# Patient Record
Sex: Male | Born: 1935
Health system: Southern US, Community
[De-identification: ages and names within clinical notes are randomized; demographics above are authoritative.]

## PROBLEM LIST (undated history)

## (undated) DIAGNOSIS — R5383 Other fatigue: Secondary | ICD-10-CM

## (undated) DIAGNOSIS — I10 Essential (primary) hypertension: Secondary | ICD-10-CM

## (undated) DIAGNOSIS — D509 Iron deficiency anemia, unspecified: Secondary | ICD-10-CM

## (undated) DIAGNOSIS — J302 Other seasonal allergic rhinitis: Secondary | ICD-10-CM

## (undated) DIAGNOSIS — H905 Unspecified sensorineural hearing loss: Secondary | ICD-10-CM

## (undated) DIAGNOSIS — N400 Enlarged prostate without lower urinary tract symptoms: Secondary | ICD-10-CM

## (undated) DIAGNOSIS — M1990 Unspecified osteoarthritis, unspecified site: Secondary | ICD-10-CM

## (undated) DIAGNOSIS — N529 Male erectile dysfunction, unspecified: Secondary | ICD-10-CM

## (undated) DIAGNOSIS — C44212 Basal cell carcinoma of skin of right ear and external auricular canal: Secondary | ICD-10-CM

## (undated) DIAGNOSIS — N200 Calculus of kidney: Secondary | ICD-10-CM

## (undated) DIAGNOSIS — Z8659 Personal history of other mental and behavioral disorders: Secondary | ICD-10-CM

## (undated) DIAGNOSIS — Z860101 Personal history of adenomatous and serrated colon polyps: Secondary | ICD-10-CM

## (undated) DIAGNOSIS — I480 Paroxysmal atrial fibrillation: Secondary | ICD-10-CM

## (undated) DIAGNOSIS — Z86010 Personal history of colonic polyps: Secondary | ICD-10-CM

## (undated) HISTORY — DX: Personal history of colonic polyps: Z86.010

## (undated) HISTORY — DX: Unspecified sensorineural hearing loss: H90.5

## (undated) HISTORY — DX: Personal history of adenomatous and serrated colon polyps: Z86.0101

## (undated) HISTORY — DX: Other seasonal allergic rhinitis: J30.2

## (undated) HISTORY — DX: Essential (primary) hypertension: I10

## (undated) HISTORY — DX: Unspecified osteoarthritis, unspecified site: M19.90

## (undated) HISTORY — PX: TONSILLECTOMY AND ADENOIDECTOMY: SHX28

## (undated) HISTORY — PX: OTHER SURGICAL HISTORY: SHX169

## (undated) HISTORY — DX: Benign prostatic hyperplasia without lower urinary tract symptoms: N40.0

## (undated) HISTORY — DX: Personal history of other mental and behavioral disorders: Z86.59

## (undated) HISTORY — PX: CHOLECYSTECTOMY: SHX55

## (undated) HISTORY — DX: Iron deficiency anemia, unspecified: D50.9

## (undated) HISTORY — DX: Basal cell carcinoma of skin of right ear and external auricular canal: C44.212

## (undated) HISTORY — DX: Calculus of kidney: N20.0

## (undated) HISTORY — DX: Other fatigue: R53.83

## (undated) HISTORY — DX: Male erectile dysfunction, unspecified: N52.9

---

## 1999-07-04 ENCOUNTER — Encounter: Payer: Self-pay | Admitting: Internal Medicine

## 1999-07-04 ENCOUNTER — Encounter: Admission: RE | Admit: 1999-07-04 | Discharge: 1999-07-04 | Payer: Self-pay | Admitting: Internal Medicine

## 2002-07-21 ENCOUNTER — Ambulatory Visit (HOSPITAL_BASED_OUTPATIENT_CLINIC_OR_DEPARTMENT_OTHER): Admission: RE | Admit: 2002-07-21 | Discharge: 2002-07-21 | Payer: Self-pay | Admitting: Surgery

## 2006-08-24 ENCOUNTER — Encounter: Admission: RE | Admit: 2006-08-24 | Discharge: 2006-08-24 | Payer: Self-pay | Admitting: Internal Medicine

## 2006-08-31 ENCOUNTER — Encounter: Admission: RE | Admit: 2006-08-31 | Discharge: 2006-08-31 | Payer: Self-pay | Admitting: Internal Medicine

## 2015-07-16 ENCOUNTER — Encounter: Payer: Self-pay | Admitting: Cardiology

## 2015-07-16 ENCOUNTER — Ambulatory Visit (INDEPENDENT_AMBULATORY_CARE_PROVIDER_SITE_OTHER): Payer: Medicare Other | Admitting: Cardiology

## 2015-07-16 VITALS — BP 120/64 | HR 84 | Ht 73.0 in | Wt 208.0 lb

## 2015-07-16 DIAGNOSIS — I10 Essential (primary) hypertension: Secondary | ICD-10-CM | POA: Diagnosis not present

## 2015-07-16 DIAGNOSIS — I480 Paroxysmal atrial fibrillation: Secondary | ICD-10-CM | POA: Insufficient documentation

## 2015-07-16 DIAGNOSIS — R079 Chest pain, unspecified: Secondary | ICD-10-CM | POA: Insufficient documentation

## 2015-07-16 DIAGNOSIS — E7800 Pure hypercholesterolemia, unspecified: Secondary | ICD-10-CM | POA: Diagnosis not present

## 2015-07-16 DIAGNOSIS — M1990 Unspecified osteoarthritis, unspecified site: Secondary | ICD-10-CM | POA: Insufficient documentation

## 2015-07-16 DIAGNOSIS — R0789 Other chest pain: Secondary | ICD-10-CM

## 2015-07-16 DIAGNOSIS — E785 Hyperlipidemia, unspecified: Secondary | ICD-10-CM | POA: Insufficient documentation

## 2015-07-16 NOTE — Patient Instructions (Addendum)
Medication Instructions:  STOP ASPIRIN   Labwork: NONE  Testing/Procedures: Your physician has requested that you have an echocardiogram. Echocardiography is a painless test that uses sound waves to create images of your heart. It provides your doctor with information about the size and shape of your heart and how well your heart's chambers and valves are working. This procedure takes approximately one hour. There are no restrictions for this procedure.  Your physician has requested that you have a lexiscan myoview. For further information please visit [email or web address]. Please follow instruction sheet, as given.  A chest x-ray takes a picture of the organs and structures inside the chest, including the heart, lungs, and blood vessels. This test can show several things, including, whether the heart is enlarges; whether fluid is building up in the lungs; and whether pacemaker / defibrillator leads are still in place.  Follow-Up: Your physician recommends that you schedule a follow-up appointment in: [place] PA/NP  If you need a refill on your cardiac medications before your next appointment, please call your pharmacy.

## 2015-07-16 NOTE — Progress Notes (Signed)
Cardiology Office Note   Date:  07/16/2015   ID:  Kannen, Peirson 1934/11/10, MRN [id number]  PCP:  Lillia Mountain, MD  Cardiologist: Cassell Clement MD  Chief Complaint  Patient presents with  . New Evaluation    new onset a fib      History of Present Illness: Corey [name] is a 81 y.o. male who presents for cardiology evaluation.  He is seen by me for the first time today.  He is seen at the request of Dr. Kirby Funk.  The patient is being seen because of atrial fibrillation of unknown duration.  The patient was seen for a physical in November 2016 by Dr. Valentina Lucks.  No EKG was done at that time according to the patient, but no mention of atrial fibrillation or irregular pulse wasn't mentioned.  Subsequently had a nurse practitioner house call about a week ago who performed a physical examination on him at his home.  She noted an irregular pulse.  [name] office was notified and he was seen in Dr. Jone Baseman office on 07/14/15 by Ida Rogue, NP, who obtained an electrocardiogram and confirmed the presence of atrial fibrillation. The patient himself is really not aware of his heart rate.  He does not know how long he may have been in the atrial fibrillation.  He began experiencing fatigue several years ago and because of that stop playing golf about 2 years ago.  He would give out after playing about 12 holes of golf.  He has not had any symptoms of congestive heart failure.  He sleeps on one pillow.  He does not have any difficulty climbing stairs.  He has had a sense of vague chest tightness at times.  There is no radiation to the arms.  The past 6 months the patient has also had occasional night sweats which are new. The patient has had some aching in the muscles of his legs.  He is on statin therapy. He has not had any TIA symptoms.  He has not had any dizziness or syncope.    Past Medical History  Diagnosis Date  . A-fib (HCC)   . SOB (shortness of  breath)   . Fatigue   . Hypertension   . BPH (benign prostatic hyperplasia)   . ED (erectile dysfunction)   . Nephrolithiasis   . Hx of major depression   . Iron deficiency anemia   . Hx of adenomatous colonic polyps   . Basal cell carcinoma of auricle of right ear   . Hearing loss, sensorineural   . DJD (degenerative joint disease)     left  . Seasonal allergic rhinitis   . Irregular heartbeat     Past Surgical History  Procedure Laterality Date  . Tonsillectomy and adenoidectomy    . Herniorraphy Bilateral     had surgery x2 on the left  and right  . Cholecystectomy    . Colonscopy       Current Outpatient Prescriptions  Medication Sig Dispense Refill  . amLODipine-atorvastatin (CADUET) 5-20 MG tablet Take 1 tablet by mouth daily.    [name] apixaban (ELIQUIS) 5 MG TABS tablet Take 5 mg by mouth 2 (two) times daily.    [name] atorvastatin (LIPITOR) 20 MG tablet Take 20 mg by mouth daily.    . cholecalciferol (VITAMIN D) 1000 units tablet Take 1,000 Units by mouth daily.    . Multiple Vitamin (MULTIVITAMIN) tablet Take 1 tablet by mouth daily. Take once a day  No current facility-administered medications for this visit.    Allergies:   Review of patient's allergies indicates no known allergies.    Social History:  The patient  reports that he quit smoking about 55 years ago. His smoking use included Cigarettes. He does not have any smokeless tobacco history on file. He reports that he does not drink alcohol.   Family History:  The patient's family history includes Alcoholism in his daughter; Arrhythmia in his daughter; CAD in his mother; Cancer - Prostate in his brother; Healthy in his daughter and son; Heart attack in his father; Heart disease in his mother. history father died of a heart attack at age 102.  Mother died of postoperative hemorrhage after heart surgery at age 44   ROS:  Please see the history of present illness.   Otherwise, review of systems are positive for  none.   All other systems are reviewed and negative.    PHYSICAL EXAM: VS:  BP 120/64 mmHg  Pulse 84  Ht 6\' 1"  (1.854 m)  Wt 208 lb (94.348 kg)  BMI 27.45 kg/m2 , BMI Body mass index is 27.45 kg/(m^2). GEN: Well nourished, well developed, in no acute distress HEENT: normal Neck: no JVD, carotid bruits, or masses Cardiac: Irregularly irregular no murmurs, rubs, or gallops,no edema  Respiratory:  clear to auscultation bilaterally, normal work of breathing GI: soft, nontender, nondistended, + BS MS: no deformity or atrophy Skin: warm and dry, no rash Neuro:  Strength and sensation are intact Psych: euthymic mood, full affect   EKG:  EKG is ordered today. The ekg ordered today demonstrates atrial fibrillation with controlled ventricular response.  Heart rate is 85 bpm.  No ischemic changes.   Recent Labs: No results found for requested labs within last 365 days.    Lipid Panel No results found for: CHOL, TRIG, HDL, CHOLHDL, VLDL, LDLCALC, LDLDIRECT    Wt Readings from Last 3 Encounters:  07/16/15 208 lb (94.348 kg)       ASSESSMENT AND PLAN:  1.  Atrial fibrillation of unknown duration.  Possibly months.  Essentially asymptomatic and patient really not aware of his heart rhythm.  However he has had some decrease in his overall sense of well-being and stamina over the past year or 2. This patients CHA2DS2-VASc Score is 3 for hypertension and age. Above score calculated as 1 point each if present [CHF, HTN, DM, Vascular=MI/PAD/Aortic Plaque, Age if 65-74, or Male] Above score calculated as 2 points each if present [Age > 75, or Stroke/TIA/TE]  2.  Occasional night sweats over the past 6 months, undetermined etiology 3.  Essential hypertension 4.  History of hypercholesterolemia   Current medicines are reviewed at length with the patient today.  The patient does not have concerns regarding medicines.  The following changes have been made:  no change  Labs/ tests  ordered today include:   Orders Placed This Encounter  Procedures  . Myocardial Perfusion Imaging  . EKG 12-Lead  . ECHOCARDIOGRAM COMPLETE     Disposition:   We will continue him on the Apixaban 5 mg twice a day that has already been started.  He will stop his baby aspirin.  We will get a chest x-ray to evaluate heart size and incidentally to evaluate his complaint of night sweats.  We will have him return for a Lexiscan Myoview to evaluate his chest discomfort and rule out ischemic disease particularly in view of his family history.  He will also get a two-dimensional echocardiogram.  At this point, we will pursue a strategy of rate control and anticoagulation.  His rate is well controlled without requiring the addition of any beta blocker or calcium channel blocker at this point. He apparently is on both plain  atorvastatin 20 mg daily and Caduet 5-20 mg daily.  He does complain of some leg cramping and questionable muscle aching.  I will defer to Dr. Kirby Funk as to whether his statin therapy could be reduced. Following my retirement the patient will be seen by Dr. Rennis [name] in mid-March. Many thanks for the opportunity to see this pleasant gentleman with you.  Corey Schwalbe MD 07/16/2015 5:52 PM    [facility] [address] [place], [place], [place]  [address] Phone: [phone]; Fax: [phone]

## 2015-07-22 ENCOUNTER — Ambulatory Visit
Admission: RE | Admit: 2015-07-22 | Discharge: 2015-07-22 | Disposition: A | Discharge status: Home / Self Care | Payer: Medicare Other | Source: Ambulatory Visit | Attending: Cardiology | Admitting: Cardiology

## 2015-07-22 DIAGNOSIS — I480 Paroxysmal atrial fibrillation: Secondary | ICD-10-CM

## 2015-07-26 ENCOUNTER — Telehealth (HOSPITAL_COMMUNITY): Payer: Self-pay | Admitting: *Deleted

## 2015-07-26 NOTE — Telephone Encounter (Signed)
Patient given detailed instructions per Myocardial Perfusion Study Information Sheet for the test on 3/1/17Patient notified to arrive 15 minutes early and that it is imperative to arrive on time for appointment to keep from having the test rescheduled.  If you need to cancel or reschedule your appointment, please call the office within 24 hours of your appointment. Failure to do so may result in a cancellation of your appointment, and a $50 no show fee. Patient verbalized understanding. Nikholas Geffre J Cheryal Salas, RN  

## 2015-07-28 ENCOUNTER — Ambulatory Visit (HOSPITAL_BASED_OUTPATIENT_CLINIC_OR_DEPARTMENT_OTHER): Payer: Medicare Other

## 2015-07-28 ENCOUNTER — Ambulatory Visit (HOSPITAL_COMMUNITY): Payer: Medicare Other | Attending: Cardiology

## 2015-07-28 ENCOUNTER — Other Ambulatory Visit: Payer: Self-pay

## 2015-07-28 DIAGNOSIS — I4891 Unspecified atrial fibrillation: Secondary | ICD-10-CM | POA: Diagnosis present

## 2015-07-28 DIAGNOSIS — I358 Other nonrheumatic aortic valve disorders: Secondary | ICD-10-CM | POA: Insufficient documentation

## 2015-07-28 DIAGNOSIS — I340 Nonrheumatic mitral (valve) insufficiency: Secondary | ICD-10-CM | POA: Insufficient documentation

## 2015-07-28 DIAGNOSIS — R5383 Other fatigue: Secondary | ICD-10-CM | POA: Insufficient documentation

## 2015-07-28 DIAGNOSIS — I480 Paroxysmal atrial fibrillation: Secondary | ICD-10-CM | POA: Diagnosis not present

## 2015-07-28 DIAGNOSIS — R0789 Other chest pain: Secondary | ICD-10-CM | POA: Insufficient documentation

## 2015-07-28 DIAGNOSIS — I119 Hypertensive heart disease without heart failure: Secondary | ICD-10-CM | POA: Diagnosis not present

## 2015-07-28 DIAGNOSIS — R079 Chest pain, unspecified: Secondary | ICD-10-CM

## 2015-07-28 DIAGNOSIS — I071 Rheumatic tricuspid insufficiency: Secondary | ICD-10-CM | POA: Insufficient documentation

## 2015-07-28 LAB — MYOCARDIAL PERFUSION IMAGING
CHL CUP NUCLEAR SSS: 8
CSEPPHR: 100 {beats}/min
LHR: 0.47
LV dias vol: 112 mL
LVSYSVOL: 56 mL
NUC STRESS TID: 1
Rest HR: 77 {beats}/min
SDS: 1
SRS: 7

## 2015-07-28 MED ORDER — TECHNETIUM TC 99M SESTAMIBI GENERIC - CARDIOLITE
32.6000 | Freq: Once | INTRAVENOUS | Status: AC | PRN
  Administered 2015-07-28: 33 via INTRAVENOUS

## 2015-07-28 MED ORDER — REGADENOSON 0.4 MG/5ML IV SOLN
0.4000 mg | Freq: Once | INTRAVENOUS | Status: AC
  Administered 2015-07-28: 0.4 mg via INTRAVENOUS

## 2015-07-28 MED ORDER — TECHNETIUM TC 99M SESTAMIBI GENERIC - CARDIOLITE
10.6000 | Freq: Once | INTRAVENOUS | Status: AC | PRN
  Administered 2015-07-28: 11 via INTRAVENOUS

## 2015-08-02 ENCOUNTER — Other Ambulatory Visit (HOSPITAL_COMMUNITY): Payer: Medicare Other

## 2015-08-02 ENCOUNTER — Encounter (HOSPITAL_COMMUNITY): Payer: Medicare Other

## 2015-08-11 ENCOUNTER — Ambulatory Visit (INDEPENDENT_AMBULATORY_CARE_PROVIDER_SITE_OTHER): Payer: Medicare Other | Admitting: Internal Medicine

## 2015-08-11 ENCOUNTER — Encounter: Payer: Self-pay | Admitting: Internal Medicine

## 2015-08-11 VITALS — BP 122/64 | HR 75 | Ht 72.0 in | Wt 209.0 lb

## 2015-08-11 DIAGNOSIS — I10 Essential (primary) hypertension: Secondary | ICD-10-CM

## 2015-08-11 DIAGNOSIS — Z01812 Encounter for preprocedural laboratory examination: Secondary | ICD-10-CM

## 2015-08-11 DIAGNOSIS — R079 Chest pain, unspecified: Secondary | ICD-10-CM

## 2015-08-11 DIAGNOSIS — R0789 Other chest pain: Secondary | ICD-10-CM

## 2015-08-11 DIAGNOSIS — R5383 Other fatigue: Secondary | ICD-10-CM

## 2015-08-11 DIAGNOSIS — D689 Coagulation defect, unspecified: Secondary | ICD-10-CM | POA: Diagnosis not present

## 2015-08-11 DIAGNOSIS — I4819 Other persistent atrial fibrillation: Secondary | ICD-10-CM

## 2015-08-11 DIAGNOSIS — I480 Paroxysmal atrial fibrillation: Secondary | ICD-10-CM | POA: Diagnosis not present

## 2015-08-11 DIAGNOSIS — I481 Persistent atrial fibrillation: Secondary | ICD-10-CM

## 2015-08-11 LAB — CBC
HEMATOCRIT: 41.5 % (ref 39.0–52.0)
HEMOGLOBIN: 13.7 g/dL (ref 13.0–17.0)
MCH: 28.7 pg (ref 26.0–34.0)
MCHC: 33 g/dL (ref 30.0–36.0)
MCV: 87 fL (ref 78.0–100.0)
MPV: 8.7 fL (ref 8.6–12.4)
Platelets: 317 10*3/uL (ref 150–400)
RBC: 4.77 MIL/uL (ref 4.22–5.81)
RDW: 14.8 % (ref 11.5–15.5)
WBC: 7.4 10*3/uL (ref 4.0–10.5)

## 2015-08-11 LAB — BASIC METABOLIC PANEL
BUN: 17 mg/dL (ref 7–25)
CHLORIDE: 101 mmol/L (ref 98–110)
CO2: 31 mmol/L (ref 20–31)
CREATININE: 0.91 mg/dL (ref 0.70–1.18)
Calcium: 9.5 mg/dL (ref 8.6–10.3)
GLUCOSE: 122 mg/dL — AB (ref 65–99)
Potassium: 4.2 mmol/L (ref 3.5–5.3)
Sodium: 139 mmol/L (ref 135–146)

## 2015-08-11 NOTE — Patient Instructions (Addendum)
Your physician has recommended that you have a Cardioversion (DCCV) - 3/17, 3/20. Electrical Cardioversion uses a jolt of electricity to your heart either through paddles or wired patches attached to your chest. This is a controlled, usually prescheduled, procedure. Defibrillation is done under light anesthesia in the hospital, and you usually go home the day of the procedure. This is done to get your heart back into a normal rhythm. You are not awake for the procedure. Please see the instruction sheet given to you today.  -- you need to have lab work done prior to the procedure (no more than 1 week prior to your procedure).   Your physician recommends that you schedule a follow-up appointment in 2-3 weeks after your cardioversion with Dr. Debara Pickett  STOP atorvastatin  CONTINUE amlodipine-atorvastatin (caduet)

## 2015-08-11 NOTE — Progress Notes (Signed)
Cardiology Office Note   Date:  08/11/2015   ID:  Abdul, Shiver 04-Dec-1934, MRN [id number]  PCP:  Lillia Mountain, MD  Cardiologist: Cassell Clement MD  Chief Complaint  Patient presents with  . New Evaluation    patient of Dr. Patty Sermons - f/up echo/stress/cxr. NO COMPLAINTS.      History of Present Illness: TORRIN KALICKI is a 81 y.o. male who presents for cardiology evaluation.  He is seen by me for the first time today.  He is seen at the request of Dr. Kirby Funk.  The patient is being seen because of atrial fibrillation of unknown duration.  The patient was seen for a physical in November 2016 by Dr. Valentina Lucks.  No EKG was done at that time according to the patient, but no mention of atrial fibrillation or irregular pulse wasn't mentioned.  Subsequently had a nurse practitioner house call about a week ago who performed a physical examination on him at his home.  She noted an irregular pulse.  [name] office was notified and he was seen in Dr. Jone Baseman office on 07/14/15 by Ida Rogue, NP, who obtained an electrocardiogram and confirmed the presence of atrial fibrillation. The patient himself is really not aware of his heart rate.  He does not know how long he may have been in the atrial fibrillation.  He began experiencing fatigue several years ago and because of that stop playing golf about 2 years ago.  He would give out after playing about 12 holes of golf.  He has not had any symptoms of congestive heart failure.  He sleeps on one pillow.  He does not have any difficulty climbing stairs.  He has had a sense of vague chest tightness at times.  There is no radiation to the arms.  The past 6 months the patient has also had occasional night sweats which are new. The patient has had some aching in the muscles of his legs.  He is on statin therapy. He has not had any TIA symptoms.  He has not had any dizziness or syncope.  Mr. Giannini is seen in the  office today is a new patient to me however he was recently seen by Dr. Patty Sermons. Unfortunately Dr. Patty Sermons just retired and as Mr. Dixon wife is my patient, he is kindly requested my services. This or Hallak says over the past year he's had some worsening fatigue and decreased energy levels. In fact she's given up a lot of activities including golf which he enjoyed playing. He thought it was just related to being older. He also stopped doing a lot of lawn work and other activities. Recently had a home physical where he was found to be out of rhythm and then had an EKG which demonstrated atrial fibrillation. He was started on Eliquis and was seen by Dr. Patty Sermons. An echocardiogram and stress test were ordered. Both studies showed low normal LVEF of 50-55% however the nuclear stress test showed no ischemia. He had only mild left atrial enlargement on his echocardiogram. Apparently Dr. Patty Sermons had discussed the possibility of a cardioversion with him but had recommended at least 1 month of anticoagulation. Today is his 28th day of Eliquis. He has not missed any doses. EKG shows persistent atrial fibrillation.  Past Medical History  Diagnosis Date  . A-fib (HCC)   . SOB (shortness of breath)   . Fatigue   . Hypertension   . BPH (benign prostatic hyperplasia)   . ED (erectile  dysfunction)   . Nephrolithiasis   . Hx of major depression   . Iron deficiency anemia   . Hx of adenomatous colonic polyps   . Basal cell carcinoma of auricle of right ear   . Hearing loss, sensorineural   . DJD (degenerative joint disease)     left  . Seasonal allergic rhinitis   . Irregular heartbeat     Past Surgical History  Procedure Laterality Date  . Tonsillectomy and adenoidectomy    . Herniorraphy Bilateral     had surgery x2 on the left  and right  . Cholecystectomy    . Colonscopy       Current Outpatient Prescriptions  Medication Sig Dispense Refill  . amLODipine-atorvastatin  (CADUET) 5-20 MG tablet Take 1 tablet by mouth daily.    [name] apixaban (ELIQUIS) 5 MG TABS tablet Take 5 mg by mouth 2 (two) times daily.    . cholecalciferol (VITAMIN D) 1000 units tablet Take 1,000 Units by mouth daily.    . Multiple Vitamin (MULTIVITAMIN) tablet Take 1 tablet by mouth daily. Take once a day     No current facility-administered medications for this visit.    Allergies:   Review of patient's allergies indicates no known allergies.    Social History:  The patient  reports that he quit smoking about 55 years ago. His smoking use included Cigarettes. He does not have any smokeless tobacco history on file. He reports that he does not drink alcohol.   Family History:  The patient's family history includes Alcoholism in his daughter; Arrhythmia in his daughter; CAD in his mother; Cancer - Prostate in his brother; Healthy in his daughter and son; Heart attack in his father; Heart disease in his mother. history father died of a heart attack at age 16.  Mother died of postoperative hemorrhage after heart surgery at age 40   ROS:  Please see the history of present illness.   Otherwise, review of systems are positive for none.   All other systems are reviewed and negative.    PHYSICAL EXAM: VS:  BP 122/64 mmHg  Pulse 75  Ht 6' (1.829 m)  Wt 209 lb (94.802 kg)  BMI 28.34 kg/m2 , BMI Body mass index is 28.34 kg/(m^2). GEN: Well nourished, well developed, in no acute distress HEENT: normal Neck: no JVD, carotid bruits, or masses Cardiac: Irregularly irregular no murmurs, rubs, or gallops,no edema  Respiratory:  clear to auscultation bilaterally, normal work of breathing GI: soft, nontender, nondistended, + BS MS: no deformity or atrophy Skin: warm and dry, no rash Neuro:  Strength and sensation are intact Psych: euthymic mood, full affect   EKG:  Atrial fibrillation at 94  Recent Labs: No results found for requested labs within last 365 days.    Lipid Panel No results  found for: CHOL, TRIG, HDL, CHOLHDL, VLDL, LDLCALC, LDLDIRECT    Wt Readings from Last 3 Encounters:  08/11/15 209 lb (94.802 kg)  07/28/15 208 lb (94.348 kg)  07/16/15 208 lb (94.348 kg)    ASSESSMENT AND PLAN:  1.  Atrial fibrillation of unknown duration.  Possibly months.  Essentially asymptomatic and patient really not aware of his heart rhythm.  However he has had some decrease in his overall sense of well-being and stamina over the past year or 2. This patients CHA2DS2-VASc Score is 3 for hypertension and age. Above score calculated as 1 point each if present [CHF, HTN, DM, Vascular=MI/PAD/Aortic Plaque, Age if 30-74, or Male] Above score  calculated as 2 points each if present [Age > 75, or Stroke/TIA/TE] 2.  Occasional night sweats over the past 6 months, undetermined etiology 3.  Essential hypertension 4.  History of hypercholesterolemia   Orders Placed This Encounter  Procedures  . ELECTRICAL CARDIOVERSION  . CBC  . Basic metabolic panel  . APTT  . Protime-INR  . TSH  . EKG 12-Lead   PLAN:    1. Mr. Visaya is in persistent atrial fibrillation and he seems to be symptomatic with this. He's given up a lot of activities it would normally do which I think is related to his A. fib. His nuclear stress test is reassuring and is nonischemic however he does have low normal LVEF both on echo and nuclear stress testing. This is likely related to persistent A. fib. I do think he deserves an opportunity to get back into sinus rhythm. He's been on Eliquis now for one month and is interested in a cardioversion. I did explain the risks, benefits and alternatives for that procedure today with him in the office and he is agreeable to proceed. We'll try to schedule that later this week. He is advised not to miss any doses of Eliquis.  Follow-up with me after cardioversion.  Chrystie Nose, MD, [facility] Attending Cardiologist [facility]  08/11/2015 6:22 PM

## 2015-08-12 ENCOUNTER — Telehealth: Payer: Self-pay | Admitting: Internal Medicine

## 2015-08-12 LAB — PROTIME-INR
INR: 1.1 (ref <1.50)
Prothrombin Time: 14.3 seconds (ref 11.6–15.2)

## 2015-08-12 LAB — TSH: TSH: 1.92 mIU/L (ref 0.40–4.50)

## 2015-08-12 LAB — APTT: aPTT: 33 seconds (ref 24–37)

## 2015-08-12 NOTE — Telephone Encounter (Signed)
Please call,question about his medicine. °

## 2015-08-12 NOTE — Telephone Encounter (Signed)
I will straighten it out tomorrow at his cardioversion. Please advise him to bring his medication bottles to the hospital.  Dr. Lemmie Evens

## 2015-08-12 NOTE — Telephone Encounter (Signed)
Spoke to patient   patient states he checked medication against the AVS MEDICATION LIST  Patient states he is taking Amlodipine- benazepril 5-20 mg not  Amlodipine-atorvastatin 5-20 mg Patient wanted to know if these medications were the same.  RN informed patient the medication were not the same.  It seems the incorrect medication may have been listed at a prior office visit.   Patient probably does not need to change current medications -Amoldipine-beneazepril--   Will defer to Dr Debara Pickett, if patient  needs to have medication change.

## 2015-08-12 NOTE — Telephone Encounter (Signed)
PATIENT VERBALIZED UNDERSTANDING 

## 2015-08-13 ENCOUNTER — Ambulatory Visit (HOSPITAL_COMMUNITY): Payer: Medicare Other | Admitting: Anesthesiology

## 2015-08-13 ENCOUNTER — Encounter (HOSPITAL_COMMUNITY): Payer: Self-pay

## 2015-08-13 ENCOUNTER — Ambulatory Visit (HOSPITAL_COMMUNITY)
Admission: RE | Admit: 2015-08-13 | Discharge: 2015-08-13 | Disposition: A | Discharge status: Home / Self Care | Payer: Medicare Other | Source: Ambulatory Visit | Attending: Internal Medicine | Admitting: Internal Medicine

## 2015-08-13 ENCOUNTER — Encounter (HOSPITAL_COMMUNITY)
Admission: RE | Disposition: A | Discharge status: Home / Self Care | Payer: Self-pay | Source: Ambulatory Visit | Attending: Internal Medicine

## 2015-08-13 DIAGNOSIS — Z87891 Personal history of nicotine dependence: Secondary | ICD-10-CM | POA: Diagnosis not present

## 2015-08-13 DIAGNOSIS — I10 Essential (primary) hypertension: Secondary | ICD-10-CM | POA: Insufficient documentation

## 2015-08-13 DIAGNOSIS — Z7901 Long term (current) use of anticoagulants: Secondary | ICD-10-CM | POA: Insufficient documentation

## 2015-08-13 DIAGNOSIS — I4891 Unspecified atrial fibrillation: Secondary | ICD-10-CM | POA: Insufficient documentation

## 2015-08-13 DIAGNOSIS — I481 Persistent atrial fibrillation: Secondary | ICD-10-CM

## 2015-08-13 DIAGNOSIS — Z79899 Other long term (current) drug therapy: Secondary | ICD-10-CM | POA: Insufficient documentation

## 2015-08-13 DIAGNOSIS — I4819 Other persistent atrial fibrillation: Secondary | ICD-10-CM | POA: Insufficient documentation

## 2015-08-13 HISTORY — PX: CARDIOVERSION: SHX1299

## 2015-08-13 SURGERY — CARDIOVERSION
Anesthesia: General

## 2015-08-13 MED ORDER — SODIUM CHLORIDE 0.9 % IV SOLN
INTRAVENOUS | Status: DC
Start: 2015-08-13 — End: 2015-08-13
  Administered 2015-08-13: 13:00:00 via INTRAVENOUS

## 2015-08-13 MED ORDER — LIDOCAINE HCL (CARDIAC) 20 MG/ML IV SOLN
INTRAVENOUS | Status: DC | PRN
  Administered 2015-08-13: 60 mg via INTRATRACHEAL

## 2015-08-13 MED ORDER — PROPOFOL 10 MG/ML IV BOLUS
INTRAVENOUS | Status: DC | PRN
  Administered 2015-08-13: 10 mg via INTRAVENOUS
  Administered 2015-08-13: 60 mg via INTRAVENOUS

## 2015-08-13 NOTE — Anesthesia Postprocedure Evaluation (Signed)
Anesthesia Post Note  Patient: Corey [name]  Procedure(s) Performed: Procedure(s) (LRB): CARDIOVERSION (N/A)  Patient location during evaluation: PACU Anesthesia Type: General Level of consciousness: sedated and patient cooperative Pain management: pain level controlled Vital Signs Assessment: post-procedure vital signs reviewed and stable Respiratory status: spontaneous breathing Cardiovascular status: stable Anesthetic complications: no    Last Vitals:  Filed Vitals:   08/13/15 1440 08/13/15 1450  BP: 130/71 130/71  Pulse: 66 71  Temp:    Resp: 18 19    Last Pain: There were no vitals filed for this visit.               Nolon Nations

## 2015-08-13 NOTE — Anesthesia Preprocedure Evaluation (Signed)
Anesthesia Evaluation  Patient identified by MRN, date of birth, ID band Patient awake    Reviewed: Allergy & Precautions, NPO status , Patient's Chart, lab work & pertinent test results  Airway Mallampati: II  TM Distance: >3 FB Neck ROM: Full    Dental no notable dental hx.    Pulmonary shortness of breath, former smoker,    Pulmonary exam normal breath sounds clear to auscultation       Cardiovascular hypertension, Pt. on medications Normal cardiovascular exam Rhythm:Regular Rate:Normal     Neuro/Psych negative neurological ROS  negative psych ROS   GI/Hepatic negative GI ROS, Neg liver ROS,   Endo/Other  negative endocrine ROS  Renal/GU Renal disease  negative genitourinary   Musculoskeletal negative musculoskeletal ROS (+) Arthritis ,   Abdominal   Peds negative pediatric ROS (+)  Hematology  (+) anemia ,   Anesthesia Other Findings   Reproductive/Obstetrics negative OB ROS                             Anesthesia Physical Anesthesia Plan  ASA: III  Anesthesia Plan: General   Post-op Pain Management:    Induction: Intravenous  Airway Management Planned: Mask  Additional Equipment:   Intra-op Plan:   Post-operative Plan:   Informed Consent: I have reviewed the patients History and Physical, chart, labs and discussed the procedure including the risks, benefits and alternatives for the proposed anesthesia with the patient or authorized representative who has indicated his/her understanding and acceptance.   Dental advisory given  Plan Discussed with: CRNA  Anesthesia Plan Comments:         Anesthesia Quick Evaluation

## 2015-08-13 NOTE — CV Procedure (Signed)
    CARDIOVERSION NOTE  Procedure: Electrical Cardioversion Indications:  Atrial Fibrillation  Procedure Details:  Consent: Risks of procedure as well as the alternatives and risks of each were explained to the (patient/caregiver).  Consent for procedure obtained.  Time Out: Verified patient identification, verified procedure, site/side was marked, verified correct patient position, special equipment/implants available, medications/allergies/relevent history reviewed, required imaging and test results available.  Performed  Patient placed on cardiac monitor, pulse oximetry, supplemental oxygen as necessary.  Sedation given: Propofol per anesthesia Pacer pads placed anterior and posterior chest.  Cardioverted 2 time(s).  Cardioverted at 150J and 200J biphasic.  Impression: Findings: Post procedure EKG shows: NSR Complications: None Patient did tolerate procedure well.  Plan: 1. Successful DCCV to normal sinus rhythm after 2 shocks. 2. Continue Eliquis.  Time Spent Directly with the Patient:  30 minutes   Pixie Casino, MD, [facility] Attending Cardiologist [place] 08/13/2015, 2:18 PM

## 2015-08-13 NOTE — Discharge Instructions (Signed)
Electrical Cardioversion Electrical cardioversion is the delivery of a jolt of electricity to change the rhythm of the heart. Sticky patches or metal paddles are placed on the chest to deliver the electricity from a device. This is done to restore a normal rhythm. A rhythm that is too fast or not regular keeps the heart from pumping well. Electrical cardioversion is done in an emergency if:   There is low or no blood pressure as a result of the heart rhythm.   Normal rhythm must be restored as fast as possible to protect the brain and heart from further damage.   It may save a life. Cardioversion may be done for heart rhythms that are not immediately life threatening, such as atrial fibrillation or flutter, in which:   The heart is beating too fast or is not regular.   Medicine to change the rhythm has not worked.   It is safe to wait in order to allow time for preparation.  Symptoms of the abnormal rhythm are bothersome.  The risk of stroke and other serious problems can be reduced. LET [facility] CARE PROVIDER KNOW ABOUT:   Any allergies you have.  All medicines you are taking, including vitamins, herbs, eye drops, creams, and over-the-counter medicines.  Previous problems you or members of your family have had with the use of anesthetics.   Any blood disorders you have.   Previous surgeries you have had.   Medical conditions you have. RISKS AND COMPLICATIONS  Generally, this is a safe procedure. However, problems can occur and include:   Breathing problems related to the anesthetic used.  A blood clot that breaks free and travels to other parts of your body. This could cause a stroke or other problems. The risk of this is lowered by use of blood-thinning medicine (anticoagulant) prior to the procedure.  Cardiac arrest (rare). BEFORE THE PROCEDURE   You may have tests to detect blood clots in your heart and to evaluate heart function.  You may start taking  anticoagulants so your blood does not clot as easily.   Medicines may be given to help stabilize your heart rate and rhythm. PROCEDURE  You will be given medicine through an IV tube to reduce discomfort and make you sleepy (sedative).   An electrical shock will be delivered. AFTER THE PROCEDURE Your heart rhythm will be watched to make sure it does not change.    This information is not intended to replace advice given to you by your health care provider. Make sure you discuss any questions you have with your health care provider.   Document Released: 05/05/2002 Document Revised: 06/05/2014 Document Reviewed: 11/27/2012 Elsevier Interactive Patient Education 2016 [place] Monitored anesthesia care is an anesthesia service for a medical procedure. Anesthesia is the loss of the ability to feel pain. It is produced by medicines called anesthetics. It may affect a small area of your body (local anesthesia), a large area of your body (regional anesthesia), or your entire body (general anesthesia). The need for monitored anesthesia care depends your procedure, your condition, and the potential need for regional or general anesthesia. It is often provided during procedures where:   General anesthesia may be needed if there are complications. This is because you need special care when you are under general anesthesia.   You will be under local or regional anesthesia. This is so that you are able to have higher levels of anesthesia if needed.   You will  receive calming medicines (sedatives). This is especially the case if sedatives are given to put you in a semi-conscious state of relaxation (deep sedation). This is because the amount of sedative needed to produce this state can be hard to predict. Too much of a sedative can produce general anesthesia. Monitored anesthesia care is performed by one or more health care providers who have special training in all  types of anesthesia. You will need to meet with these health care providers before your procedure. During this meeting, they will ask you about your medical history. They will also give you instructions to follow. (For example, you will need to stop eating and drinking before your procedure. You may also need to stop or change medicines you are taking.) During your procedure, your health care providers will stay with you. They will:   Watch your condition. This includes watching your blood pressure, breathing, and level of pain.   Diagnose and treat problems that occur.   Give medicines if they are needed. These may include calming medicines (sedatives) and anesthetics.   Make sure you are comfortable.  Having monitored anesthesia care does not necessarily mean that you will be under anesthesia. It does mean that your health care providers will be able to manage anesthesia if you need it or if it occurs. It also means that you will be able to have a different type of anesthesia than you are having if you need it. When your procedure is complete, your health care providers will continue to watch your condition. They will make sure any medicines wear off before you are allowed to go home.    This information is not intended to replace advice given to you by your health care provider. Make sure you discuss any questions you have with your health care provider.   Document Released: 02/08/2005 Document Revised: 06/05/2014 Document Reviewed: 06/26/2012 Elsevier Interactive Patient Education [employer].

## 2015-08-13 NOTE — H&P (Signed)
     INTERVAL PROCEDURE H&P  History and Physical Interval Note:  08/13/2015 1:45 PM  Corey [name] has presented today for their planned procedure. The various methods of treatment have been discussed with the patient and family. After consideration of risks, benefits and other options for treatment, the patient has consented to the procedure.  The patients' outpatient history has been reviewed, patient examined, and no change in status from most recent office note within the past 30 days. I have reviewed the patients' chart and labs and will proceed as planned. Questions were answered to the patient's satisfaction.   Pixie Casino, MD, [facility] Attending Cardiologist [place] 08/13/2015, 1:45 PM

## 2015-08-13 NOTE — Transfer of Care (Signed)
Immediate Anesthesia Transfer of Care Note  Patient: Corey [name]  Procedure(s) Performed: Procedure(s): CARDIOVERSION (N/A)  Patient Location: Endoscopy Unit  Anesthesia Type:General  Level of Consciousness: awake, alert  and oriented  Airway & Oxygen Therapy: Patient Spontanous Breathing  Post-op Assessment: Report given to RN, Post -op Vital signs reviewed and stable and Patient moving all extremities X 4  Post vital signs: Reviewed and stable  Last Vitals:  Filed Vitals:   08/13/15 1320  BP: 145/94  Pulse: 79  Temp: 36.4 C  Resp: 21    Complications: No apparent anesthesia complications

## 2015-08-13 NOTE — Anesthesia Procedure Notes (Signed)
Date/Time: 08/13/2015 2:00 PM Performed by: Merrilyn Puma B Pre-anesthesia Checklist: Patient identified, Emergency Drugs available, Suction available, Patient being monitored and Timeout performed Patient Re-evaluated:Patient Re-evaluated prior to inductionOxygen Delivery Method: Circle system utilized Preoxygenation: Pre-oxygenation with 100% oxygen Intubation Type: IV induction Ventilation: Mask ventilation without difficulty Dental Injury: Teeth and Oropharynx as per pre-operative assessment

## 2015-08-14 ENCOUNTER — Encounter (HOSPITAL_COMMUNITY): Payer: Self-pay | Admitting: Internal Medicine

## 2015-08-27 ENCOUNTER — Encounter: Payer: Self-pay | Admitting: Internal Medicine

## 2015-08-27 ENCOUNTER — Ambulatory Visit (INDEPENDENT_AMBULATORY_CARE_PROVIDER_SITE_OTHER): Payer: Medicare Other | Admitting: Internal Medicine

## 2015-08-27 VITALS — BP 124/74 | HR 52 | Ht 72.0 in | Wt 207.5 lb

## 2015-08-27 DIAGNOSIS — I10 Essential (primary) hypertension: Secondary | ICD-10-CM

## 2015-08-27 DIAGNOSIS — I481 Persistent atrial fibrillation: Secondary | ICD-10-CM

## 2015-08-27 DIAGNOSIS — I480 Paroxysmal atrial fibrillation: Secondary | ICD-10-CM

## 2015-08-27 DIAGNOSIS — Z7901 Long term (current) use of anticoagulants: Secondary | ICD-10-CM | POA: Diagnosis not present

## 2015-08-27 DIAGNOSIS — I4819 Other persistent atrial fibrillation: Secondary | ICD-10-CM

## 2015-08-27 NOTE — Patient Instructions (Signed)
   Your physician wants you to follow-up in: [place]. You will receive a reminder letter in the mail two months in advance. If you don't receive a letter, please call our office to schedule the follow-up appointment.

## 2015-08-27 NOTE — Progress Notes (Signed)
Cardiology Office Note   Date:  08/27/2015   ID:  Messiah, Lozinski 30-Jan-1935, MRN [id number]  PCP:  Lillia Mountain, MD  Cardiologist: Cassell Clement MD  Chief Complaint  Patient presents with  . Follow-up    no chest pain, no shortness of breath,no edema, no pain or cramping in legs, no lightheadedness or dizziness, fatigue-after activity      History of Present Illness: JERMARIUS KOSA is a 81 y.o. male who presents for cardiology evaluation.  He is seen by me for the first time today.  He is seen at the request of Dr. Kirby Funk.  The patient is being seen because of atrial fibrillation of unknown duration.  The patient was seen for a physical in November 2016 by Dr. Valentina Lucks.  No EKG was done at that time according to the patient, but no mention of atrial fibrillation or irregular pulse wasn't mentioned.  Subsequently had a nurse practitioner house call about a week ago who performed a physical examination on him at his home.  She noted an irregular pulse.  [name]'s office was notified and he was seen in Dr. Jone Baseman office on 07/14/15 by Ida Rogue, NP, who obtained an electrocardiogram and confirmed the presence of atrial fibrillation. The patient himself is really not aware of his heart rate.  He does not know how long he may have been in the atrial fibrillation.  He began experiencing fatigue several years ago and because of that stop playing golf about 2 years ago.  He would give out after playing about 12 holes of golf.  He has not had any symptoms of congestive heart failure.  He sleeps on one pillow.  He does not have any difficulty climbing stairs.  He has had a sense of vague chest tightness at times.  There is no radiation to the arms.  The past 6 months the patient has also had occasional night sweats which are new. The patient has had some aching in the muscles of his legs.  He is on statin therapy. He has not had any TIA symptoms.  He has not had  any dizziness or syncope.  Mr. Croxford is seen in the office today is a new patient to me however he was recently seen by Dr. Patty Sermons. Unfortunately Dr. Patty Sermons just retired and as Mr. Lamke wife is my patient, he is kindly requested my services. This or Noguez says over the past year he's had some worsening fatigue and decreased energy levels. In fact she's given up a lot of activities including golf which he enjoyed playing. He thought it was just related to being older. He also stopped doing a lot of lawn work and other activities. Recently had a home physical where he was found to be out of rhythm and then had an EKG which demonstrated atrial fibrillation. He was started on Eliquis and was seen by Dr. Patty Sermons. An echocardiogram and stress test were ordered. Both studies showed low normal LVEF of 50-55% however the nuclear stress test showed no ischemia. He had only mild left atrial enlargement on his echocardiogram. Apparently Dr. Patty Sermons had discussed the possibility of a cardioversion with him but had recommended at least 1 month of anticoagulation. Today is his 28th day of Eliquis. He has not missed any doses. EKG shows persistent atrial fibrillation.  I saw Mr. Schrader back today in the office for follow-up. He underwent successful cardioversion to sinus rhythm and maintains that today. He notes that he's had  some improvement in his energy and was able to mow his lawn without having to stop. He started walking again and is doing about a mile a day. Generally seems to be thriving. Heart rate is noted to be low in the 50s but he noted that it was in the 50s for most of his life. He denies any chest pain, shortness of breath and as mentioned his energy level has improved. He seems to be tolerating Eliquis without any bleeding complications.  Past Medical History  Diagnosis Date  . A-fib (HCC)   . SOB (shortness of breath)   . Fatigue   . Hypertension   . BPH (benign  prostatic hyperplasia)   . ED (erectile dysfunction)   . Nephrolithiasis   . Hx of major depression   . Iron deficiency anemia   . Hx of adenomatous colonic polyps   . Basal cell carcinoma of auricle of right ear   . Hearing loss, sensorineural   . DJD (degenerative joint disease)     left  . Seasonal allergic rhinitis   . Irregular heartbeat     Past Surgical History  Procedure Laterality Date  . Tonsillectomy and adenoidectomy    . Herniorraphy Bilateral     had surgery x2 on the left  and right  . Cholecystectomy    . Colonscopy    . Cardioversion N/A 08/13/2015    Procedure: CARDIOVERSION;  Surgeon: Chrystie Nose, MD;  Location: [facility] ENDOSCOPY;  Service: Cardiovascular;  Laterality: N/A;     Current Outpatient Prescriptions  Medication Sig Dispense Refill  . amLODipine-benazepril (LOTREL) 5-20 MG capsule Take 1 capsule by mouth daily.    [name] apixaban (ELIQUIS) 5 MG TABS tablet Take 5 mg by mouth 2 (two) times daily.    [name] atorvastatin (LIPITOR) 20 MG tablet Take 20 mg by mouth daily.    . cholecalciferol (VITAMIN D) 1000 units tablet Take 1,000 Units by mouth daily.    . Multiple Vitamin (MULTIVITAMIN) tablet Take 1 tablet by mouth daily. Take once a day     No current facility-administered medications for this visit.    Allergies:   Review of patient's allergies indicates no known allergies.    Social History:  The patient  reports that he quit smoking about 55 years ago. His smoking use included Cigarettes. He does not have any smokeless tobacco history on file. He reports that he does not drink alcohol.   Family History:  The patient's family history includes Alcoholism in his daughter; Arrhythmia in his daughter; CAD in his mother; Cancer - Prostate in his brother; Healthy in his daughter and son; Heart attack in his father; Heart disease in his mother. history father died of a heart attack at age 40.  Mother died of postoperative hemorrhage after heart surgery at age  25   ROS:  Please see the history of present illness.   Otherwise, review of systems are positive for none.   All other systems are reviewed and negative.    PHYSICAL EXAM: VS:  BP 124/74 mmHg  Pulse 52  Ht 6' (1.829 m)  Wt 207 lb 8 oz (94.121 kg)  BMI 28.14 kg/m2 , BMI Body mass index is 28.14 kg/(m^2). GEN: Well nourished, well developed, in no acute distress HEENT: normal Neck: no JVD, carotid bruits, or masses Cardiac: Regular bradycardia Respiratory:  clear to auscultation bilaterally, normal work of breathing GI: soft, nontender, nondistended, + BS MS: no deformity or atrophy Skin: warm and dry, no rash  Neuro:  Strength and sensation are intact Psych: euthymic mood, full affect   EKG:  Sinus bradycardia 52  Recent Labs: 08/11/2015: BUN 17; Creat 0.91; Hemoglobin 13.7; Platelets 317; Potassium 4.2; Sodium 139; TSH 1.92    Lipid Panel No results found for: CHOL, TRIG, HDL, CHOLHDL, VLDL, LDLCALC, LDLDIRECT    Wt Readings from Last 3 Encounters:  08/27/15 207 lb 8 oz (94.121 kg)  08/13/15 210 lb (95.255 kg)  08/11/15 209 lb (94.802 kg)    ASSESSMENT AND PLAN:  1.  Atrial fibrillation - CHADSVASC score of 3 on Eliquis 2.  Essential hypertension 3.  History of hypercholesterolemia   No orders of the defined types were placed in this encounter.   PLAN:    Mr. Weinmann is doing well status post cardioversion. He is maintaining sinus bradycardia with improvement in energy. He has had no ill effects from his cardioversion. He is tolerating Eliquis without any bleeding complications. Given his CHADSVASC score of 3 we will continue this indefinitely. I'll plan to see Mr. Schwein back in 6 months or sooner as necessary.  Chrystie Nose, MD, [facility] Attending Cardiologist [facility]  08/27/2015 1:36 PM

## 2016-02-16 ENCOUNTER — Ambulatory Visit (INDEPENDENT_AMBULATORY_CARE_PROVIDER_SITE_OTHER): Payer: Medicare Other | Admitting: Internal Medicine

## 2016-02-16 ENCOUNTER — Encounter: Payer: Self-pay | Admitting: Internal Medicine

## 2016-02-16 VITALS — BP 136/76 | HR 47 | Ht 72.0 in | Wt 210.4 lb

## 2016-02-16 DIAGNOSIS — Z7901 Long term (current) use of anticoagulants: Secondary | ICD-10-CM

## 2016-02-16 DIAGNOSIS — E7800 Pure hypercholesterolemia, unspecified: Secondary | ICD-10-CM

## 2016-02-16 DIAGNOSIS — I10 Essential (primary) hypertension: Secondary | ICD-10-CM | POA: Diagnosis not present

## 2016-02-16 DIAGNOSIS — I480 Paroxysmal atrial fibrillation: Secondary | ICD-10-CM

## 2016-02-16 NOTE — Progress Notes (Signed)
Cardiology Office Note   Date:  02/16/2016   ID:  Corey, [name] 1934-09-09, MRN [id number]  PCP:  Lillia Mountain, MD  Cardiologist: Cassell Clement MD  CC: Doing well, no complaints   History of Present Illness: Corey [name] is a 81 y.o. male who presents for cardiology evaluation.  He is seen by me for the first time [name].  He is seen at the request of Dr. Kirby Funk.  The patient is being seen because of atrial fibrillation of unknown duration.  The patient was seen for a physical in November 2016 by Dr. Valentina Lucks.  No EKG was done at that time according to the patient, but no mention of atrial fibrillation or irregular pulse wasn't mentioned.  Subsequently had a nurse practitioner house call about a week ago who performed a physical examination on him at his home.  She noted an irregular pulse.  Corey [name]'s office was notified and he was seen in Dr. Jone Baseman office on 07/14/15 by Ida Rogue, NP, who obtained an electrocardiogram and confirmed the presence of atrial fibrillation. The patient himself is really not aware of his heart rate.  He does not know how long he may have been in the atrial fibrillation.  He began experiencing fatigue several years ago and because of that stop playing golf about 2 years ago.  He would give out after playing about 12 holes of golf.  He has not had any symptoms of congestive heart failure.  He sleeps on one pillow.  He does not have any difficulty climbing stairs.  He has had a sense of vague chest tightness at times.  There is no radiation to the arms.  The past 6 months the patient has also had occasional night sweats which are new. The patient has had some aching in the muscles of his legs.  He is on statin therapy. He has not had any TIA symptoms.  He has not had any dizziness or syncope.  Corey [name] is seen in the office [name] is a new patient to me however he was recently seen by Dr. Patty Sermons. Unfortunately Dr.  Patty Sermons just retired and as Corey [name] is my patient, he is kindly requested my services. This or [name] says over the past year he's had some worsening fatigue and decreased energy levels. In fact she's given up a lot of activities including golf which he enjoyed playing. He thought it was just related to being older. He also stopped doing a lot of lawn work and other activities. Recently had a home physical where he was found to be out of rhythm and then had an EKG which demonstrated atrial fibrillation. He was started on Eliquis and was seen by Dr. Patty Sermons. An echocardiogram and stress test were ordered. Both studies showed low normal LVEF of 50-55% however the nuclear stress test showed no ischemia. He had only mild left atrial enlargement on his echocardiogram. Apparently Dr. Patty Sermons had discussed the possibility of a cardioversion with him but had recommended at least 1 month of anticoagulation. [name] is his 28th day of Eliquis. He has not missed any doses. EKG shows persistent atrial fibrillation.  I saw Corey [name] in the office for follow-up. He underwent successful cardioversion to sinus rhythm and maintains that [name]. He notes that he's had some improvement in his energy and was able to mow his lawn without having to stop. He started walking again and is doing about a mile a day. Generally seems  to be thriving. Heart rate is noted to be low in the 50s but he noted that it was in the 50s for most of his life. He denies any chest pain, shortness of breath and as mentioned his energy level has improved. He seems to be tolerating Eliquis without any bleeding complications.  02/16/2016  Corey [name] is doing well without any complaints [name]. He is maintaining a sinus bradycardia with heart rate of 47 and is asymptomatic. He is not on any AV nodal blocking medications. He is on Eliquis 5 mg twice a day and has no complaints. He denies any bleeding. He is on  Lipitor for dyslipidemia but has not had a recent lipid profile. He denies chest pain or shortness of breath with exertion.  Past Medical History:  Diagnosis Date  . A-fib (HCC)   . Basal cell carcinoma of auricle of right ear   . BPH (benign prostatic hyperplasia)   . DJD (degenerative joint disease)    left  . ED (erectile dysfunction)   . Fatigue   . Hearing loss, sensorineural   . Hx of adenomatous colonic polyps   . Hx of major depression   . Hypertension   . Iron deficiency anemia   . Irregular heartbeat   . Nephrolithiasis   . Seasonal allergic rhinitis   . SOB (shortness of breath)     Past Surgical History:  Procedure Laterality Date  . CARDIOVERSION N/A 08/13/2015   Procedure: CARDIOVERSION;  Surgeon: Chrystie Nose, MD;  Location: [facility] ENDOSCOPY;  Service: Cardiovascular;  Laterality: N/A;  . CHOLECYSTECTOMY    . colonscopy    . herniorraphy Bilateral    had surgery x2 on the left  and right  . TONSILLECTOMY AND ADENOIDECTOMY       Current Outpatient Prescriptions  Medication Sig Dispense Refill  . amLODipine-benazepril (LOTREL) 5-20 MG capsule Take 1 capsule by mouth daily.    [name] apixaban (ELIQUIS) 5 MG TABS tablet Take 5 mg by mouth 2 (two) times daily.    [name] atorvastatin (LIPITOR) 20 MG tablet Take 20 mg by mouth daily.    . cholecalciferol (VITAMIN D) 1000 units tablet Take 1,000 Units by mouth daily.    . Multiple Vitamin (MULTIVITAMIN) tablet Take 1 tablet by mouth daily. Take once a day     No current facility-administered medications for this visit.     Allergies:   Review of patient's allergies indicates no known allergies.    Social History:  The patient  reports that he quit smoking about 55 years ago. His smoking use included Cigarettes. He does not have any smokeless tobacco history on file. He reports that he does not drink alcohol.   Family History:  The patient's family history includes Alcoholism in his daughter; Arrhythmia in his daughter;  CAD in his mother; Cancer - Prostate in his brother; Healthy in his daughter and son; Heart attack in his father; Heart disease in his mother. history father died of a heart attack at age 20.  Mother died of postoperative hemorrhage after heart surgery at age 74   ROS:  Please see the history of present illness.   Otherwise, review of systems are positive for none.   All other systems are reviewed and negative.    PHYSICAL EXAM: VS:  BP 136/76   Pulse (!) 47   Ht 6' (1.829 m)   Wt 210 lb 6.4 oz (95.4 kg)   BMI 28.54 kg/m  , BMI Body mass index is 28.54  kg/m. GEN: Well nourished, well developed, in no acute distress HEENT: normal Neck: no JVD, carotid bruits, or masses Cardiac: Regular bradycardia Respiratory:  clear to auscultation bilaterally, normal work of breathing GI: soft, nontender, nondistended, + BS MS: no deformity or atrophy Skin: warm and dry, no rash Neuro:  Strength and sensation are intact Psych: euthymic mood, full affect   EKG:  Sinus bradycardia at 47  Recent Labs: 08/11/2015: BUN 17; Creat 0.91; Hemoglobin 13.7; Platelets 317; Potassium 4.2; Sodium 139; TSH 1.92    Lipid Panel No results found for: CHOL, TRIG, HDL, CHOLHDL, VLDL, LDLCALC, LDLDIRECT    Wt Readings from Last 3 Encounters:  02/16/16 210 lb 6.4 oz (95.4 kg)  08/27/15 207 lb 8 oz (94.1 kg)  08/13/15 210 lb (95.3 kg)    ASSESSMENT AND PLAN:  1.  Atrial fibrillation - CHADSVASC score of 3 on Eliquis 2.  Essential hypertension 3.  History of hypercholesterolemia   Orders Placed This Encounter  Procedures  . Lipid panel  . Comprehensive Metabolic Panel (CMET)  . EKG 12-Lead   PLAN:    Corey [name] is doing well status post cardioversion. He is maintaining sinus bradycardia with improvement in energy. He has had no ill effects from his cardioversion. He is tolerating Eliquis without any bleeding complications. Given his CHADSVASC score of 3 we will continue this indefinitely. He is  due for a lipid profile and will also check a metabolic profile as well.  I'll plan to see Corey [name] back annually or sooner as necessary.  Chrystie Nose, MD, [facility] Attending Cardiologist [facility]  02/16/2016 10:41 AM

## 2016-02-16 NOTE — Patient Instructions (Signed)
Medication Instructions:  Your physician recommends that you continue on your current medications as directed. Please refer to the Current Medication list given to you today.  Labwork: Your physician recommends that you return for lab work in: Today Lipid and CMET. Labs need to be FASTING.   Testing/Procedures: None ordered  Follow-Up: Your physician wants you to follow-up in: 12 months wit Dr Debara Pickett. You will receive a reminder letter in the mail two months in advance. If you don't receive a letter, please call our office to schedule the follow-up appointment.  Any Other Special Instructions Will Be Listed Below (If Applicable).     If you need a refill on your cardiac medications before your next appointment, please call your pharmacy.

## 2016-02-17 LAB — COMPREHENSIVE METABOLIC PANEL
ALK PHOS: 72 U/L (ref 40–115)
ALT: 16 U/L (ref 9–46)
AST: 16 U/L (ref 10–35)
Albumin: 4.2 g/dL (ref 3.6–5.1)
BILIRUBIN TOTAL: 0.7 mg/dL (ref 0.2–1.2)
BUN: 12 mg/dL (ref 7–25)
CALCIUM: 9.2 mg/dL (ref 8.6–10.3)
CO2: 28 mmol/L (ref 20–31)
CREATININE: 0.8 mg/dL (ref 0.70–1.18)
Chloride: 105 mmol/L (ref 98–110)
GLUCOSE: 102 mg/dL — AB (ref 65–99)
Potassium: 5.3 mmol/L (ref 3.5–5.3)
SODIUM: 141 mmol/L (ref 135–146)
Total Protein: 6.8 g/dL (ref 6.1–8.1)

## 2016-02-17 LAB — LIPID PANEL
Cholesterol: 163 mg/dL (ref 125–200)
HDL: 77 mg/dL (ref >=40)
LDL Cholesterol: 69 mg/dL (ref <130)
Total CHOL/HDL Ratio: 2.1 Ratio (ref <=5.0)
Triglycerides: 86 mg/dL (ref <150)
VLDL: 17 mg/dL (ref <30)

## 2016-03-21 ENCOUNTER — Ambulatory Visit (INDEPENDENT_AMBULATORY_CARE_PROVIDER_SITE_OTHER): Payer: Medicare Other | Admitting: Cardiology

## 2016-03-21 ENCOUNTER — Encounter: Payer: Self-pay | Admitting: Cardiology

## 2016-03-21 ENCOUNTER — Telehealth: Payer: Self-pay | Admitting: Internal Medicine

## 2016-03-21 VITALS — BP 110/64 | HR 77 | Ht 72.0 in | Wt 211.0 lb

## 2016-03-21 DIAGNOSIS — I10 Essential (primary) hypertension: Secondary | ICD-10-CM

## 2016-03-21 DIAGNOSIS — I480 Paroxysmal atrial fibrillation: Secondary | ICD-10-CM

## 2016-03-21 DIAGNOSIS — Z7901 Long term (current) use of anticoagulants: Secondary | ICD-10-CM

## 2016-03-21 DIAGNOSIS — E785 Hyperlipidemia, unspecified: Secondary | ICD-10-CM

## 2016-03-21 DIAGNOSIS — R5383 Other fatigue: Secondary | ICD-10-CM | POA: Diagnosis not present

## 2016-03-21 LAB — CBC WITH DIFFERENTIAL/PLATELET
Basophils Absolute: 76 cells/uL (ref 0–200)
Basophils Relative: 1 %
Eosinophils Absolute: 228 cells/uL (ref 15–500)
Eosinophils Relative: 3 %
HCT: 40.2 % (ref 38.5–50.0)
Hemoglobin: 13.4 g/dL (ref 13.2–17.1)
Lymphocytes Relative: 24 %
Lymphs Abs: 1824 cells/uL (ref 850–3900)
MCH: 29.8 pg (ref 27.0–33.0)
MCHC: 33.3 g/dL (ref 32.0–36.0)
MCV: 89.3 fL (ref 80.0–100.0)
MPV: 8.9 fL (ref 7.5–12.5)
Monocytes Absolute: 760 cells/uL (ref 200–950)
Monocytes Relative: 10 %
Neutro Abs: 4712 cells/uL (ref 1500–7800)
Neutrophils Relative %: 62 %
Platelets: 335 10*3/uL (ref 140–400)
RBC: 4.5 MIL/uL (ref 4.20–5.80)
RDW: 13.6 % (ref 11.0–15.0)
WBC: 7.6 10*3/uL (ref 3.8–10.8)

## 2016-03-21 MED ORDER — FLECAINIDE ACETATE 50 MG PO TABS: 50.0000 mg | ORAL_TABLET | Freq: Two times a day (BID) | ORAL | 1 refills | Status: DC

## 2016-03-21 NOTE — Assessment & Plan Note (Addendum)
DCCV to NSR 08/13/15- back in AF today with symptoms of fatigue.

## 2016-03-21 NOTE — Assessment & Plan Note (Signed)
Controlled.  

## 2016-03-21 NOTE — Assessment & Plan Note (Signed)
On statin Rx 

## 2016-03-21 NOTE — Telephone Encounter (Signed)
New Message  Patient c/o Palpitations:  High priority if patient c/o lightheadedness and shortness of breath.  1. How long have you been having palpitations? Yesterday  2. Are you currently experiencing lightheadedness and shortness of breath? Yes  3. Have you checked your BP and heart rate? (document readings)  pulse went to 54 to 87 yesterday pules from today 78 other at 76 normal pulse 54-57  4. Are you experiencing any other symptoms? Stagger and can't keep his balance.

## 2016-03-21 NOTE — Telephone Encounter (Signed)
Returned call to patient.He stated he feels like his heart is out of rhythm.Stated this past Saturday 03/18/16 he noticed he was sob,no energy.Pulse normally 54 to 57.Pulse now 78 to 87.Stated before when he had AFib pulse elevated.Appointment scheduled with Kerin Ransom PA this afternoon at 3:30 pm.Advised to bring a list of all medications.

## 2016-03-21 NOTE — Progress Notes (Signed)
03/21/2016 Corey [name]   [birth year]  [id number]  Primary Physician Lillia Mountain, MD Primary Cardiologist: Dr Rennis Golden  HPI:  81 y/o male followed by Dr Rennis Golden with a history of PAF. He underwent DCCV in March 2017. When he presented he was not terribly symptomatic but he tells me now that he definitely felt better after the cardioversion. He has remained in NSR, sinus bradycardia, on no chronotropic agents. He just saw Dr Rennis Golden last month and was doing well. He is in the office today after he noted dyspnea while doing some yard work this past weekend. He took his pulse and noted it was in the 80's, high for him. Today in the office his EKG confirms he is in AF with VR 80.    Current Outpatient Prescriptions  Medication Sig Dispense Refill  . amLODipine-benazepril (LOTREL) 5-20 MG capsule Take 1 capsule by mouth daily.    Corey [name] apixaban (ELIQUIS) 5 MG TABS tablet Take 5 mg by mouth 2 (two) times daily.    Corey [name] atorvastatin (LIPITOR) 20 MG tablet Take 20 mg by mouth daily.    . cholecalciferol (VITAMIN D) 1000 units tablet Take 1,000 Units by mouth daily.    . Multiple Vitamin (MULTIVITAMIN) tablet Take 1 tablet by mouth daily. Take once a day    . flecainide (TAMBOCOR) 50 MG tablet Take 1 tablet (50 mg total) by mouth 2 (two) times daily. 180 tablet 1   No current facility-administered medications for this visit.     No Active Allergies  Social History   Social History  . Marital status: Married    Spouse name: N/A  . Number of children: N/A  . Years of education: N/A   Occupational History  . Not on file.   Social History Main Topics  . Smoking status: Former Smoker    Types: Cigarettes    Quit date: 07/15/1960  . Smokeless tobacco: Not on file  . Alcohol use No  . Drug use: Unknown  . Sexual activity: Yes   Other Topics Concern  . Not on file   Social History Narrative  . No narrative on file     Review of Systems: General: negative for chills, fever,  night sweats or weight changes.  Cardiovascular: negative for chest pain, dyspnea on exertion, edema, orthopnea, palpitations, paroxysmal nocturnal dyspnea or shortness of breath Dermatological: negative for rash Respiratory: negative for cough or wheezing Urologic: negative for hematuria Abdominal: negative for nausea, vomiting, diarrhea, bright red blood per rectum, melena, or hematemesis Neurologic: negative for visual changes, syncope, or dizziness All other systems reviewed and are otherwise negative except as noted above.    Blood pressure 110/64, pulse 77, height 6' (1.829 m), weight 211 lb (95.7 kg).  General appearance: alert, cooperative and no distress Neck: no carotid bruit and no JVD Lungs: clear to auscultation bilaterally Heart: irregularly irregular rhythm Extremities: extremities normal, atraumatic, no cyanosis or edema Skin: Skin color, texture, turgor normal. No rashes or lesions Neurologic: Grossly normal  EKG AF with VR 80  ASSESSMENT AND PLAN:   Paroxysmal atrial fibrillation (HCC) DCCV to NSR 08/13/15- back in AF today with symptoms of fatigue.   Essential hypertension Controlled  Long term current use of anticoagulant therapy CHADs VASc=3 He has not missed any Eliquis doses  Dyslipidemia On statin Rx   PLAN  Discussed with Dr Rennis Golden- add Flecainide 50 mg BID, check EKG in 3 days, OP DCCV 2 weeks. His Myoview in March 2017 was low risk  and echo was normal. He knows not to miss any Eliquis doses. Labs will be obtained today.   Corine Shelter PA-C 03/21/2016 4:12 PM

## 2016-03-21 NOTE — Assessment & Plan Note (Signed)
CHADs VASc=3 He has not missed any Eliquis doses

## 2016-03-21 NOTE — Patient Instructions (Addendum)
Medication Instructions:  Your physician has recommended you make the following change in your medication:  1.  START Flecainide 50 mg taking 1 tablet twice ad ay (every 12 hours) DO NOT MISS A DOSE OF YOUR ELIQUIS  Labwork: TODAY:  CBC W/DIFF, PT/INR, & BMET  Testing/Procedures: Your physician has recommended that you have a Cardioversion (DCCV) IN 2 WEEKS FROM TODAY, WITH DR. HILTY IF POSSIBLE.  Electrical Cardioversion uses a jolt of electricity to your heart either through paddles or wired patches attached to your chest. This is a controlled, usually prescheduled, procedure. Defibrillation is done under light anesthesia in the hospital, and you usually go home the day of the procedure. This is done to get your heart back into a normal rhythm. You are not awake for the procedure. Please see the instruction sheet given to you today.   Follow-Up: Your physician recommends that you schedule a follow-up appointment in:  03/24/16 WITH THE PHARM D OR A NURSE VISIT TO REPEAT EKG   Any Other Special Instructions Will Be Listed Below (If Applicable).   Electrical Cardioversion Electrical cardioversion is the delivery of a jolt of electricity to change the rhythm of the heart. Sticky patches or metal paddles are placed on the chest to deliver the electricity from a device. This is done to restore a normal rhythm. A rhythm that is too fast or not regular keeps the heart from pumping well. Electrical cardioversion is done in an emergency if:   There is low or no blood pressure as a result of the heart rhythm.   Normal rhythm must be restored as fast as possible to protect the brain and heart from further damage.   It may save a life. Cardioversion may be done for heart rhythms that are not immediately life threatening, such as atrial fibrillation or flutter, in which:   The heart is beating too fast or is not regular.   Medicine to change the rhythm has not worked.   It is safe to wait  in order to allow time for preparation.  Symptoms of the abnormal rhythm are bothersome.  The risk of stroke and other serious problems can be reduced. LET [facility] CARE PROVIDER KNOW ABOUT:   Any allergies you have.  All medicines you are taking, including vitamins, herbs, eye drops, creams, and over-the-counter medicines.  Previous problems you or members of your family have had with the use of anesthetics.   Any blood disorders you have.   Previous surgeries you have had.   Medical conditions you have. RISKS AND COMPLICATIONS  Generally, this is a safe procedure. However, problems can occur and include:   Breathing problems related to the anesthetic used.  A blood clot that breaks free and travels to other parts of your body. This could cause a stroke or other problems. The risk of this is lowered by use of blood-thinning medicine (anticoagulant) prior to the procedure.  Cardiac arrest (rare). BEFORE THE PROCEDURE   You may have tests to detect blood clots in your heart and to evaluate heart function.  You may start taking anticoagulants so your blood does not clot as easily.   Medicines may be given to help stabilize your heart rate and rhythm. PROCEDURE  You will be given medicine through an IV tube to reduce discomfort and make you sleepy (sedative).   An electrical shock will be delivered. AFTER THE PROCEDURE Your heart rhythm will be watched to make sure it does not change.  This information is not intended to replace advice given to you by your health care provider. Make sure you discuss any questions you have with your health care provider.   Document Released: 05/05/2002 Document Revised: 06/05/2014 Document Reviewed: 11/27/2012 Elsevier Interactive Patient Education [employer].

## 2016-03-22 LAB — BASIC METABOLIC PANEL
BUN: 17 mg/dL (ref 7–25)
CO2: 27 mmol/L (ref 20–31)
Calcium: 9.4 mg/dL (ref 8.6–10.3)
Chloride: 103 mmol/L (ref 98–110)
Creat: 0.99 mg/dL (ref 0.70–1.18)
Glucose, Bld: 98 mg/dL (ref 65–99)
Potassium: 5 mmol/L (ref 3.5–5.3)
Sodium: 139 mmol/L (ref 135–146)

## 2016-03-22 LAB — PROTIME-INR
INR: 1.1
Prothrombin Time: 11.2 s (ref 9.0–11.5)

## 2016-04-03 NOTE — Addendum Note (Signed)
Addended by: Erlene Quan on: 04/03/2016 03:40 PM   Modules accepted: Orders, SmartSet

## 2016-04-04 ENCOUNTER — Ambulatory Visit (HOSPITAL_COMMUNITY): Payer: Medicare Other | Admitting: Certified Registered Nurse Anesthetist

## 2016-04-04 ENCOUNTER — Telehealth (HOSPITAL_COMMUNITY): Payer: Self-pay | Admitting: *Deleted

## 2016-04-04 ENCOUNTER — Encounter (HOSPITAL_COMMUNITY): Payer: Self-pay

## 2016-04-04 ENCOUNTER — Ambulatory Visit (HOSPITAL_COMMUNITY)
Admission: RE | Admit: 2016-04-04 | Discharge: 2016-04-04 | Disposition: A | Discharge status: Home / Self Care | Payer: Medicare Other | Source: Ambulatory Visit | Attending: Cardiology | Admitting: Cardiology

## 2016-04-04 ENCOUNTER — Encounter (HOSPITAL_COMMUNITY)
Admission: RE | Disposition: A | Discharge status: Home / Self Care | Payer: Self-pay | Source: Ambulatory Visit | Attending: Cardiology

## 2016-04-04 ENCOUNTER — Telehealth: Payer: Self-pay | Admitting: Cardiology

## 2016-04-04 ENCOUNTER — Telehealth: Payer: Self-pay | Admitting: Internal Medicine

## 2016-04-04 DIAGNOSIS — I4891 Unspecified atrial fibrillation: Secondary | ICD-10-CM | POA: Diagnosis present

## 2016-04-04 DIAGNOSIS — I10 Essential (primary) hypertension: Secondary | ICD-10-CM | POA: Diagnosis not present

## 2016-04-04 DIAGNOSIS — Z7901 Long term (current) use of anticoagulants: Secondary | ICD-10-CM | POA: Insufficient documentation

## 2016-04-04 DIAGNOSIS — E785 Hyperlipidemia, unspecified: Secondary | ICD-10-CM | POA: Diagnosis not present

## 2016-04-04 DIAGNOSIS — Z79899 Other long term (current) drug therapy: Secondary | ICD-10-CM | POA: Insufficient documentation

## 2016-04-04 DIAGNOSIS — I480 Paroxysmal atrial fibrillation: Secondary | ICD-10-CM | POA: Insufficient documentation

## 2016-04-04 DIAGNOSIS — Z87891 Personal history of nicotine dependence: Secondary | ICD-10-CM | POA: Insufficient documentation

## 2016-04-04 HISTORY — PX: CARDIOVERSION: SHX1299

## 2016-04-04 SURGERY — CARDIOVERSION
Anesthesia: Monitor Anesthesia Care

## 2016-04-04 MED ORDER — SODIUM CHLORIDE 0.9% FLUSH: 3.0000 mL | Freq: Two times a day (BID) | INTRAVENOUS | Status: DC

## 2016-04-04 MED ORDER — PROPOFOL 10 MG/ML IV BOLUS
INTRAVENOUS | Status: DC | PRN
  Administered 2016-04-04: 100 mg via INTRAVENOUS

## 2016-04-04 MED ORDER — SODIUM CHLORIDE 0.9 % IV SOLN
INTRAVENOUS | Status: DC
  Administered 2016-04-04: 500 mL via INTRAVENOUS
  Administered 2016-04-04: 12:00:00 via INTRAVENOUS

## 2016-04-04 MED ORDER — SODIUM CHLORIDE 0.9% FLUSH: 3.0000 mL | INTRAVENOUS | Status: DC | PRN

## 2016-04-04 MED ORDER — LIDOCAINE HCL (CARDIAC) 20 MG/ML IV SOLN
INTRAVENOUS | Status: DC | PRN
  Administered 2016-04-04: 100 mg via INTRAVENOUS

## 2016-04-04 MED ORDER — SODIUM CHLORIDE 0.9 % IV SOLN: 250.0000 mL | INTRAVENOUS | Status: DC

## 2016-04-04 NOTE — Discharge Instructions (Signed)
Electrical Cardioversion, Care After °Refer to this sheet in the next few weeks. These instructions provide you with information on caring for yourself after your procedure. Your health care provider may also give you more specific instructions. Your treatment has been planned according to current medical practices, but problems sometimes occur. Call your health care provider if you have any problems or questions after your procedure. °WHAT TO EXPECT AFTER THE PROCEDURE °After your procedure, it is typical to have the following sensations: °· Some redness on the skin where the shocks were delivered. If this is tender, a sunburn lotion or hydrocortisone cream may help. °· Possible return of an abnormal heart rhythm within hours or days after the procedure. °HOME CARE INSTRUCTIONS °· Take medicines only as directed by your health care provider. Be sure you understand how and when to take your medicine. °· Learn how to feel your pulse and check it often. °· Limit your activity for 48 hours after the procedure or as directed by your health care provider. °· Avoid or minimize caffeine and other stimulants as directed by your health care provider. °SEEK MEDICAL CARE IF: °· You feel like your heart is beating too fast or your pulse is not regular. °· You have any questions about your medicines. °· You have bleeding that will not stop. °SEEK IMMEDIATE MEDICAL CARE IF: °· You are dizzy or feel faint. °· It is hard to breathe or you feel short of breath. °· There is a change in discomfort in your chest. °· Your speech is slurred or you have trouble moving an arm or leg on one side of your body. °· You get a serious muscle cramp that does not go away. °· Your fingers or toes turn cold or blue. °  °This information is not intended to replace advice given to you by your health care provider. Make sure you discuss any questions you have with your health care provider. °  °Document Released: 03/05/2013 Document Revised: 06/05/2014  Document Reviewed: 03/05/2013 °Elsevier Interactive Patient Education ©2016 Elsevier Inc. ° °

## 2016-04-04 NOTE — Anesthesia Preprocedure Evaluation (Addendum)
Anesthesia Evaluation  Patient identified by MRN, date of birth, ID band Patient awake    Reviewed: Allergy & Precautions, NPO status , Patient's Chart, lab work & pertinent test results  Airway Mallampati: I  TM Distance: >3 FB Neck ROM: Full    Dental   Pulmonary former smoker,    Pulmonary exam normal        Cardiovascular hypertension, Pt. on medications Normal cardiovascular exam     Neuro/Psych    GI/Hepatic   Endo/Other    Renal/GU      Musculoskeletal   Abdominal   Peds  Hematology   Anesthesia Other Findings   Reproductive/Obstetrics                             Anesthesia Physical Anesthesia Plan  ASA: III  Anesthesia Plan: General   Post-op Pain Management:    Induction: Intravenous  Airway Management Planned: Mask  Additional Equipment:   Intra-op Plan:   Post-operative Plan:   Informed Consent:   Plan Discussed with: CRNA and Surgeon  Anesthesia Plan Comments:         Anesthesia Quick Evaluation

## 2016-04-04 NOTE — Anesthesia Postprocedure Evaluation (Signed)
Anesthesia Post Note  Patient: Corey [name]  Procedure(s) Performed: Procedure(s) (LRB): CARDIOVERSION (N/A)  Patient location during evaluation: PACU Anesthesia Type: General Level of consciousness: awake and alert Pain management: pain level controlled Vital Signs Assessment: post-procedure vital signs reviewed and stable Respiratory status: spontaneous breathing, nonlabored ventilation, respiratory function stable and patient connected to nasal cannula oxygen Cardiovascular status: blood pressure returned to baseline and stable Postop Assessment: no signs of nausea or vomiting Anesthetic complications: no    Last Vitals:  Vitals:   04/04/16 1210 04/04/16 1220  BP: 110/63 112/71  Pulse: 62   Resp: (!) 0   Temp:      Last Pain:  Vitals:   04/04/16 1207  TempSrc: Oral                 Duward Allbritton DAVID

## 2016-04-04 NOTE — CV Procedure (Signed)
Electrical Cardioversion Procedure Note Corey [name] [id number] October 29, 1934  Procedure: Electrical Cardioversion Indications:  Atrial Fibrillation  Time Out: Verified patient identification, verified procedure,medications/allergies/relevent history reviewed, required imaging and test results available.  Performed  Procedure Details  The patient was NPO after midnight. Anesthesia was administered at the beside  by [facility] with 100mg  of propofol and 100mg  Lidocaine.  Cardioversion was done with synchronized biphasic defibrillation with AP pads with 150watts.  The patient converted to normal sinus rhythm. The patient tolerated the procedure well   IMPRESSION:  Successful cardioversion of atrial fibrillation    Corey [name] 04/04/2016, 11:50 AM

## 2016-04-04 NOTE — Interval H&P Note (Signed)
History and Physical Interval Note:  04/04/2016 11:50 AM  Corey [name]  has presented today for surgery, with the diagnosis of afib  The various methods of treatment have been discussed with the patient and family. After consideration of risks, benefits and other options for treatment, the patient has consented to  Procedure(s): CARDIOVERSION (N/A) as a surgical intervention .  The patient's history has been reviewed, patient examined, no change in status, stable for surgery.  I have reviewed the patient's chart and labs.  Questions were answered to the patient's satisfaction.     Fransico Him

## 2016-04-04 NOTE — Telephone Encounter (Signed)
New message      Pt had a cardioversion today.  Pt want to know if he should continue taking flecainide?  Please call

## 2016-04-04 NOTE — Telephone Encounter (Signed)
Returned call to patient he stated he had a successful cardioversion this morning.Stated he wanted to know if he needs to continue taking flecainide. Charlotte Sanes RN  spoke to [place] at [facility] she will check with Roderic Palau NP, she return call to patient.

## 2016-04-04 NOTE — Telephone Encounter (Signed)
Corey [name] from [facility] cld to notify pt needs 1 week f/u with Roderic Palau, NP post DCCV.  Also question as to whether the pt should continue flecainide. Per note, pt converted, so he would continue flecainide.  I cld pt and scheduled for 1 week f/u. Pt was given directions and code for garage

## 2016-04-04 NOTE — Telephone Encounter (Signed)
Spoke to patient who acknowledged call from [facility] and understanding of recommendations per Roderic Palau NP to continue Flecainide. He is aware to call if he has further inquiries and voice thanks for everyone's assistance.

## 2016-04-04 NOTE — Transfer of Care (Signed)
Immediate Anesthesia Transfer of Care Note  Patient: Corey [name]  Procedure(s) Performed: Procedure(s): CARDIOVERSION (N/A)  Patient Location: Endoscopy Unit  Anesthesia Type:General  Level of Consciousness: awake and alert   Airway & Oxygen Therapy: Patient Spontanous Breathing and Patient connected to nasal cannula oxygen  Post-op Assessment: Report given to RN and Post -op Vital signs reviewed and stable  Post vital signs: Reviewed and stable  Last Vitals:  Vitals:   04/04/16 1017  BP: 139/65  Pulse: 91  Resp: 14  Temp: 36.8 C    Last Pain:  Vitals:   04/04/16 1017  TempSrc: Oral         Complications: No apparent anesthesia complications

## 2016-04-04 NOTE — Telephone Encounter (Signed)
Patient needs 1 week followup with Roderic Palau, NP post cardioversion

## 2016-04-04 NOTE — H&P (View-Only) (Signed)
03/21/2016 [name]   [birth year]  [id number]  Primary Physician Lillia Mountain, MD Primary Cardiologist: Dr Rennis Golden  HPI:  81 y/o male followed by Dr Rennis Golden with a history of PAF. He underwent DCCV in March 2017. When he presented he was not terribly symptomatic but he tells me now that he definitely felt better after the cardioversion. He has remained in NSR, sinus bradycardia, on no chronotropic agents. He just saw Dr Rennis Golden last month and was doing well. He is in the office today after he noted dyspnea while doing some yard work this past weekend. He took his pulse and noted it was in the 80's, high for him. Today in the office his EKG confirms he is in AF with VR 80.    Current Outpatient Prescriptions  Medication Sig Dispense Refill  . amLODipine-benazepril (LOTREL) 5-20 MG capsule Take 1 capsule by mouth daily.    [name] apixaban (ELIQUIS) 5 MG TABS tablet Take 5 mg by mouth 2 (two) times daily.    [name] atorvastatin (LIPITOR) 20 MG tablet Take 20 mg by mouth daily.    . cholecalciferol (VITAMIN D) 1000 units tablet Take 1,000 Units by mouth daily.    . Multiple Vitamin (MULTIVITAMIN) tablet Take 1 tablet by mouth daily. Take once a day    . flecainide (TAMBOCOR) 50 MG tablet Take 1 tablet (50 mg total) by mouth 2 (two) times daily. 180 tablet 1   No current facility-administered medications for this visit.     No Active Allergies  Social History   Social History  . Marital status: Married    Spouse name: N/A  . Number of children: N/A  . Years of education: N/A   Occupational History  . Not on file.   Social History Main Topics  . Smoking status: Former Smoker    Types: Cigarettes    Quit date: 07/15/1960  . Smokeless tobacco: Not on file  . Alcohol use No  . Drug use: Unknown  . Sexual activity: Yes   Other Topics Concern  . Not on file   Social History Narrative  . No narrative on file     Review of Systems: General: negative for chills, fever,  night sweats or weight changes.  Cardiovascular: negative for chest pain, dyspnea on exertion, edema, orthopnea, palpitations, paroxysmal nocturnal dyspnea or shortness of breath Dermatological: negative for rash Respiratory: negative for cough or wheezing Urologic: negative for hematuria Abdominal: negative for nausea, vomiting, diarrhea, bright red blood per rectum, melena, or hematemesis Neurologic: negative for visual changes, syncope, or dizziness All other systems reviewed and are otherwise negative except as noted above.    Blood pressure 110/64, pulse 77, height 6' (1.829 m), weight 211 lb (95.7 kg).  General appearance: alert, cooperative and no distress Neck: no carotid bruit and no JVD Lungs: clear to auscultation bilaterally Heart: irregularly irregular rhythm Extremities: extremities normal, atraumatic, no cyanosis or edema Skin: Skin color, texture, turgor normal. No rashes or lesions Neurologic: Grossly normal  EKG AF with VR 80  ASSESSMENT AND PLAN:   Paroxysmal atrial fibrillation (HCC) DCCV to NSR 08/13/15- back in AF today with symptoms of fatigue.   Essential hypertension Controlled  Long term current use of anticoagulant therapy CHADs VASc=3 He has not missed any Eliquis doses  Dyslipidemia On statin Rx   PLAN  Discussed with Dr Rennis Golden- add Flecainide 50 mg BID, check EKG in 3 days, OP DCCV 2 weeks. His Myoview in March 2017 was low risk  and echo was normal. He knows not to miss any Eliquis doses. Labs will be obtained today.   Corine Shelter PA-C 03/21/2016 4:12 PM

## 2016-04-06 ENCOUNTER — Encounter (HOSPITAL_COMMUNITY): Payer: Self-pay | Admitting: Cardiology

## 2016-04-11 ENCOUNTER — Encounter (HOSPITAL_COMMUNITY): Payer: Self-pay | Admitting: Nurse Practitioner

## 2016-04-11 ENCOUNTER — Ambulatory Visit (HOSPITAL_COMMUNITY)
Admission: RE | Admit: 2016-04-11 | Discharge: 2016-04-11 | Disposition: A | Discharge status: Home / Self Care | Payer: Medicare Other | Source: Ambulatory Visit | Attending: Nurse Practitioner | Admitting: Nurse Practitioner

## 2016-04-11 VITALS — BP 138/62 | HR 47 | Ht 72.0 in | Wt 213.0 lb

## 2016-04-11 DIAGNOSIS — J302 Other seasonal allergic rhinitis: Secondary | ICD-10-CM | POA: Insufficient documentation

## 2016-04-11 DIAGNOSIS — Z87891 Personal history of nicotine dependence: Secondary | ICD-10-CM | POA: Diagnosis not present

## 2016-04-11 DIAGNOSIS — Z8249 Family history of ischemic heart disease and other diseases of the circulatory system: Secondary | ICD-10-CM | POA: Insufficient documentation

## 2016-04-11 DIAGNOSIS — H905 Unspecified sensorineural hearing loss: Secondary | ICD-10-CM | POA: Diagnosis not present

## 2016-04-11 DIAGNOSIS — I480 Paroxysmal atrial fibrillation: Secondary | ICD-10-CM | POA: Diagnosis not present

## 2016-04-11 DIAGNOSIS — N529 Male erectile dysfunction, unspecified: Secondary | ICD-10-CM | POA: Diagnosis not present

## 2016-04-11 DIAGNOSIS — N400 Enlarged prostate without lower urinary tract symptoms: Secondary | ICD-10-CM | POA: Diagnosis not present

## 2016-04-11 DIAGNOSIS — D509 Iron deficiency anemia, unspecified: Secondary | ICD-10-CM | POA: Diagnosis not present

## 2016-04-11 DIAGNOSIS — Z7901 Long term (current) use of anticoagulants: Secondary | ICD-10-CM | POA: Insufficient documentation

## 2016-04-11 DIAGNOSIS — F329 Major depressive disorder, single episode, unspecified: Secondary | ICD-10-CM | POA: Insufficient documentation

## 2016-04-11 DIAGNOSIS — R001 Bradycardia, unspecified: Secondary | ICD-10-CM | POA: Diagnosis not present

## 2016-04-11 DIAGNOSIS — Z85828 Personal history of other malignant neoplasm of skin: Secondary | ICD-10-CM | POA: Insufficient documentation

## 2016-04-11 DIAGNOSIS — I10 Essential (primary) hypertension: Secondary | ICD-10-CM | POA: Diagnosis not present

## 2016-04-11 DIAGNOSIS — M1990 Unspecified osteoarthritis, unspecified site: Secondary | ICD-10-CM | POA: Diagnosis not present

## 2016-04-11 NOTE — Progress Notes (Signed)
Primary Care Physician: Irven Shelling, MD Referring Physician: DCCV f/u Cardiologist: Dr. Genella Rife Corey [name] is a 81 y.o. male with a h/o of paroxysmal afib that underwent DCCV in March 2017. When he presented he was not terribly symptomatic but he tells me now that he definitely felt better after the cardioversion. He has remained in NSR, sinus bradycardia, on no chronotropic agents. He just saw Dr Debara Pickett last month and was doing well. He  was seen by Kerin Ransom, PA, 10/24 and noted dyspnea while doing some yard work. He took his pulse and noted it was in the 80's, high for him. His EKG confirmed he was in AF with VR 80. He was started on flecainide 50 mg bid and was set up for cardioversion, which was successful. He is slow at baseline in the upper 40's today and in the low 50's at home. He is not symptomatic with this. Continues on eliquis 5 mg bid for at least three.  Today, he denies symptoms of palpitations, chest pain, shortness of breath, orthopnea, PND, lower extremity edema, dizziness, presyncope, syncope, or neurologic sequela. The patient is tolerating medications without difficulties and is otherwise without complaint today.   Past Medical History:  Diagnosis Date  . A-fib ([place])   . Basal cell carcinoma of auricle of right ear   . BPH (benign prostatic hyperplasia)   . DJD (degenerative joint disease)    left  . ED (erectile dysfunction)   . Fatigue   . Hearing loss, sensorineural   . Hx of adenomatous colonic polyps   . Hx of major depression   . Hypertension   . Iron deficiency anemia   . Irregular heartbeat   . Nephrolithiasis   . Seasonal allergic rhinitis   . SOB (shortness of breath)    Past Surgical History:  Procedure Laterality Date  . CARDIOVERSION N/A 08/13/2015   Procedure: CARDIOVERSION;  Surgeon: Pixie Casino, MD;  Location: [facility] ENDOSCOPY;  Service: Cardiovascular;  Laterality: N/A;  . CARDIOVERSION N/A 04/04/2016   Procedure:  CARDIOVERSION;  Surgeon: Sueanne Margarita, MD;  Location: [facility] ENDOSCOPY;  Service: Cardiovascular;  Laterality: N/A;  . CHOLECYSTECTOMY    . colonscopy    . herniorraphy Bilateral    had surgery x2 on the left  and right  . TONSILLECTOMY AND ADENOIDECTOMY      Current Outpatient Prescriptions  Medication Sig Dispense Refill  . amLODipine-benazepril (LOTREL) 5-20 MG capsule Take 1 capsule by mouth daily.    [name] apixaban (ELIQUIS) 5 MG TABS tablet Take 5 mg by mouth 2 (two) times daily.    [name] atorvastatin (LIPITOR) 20 MG tablet Take 20 mg by mouth daily.    . cholecalciferol (VITAMIN D) 1000 units tablet Take 1,000 Units by mouth daily.    . flecainide (TAMBOCOR) 50 MG tablet Take 1 tablet (50 mg total) by mouth 2 (two) times daily. 180 tablet 1  . Multiple Vitamin (MULTIVITAMIN) tablet Take 1 tablet by mouth daily. Take once a day     No current facility-administered medications for this encounter.     No Known Allergies  Social History   Social History  . Marital status: Married    Spouse name: N/A  . Number of children: N/A  . Years of education: N/A   Occupational History  . Not on file.   Social History Main Topics  . Smoking status: Former Smoker    Types: Cigarettes    Quit date: 07/15/1960  .  Smokeless tobacco: Never Used  . Alcohol use No  . Drug use: Unknown  . Sexual activity: Yes   Other Topics Concern  . Not on file   Social History Narrative  . No narrative on file    Family History  Problem Relation Age of Onset  . CAD Mother   . Heart disease Mother   . Heart attack Father   . Cancer - Prostate Brother   . Alcoholism Daughter   . Arrhythmia Daughter   . Healthy Son   . Healthy Daughter     ROS- All systems are reviewed and negative except as per the HPI above  Physical Exam: Vitals:   04/11/16 1032  BP: 138/62  Pulse: (!) 47  Weight: 213 lb (96.6 kg)  Height: 6' (1.829 m)    GEN- The patient is well appearing, alert and oriented x 3  today.   Head- normocephalic, atraumatic Eyes-  Sclera clear, conjunctiva pink Ears- hearing intact Oropharynx- clear Neck- supple, no JVP Lymph- no cervical lymphadenopathy Lungs- Clear to ausculation bilaterally, normal work of breathing Heart- Regular rate and rhythm, no murmurs, rubs or gallops, PMI not laterally displaced GI- soft, NT, ND, + BS Extremities- no clubbing, cyanosis, or edema MS- no significant deformity or atrophy Skin- no rash or lesion Psych- euthymic mood, full affect Neuro- strength and sensation are intact  EKG- Sinus brady at 47 bpm, IRBBB Epic records reviewed Echo-- Left ventricle: The cavity size was normal. There was mild   concentric hypertrophy. Systolic function was normal. The   estimated ejection fraction was in the range of 50% to 55%. Wall   motion was normal; there were no regional wall motion   abnormalities. The study was not technically sufficient to allow   evaluation of LV diastolic dysfunction due to atrial   fibrillation. - Aortic valve: Trileaflet; moderately thickened, moderately   calcified leaflets. There was no regurgitation. - Aortic root: The aortic root was normal in size. - Mitral valve: Structurally normal valve. There was mild   regurgitation. - Left atrium: The atrium was mildly dilated. - Right ventricle: Systolic function was normal. - Right atrium: The atrium was mildly dilated. - Tricuspid valve: There was mild regurgitation. - Pulmonary arteries: Systolic pressure was within the normal   range. - Inferior vena cava: The vessel was normal in size. The   respirophasic diameter changes were in the normal range (= 50%),   consistent with normal central venous pressure. - Pericardium, extracardiac: There was no pericardial effusion.  Assessment and Plan: 1. Paroxysmal atrial fibrillation  Successful cardioversion and maintaining SR on flecainide 50 mg bid Continue xarelto  2. Bradycardia Asymptomatic ETT to look  for chronotropic competence and to assess for  Flecainide exercise induced changes If HR becomes lower and symptomatic, he may need to be considered for pacemaker  F/u Dr. Debara Pickett in 3 months

## 2016-04-13 ENCOUNTER — Ambulatory Visit (HOSPITAL_COMMUNITY)
Admission: RE | Admit: 2016-04-13 | Discharge: 2016-04-13 | Disposition: A | Discharge status: Home / Self Care | Payer: Medicare Other | Source: Ambulatory Visit | Attending: Nurse Practitioner | Admitting: Nurse Practitioner

## 2016-04-13 DIAGNOSIS — I480 Paroxysmal atrial fibrillation: Secondary | ICD-10-CM | POA: Diagnosis present

## 2016-05-03 ENCOUNTER — Telehealth: Payer: Self-pay | Admitting: Internal Medicine

## 2016-05-03 NOTE — Telephone Encounter (Signed)
New message  Pt's pulse been in the  80's  Please call pt and advise as to whether he needs to be seen prior to [facility] recall appt  Call at [phone] if not at home

## 2016-05-03 NOTE — Telephone Encounter (Signed)
Tried to call pt phone just keeps ringing. 

## 2016-05-04 NOTE — Telephone Encounter (Signed)
Called [facility] scheduled appt for 05-05-16 9am  Pt notified of Dr Debara Pickett suggestion to go to the Afib clnic and of appt made he states that he will go to appt @90  05-05-16

## 2016-05-04 NOTE — Telephone Encounter (Signed)
If HR is higher than normal, he may be back in a-fib. If he wants to come by the office to check an EKG or arrange for him to go to the a-fib clinic to check to see if he is in a-fib, that would be reasonable. He was recently cardioverted and was in sinus on flecainide. It sounds like he is asymptomatic, though.  Dr. Lemmie Evens

## 2016-05-04 NOTE — Telephone Encounter (Signed)
Spoke with pt states that his heart rate usually runs 54-57 an he states that the last few days upon waking in the am it has been running 67-69, with activity it runs in the 70's states that BP runs about 120's/80's  He states that he has in the last few days HR is runnine at rest in the 80's then goes down. Pt denied any other symptoms and stated that sometimes he may get lightheaded upon standing but that is all. He states that he doesn't think that he needs an appointment, he feels fine now and walks a mile a day. But, if MD thinks he needs to be seen he will. Please advise

## 2016-05-05 ENCOUNTER — Ambulatory Visit (HOSPITAL_COMMUNITY)
Admission: RE | Admit: 2016-05-05 | Discharge: 2016-05-05 | Disposition: A | Discharge status: Home / Self Care | Payer: Medicare Other | Source: Ambulatory Visit | Attending: Nurse Practitioner | Admitting: Nurse Practitioner

## 2016-05-05 ENCOUNTER — Encounter (HOSPITAL_COMMUNITY): Payer: Self-pay | Admitting: Nurse Practitioner

## 2016-05-05 VITALS — BP 130/72 | HR 68 | Ht 72.0 in | Wt 213.2 lb

## 2016-05-05 DIAGNOSIS — H905 Unspecified sensorineural hearing loss: Secondary | ICD-10-CM | POA: Insufficient documentation

## 2016-05-05 DIAGNOSIS — Z9049 Acquired absence of other specified parts of digestive tract: Secondary | ICD-10-CM | POA: Diagnosis not present

## 2016-05-05 DIAGNOSIS — Z87442 Personal history of urinary calculi: Secondary | ICD-10-CM | POA: Insufficient documentation

## 2016-05-05 DIAGNOSIS — Z85828 Personal history of other malignant neoplasm of skin: Secondary | ICD-10-CM | POA: Diagnosis not present

## 2016-05-05 DIAGNOSIS — N400 Enlarged prostate without lower urinary tract symptoms: Secondary | ICD-10-CM | POA: Insufficient documentation

## 2016-05-05 DIAGNOSIS — F329 Major depressive disorder, single episode, unspecified: Secondary | ICD-10-CM | POA: Insufficient documentation

## 2016-05-05 DIAGNOSIS — I10 Essential (primary) hypertension: Secondary | ICD-10-CM | POA: Diagnosis not present

## 2016-05-05 DIAGNOSIS — R001 Bradycardia, unspecified: Secondary | ICD-10-CM | POA: Insufficient documentation

## 2016-05-05 DIAGNOSIS — D509 Iron deficiency anemia, unspecified: Secondary | ICD-10-CM | POA: Insufficient documentation

## 2016-05-05 DIAGNOSIS — Z86010 Personal history of colonic polyps: Secondary | ICD-10-CM | POA: Insufficient documentation

## 2016-05-05 DIAGNOSIS — I480 Paroxysmal atrial fibrillation: Secondary | ICD-10-CM | POA: Diagnosis not present

## 2016-05-05 DIAGNOSIS — Z87891 Personal history of nicotine dependence: Secondary | ICD-10-CM | POA: Diagnosis not present

## 2016-05-05 DIAGNOSIS — Z7901 Long term (current) use of anticoagulants: Secondary | ICD-10-CM | POA: Insufficient documentation

## 2016-05-05 DIAGNOSIS — Z8249 Family history of ischemic heart disease and other diseases of the circulatory system: Secondary | ICD-10-CM | POA: Diagnosis not present

## 2016-05-05 DIAGNOSIS — M1990 Unspecified osteoarthritis, unspecified site: Secondary | ICD-10-CM | POA: Insufficient documentation

## 2016-05-05 DIAGNOSIS — N529 Male erectile dysfunction, unspecified: Secondary | ICD-10-CM | POA: Insufficient documentation

## 2016-05-05 MED ORDER — FLECAINIDE ACETATE 50 MG PO TABS: 75.0000 mg | ORAL_TABLET | Freq: Two times a day (BID) | ORAL | 1 refills | Status: DC

## 2016-05-05 NOTE — Patient Instructions (Signed)
Take Flecainide 1.5 tablets twice daily

## 2016-05-05 NOTE — Progress Notes (Signed)
Primary Care Physician: Irven Shelling, MD Referring Physician: DCCV f/u Cardiologist: Dr. Genella Rife Corey [name] is a 81 y.o. male with a h/o of paroxysmal afib that underwent DCCV in March 2017. When he presented he was not terribly symptomatic but he tells me now that he definitely felt better after the cardioversion. He has remained in NSR, sinus bradycardia, on no chronotropic agents. He just saw Dr Debara Pickett last month and was doing well. He  was seen by Kerin Ransom, PA, 10/24 and noted dyspnea while doing some yard work. He took his pulse and noted it was in the 80's, high for him. His EKG confirmed he was in AF with VR 80. He was started on flecainide 50 mg bid and was set up for cardioversion, which was successful. He is slow at baseline in the upper 40's today and in the low 50's at home. He is not symptomatic with this. Continues on eliquis 5 mg bid for at least three.  Returns to the afib clinic 12/8. He asked to be seen due to feeling lightheaded over the last 1-2 weeks and felt like he may be back in afib. His afib is well rate controlled without any rate control on board due to brady at baseline. He did have a ETT on flecainide without any ekg issues. Good exercise tolerance for age.   Today, he denies symptoms of palpitations, chest pain, shortness of breath, orthopnea, PND, lower extremity edema, dizziness, presyncope, syncope, or neurologic sequela. The patient is tolerating medications without difficulties and is otherwise without complaint today.   Past Medical History:  Diagnosis Date  . A-fib ([place])   . Basal cell carcinoma of auricle of right ear   . BPH (benign prostatic hyperplasia)   . DJD (degenerative joint disease)    left  . ED (erectile dysfunction)   . Fatigue   . Hearing loss, sensorineural   . Hx of adenomatous colonic polyps   . Hx of major depression   . Hypertension   . Iron deficiency anemia   . Irregular heartbeat   . Nephrolithiasis     . Seasonal allergic rhinitis   . SOB (shortness of breath)    Past Surgical History:  Procedure Laterality Date  . CARDIOVERSION N/A 08/13/2015   Procedure: CARDIOVERSION;  Surgeon: Pixie Casino, MD;  Location: [facility] ENDOSCOPY;  Service: Cardiovascular;  Laterality: N/A;  . CARDIOVERSION N/A 04/04/2016   Procedure: CARDIOVERSION;  Surgeon: Sueanne Margarita, MD;  Location: [facility] ENDOSCOPY;  Service: Cardiovascular;  Laterality: N/A;  . CHOLECYSTECTOMY    . colonscopy    . herniorraphy Bilateral    had surgery x2 on the left  and right  . TONSILLECTOMY AND ADENOIDECTOMY      Current Outpatient Prescriptions  Medication Sig Dispense Refill  . amLODipine-benazepril (LOTREL) 5-20 MG capsule Take 1 capsule by mouth daily.    [name] apixaban (ELIQUIS) 5 MG TABS tablet Take 5 mg by mouth 2 (two) times daily.    [name] atorvastatin (LIPITOR) 20 MG tablet Take 20 mg by mouth daily.    . cholecalciferol (VITAMIN D) 1000 units tablet Take 1,000 Units by mouth daily.    . flecainide (TAMBOCOR) 50 MG tablet Take 1.5 tablets (75 mg total) by mouth 2 (two) times daily. 180 tablet 1  . Multiple Vitamin (MULTIVITAMIN) tablet Take 1 tablet by mouth daily. Take once a day    . polycarbophil (FIBERCON) 625 MG tablet Take 1,250 mg by mouth daily.  No current facility-administered medications for this encounter.     No Known Allergies  Social History   Social History  . Marital status: Married    Spouse name: N/A  . Number of children: N/A  . Years of education: N/A   Occupational History  . Not on file.   Social History Main Topics  . Smoking status: Former Smoker    Types: Cigarettes    Quit date: 07/15/1960  . Smokeless tobacco: Never Used  . Alcohol use No  . Drug use: Unknown  . Sexual activity: Yes   Other Topics Concern  . Not on file   Social History Narrative  . No narrative on file    Family History  Problem Relation Age of Onset  . CAD Mother   . Heart disease Mother   . Heart  attack Father   . Cancer - Prostate Brother   . Alcoholism Daughter   . Arrhythmia Daughter   . Healthy Son   . Healthy Daughter     ROS- All systems are reviewed and negative except as per the HPI above  Physical Exam: Vitals:   05/05/16 0848  BP: 130/72  Pulse: 68  Weight: 213 lb 3.2 oz (96.7 kg)  Height: 6' (1.829 m)    GEN- The patient is well appearing, alert and oriented x 3 today.   Head- normocephalic, atraumatic Eyes-  Sclera clear, conjunctiva pink Ears- hearing intact Oropharynx- clear Neck- supple, no JVP Lymph- no cervical lymphadenopathy Lungs- Clear to ausculation bilaterally, normal work of breathing Heart- slow irregular rate and rhythm, no murmurs, rubs or gallops, PMI not laterally displaced GI- soft, NT, ND, + BS Extremities- no clubbing, cyanosis, or edema MS- no significant deformity or atrophy Skin- no rash or lesion Psych- euthymic mood, full affect Neuro- strength and sensation are intact  EKG- Sinus brady at 47 bpm, IRBBB Epic records reviewed Echo-- Left ventricle: The cavity size was normal. There was mild   concentric hypertrophy. Systolic function was normal. The   estimated ejection fraction was in the range of 50% to 55%. Wall   motion was normal; there were no regional wall motion   abnormalities. The study was not technically sufficient to allow   evaluation of LV diastolic dysfunction due to atrial   fibrillation. - Aortic valve: Trileaflet; moderately thickened, moderately   calcified leaflets. There was no regurgitation. - Aortic root: The aortic root was normal in size. - Mitral valve: Structurally normal valve. There was mild   regurgitation. - Left atrium: The atrium was mildly dilated. 32m - Right ventricle: Systolic function was normal. - Right atrium: The atrium was mildly dilated. - Tricuspid valve: There was mild regurgitation. - Pulmonary arteries: Systolic pressure was within the normal   range. - Inferior vena  cava: The vessel was normal in size. The   respirophasic diameter changes were in the normal range (= 50%),   consistent with normal central venous pressure. - Pericardium, extracardiac: There was no pericardial effusion.  Assessment and Plan: 1. Paroxysmal atrial fibrillation  Recent breakthrough on flecainide 50 mg bid Increase flecanide to 75 mg bid Continue xarelto Discussed ablation if pt fails flecainide, he will have met guidelines to be considered for procedure  2. Bradycardia Asymptomatic Limits ability to use rate control  F/u afib clinic  on Monday for repeat EKG/rhtyhm/symptoms   DButch PennyC. Dailen Mcclish, A[facility] Locust CourtGGilberts [place] [phone]

## 2016-05-08 ENCOUNTER — Encounter (HOSPITAL_COMMUNITY): Payer: Self-pay | Admitting: Nurse Practitioner

## 2016-05-08 ENCOUNTER — Ambulatory Visit (HOSPITAL_COMMUNITY)
Admission: RE | Admit: 2016-05-08 | Discharge: 2016-05-08 | Disposition: A | Discharge status: Home / Self Care | Payer: Medicare Other | Source: Ambulatory Visit | Attending: Nurse Practitioner | Admitting: Nurse Practitioner

## 2016-05-08 VITALS — BP 158/72 | HR 81 | Ht 72.0 in | Wt 213.0 lb

## 2016-05-08 DIAGNOSIS — Z87442 Personal history of urinary calculi: Secondary | ICD-10-CM | POA: Diagnosis not present

## 2016-05-08 DIAGNOSIS — N529 Male erectile dysfunction, unspecified: Secondary | ICD-10-CM | POA: Insufficient documentation

## 2016-05-08 DIAGNOSIS — N400 Enlarged prostate without lower urinary tract symptoms: Secondary | ICD-10-CM | POA: Diagnosis not present

## 2016-05-08 DIAGNOSIS — Z8249 Family history of ischemic heart disease and other diseases of the circulatory system: Secondary | ICD-10-CM | POA: Diagnosis not present

## 2016-05-08 DIAGNOSIS — F329 Major depressive disorder, single episode, unspecified: Secondary | ICD-10-CM | POA: Diagnosis not present

## 2016-05-08 DIAGNOSIS — J309 Allergic rhinitis, unspecified: Secondary | ICD-10-CM | POA: Diagnosis not present

## 2016-05-08 DIAGNOSIS — Z87891 Personal history of nicotine dependence: Secondary | ICD-10-CM | POA: Insufficient documentation

## 2016-05-08 DIAGNOSIS — D509 Iron deficiency anemia, unspecified: Secondary | ICD-10-CM | POA: Insufficient documentation

## 2016-05-08 DIAGNOSIS — H905 Unspecified sensorineural hearing loss: Secondary | ICD-10-CM | POA: Diagnosis not present

## 2016-05-08 DIAGNOSIS — I480 Paroxysmal atrial fibrillation: Secondary | ICD-10-CM | POA: Insufficient documentation

## 2016-05-08 DIAGNOSIS — Z85828 Personal history of other malignant neoplasm of skin: Secondary | ICD-10-CM | POA: Insufficient documentation

## 2016-05-08 DIAGNOSIS — Z7901 Long term (current) use of anticoagulants: Secondary | ICD-10-CM | POA: Diagnosis not present

## 2016-05-08 DIAGNOSIS — I10 Essential (primary) hypertension: Secondary | ICD-10-CM | POA: Insufficient documentation

## 2016-05-08 DIAGNOSIS — R001 Bradycardia, unspecified: Secondary | ICD-10-CM | POA: Insufficient documentation

## 2016-05-08 DIAGNOSIS — M1990 Unspecified osteoarthritis, unspecified site: Secondary | ICD-10-CM | POA: Insufficient documentation

## 2016-05-08 MED ORDER — FLECAINIDE ACETATE 50 MG PO TABS: 100.0000 mg | ORAL_TABLET | Freq: Two times a day (BID) | ORAL | 2 refills | Status: DC

## 2016-05-08 NOTE — Progress Notes (Signed)
Primary Care Physician: Irven Shelling, MD Referring Physician: DCCV f/u Cardiologist: Dr. Genella Rife VIC ESCO is a 81 y.o. male with a h/o of paroxysmal afib that underwent DCCV in March 2017. When he presented he was not terribly symptomatic but he tells me now that he definitely felt better after the cardioversion. He has remained in NSR, sinus bradycardia, on no chronotropic agents. He just saw Dr Debara Pickett last month and was doing well. He  was seen by Kerin Ransom, PA, 10/24 and noted dyspnea while doing some yard work. He took his pulse and noted it was in the 80's, high for him. His EKG confirmed he was in AF with VR 80. He was started on flecainide 50 mg bid and was set up for cardioversion, which was successful. He is slow at baseline in the upper 40's today and in the low 50's at home. He is not symptomatic with this. Continues on eliquis 5 mg bid for at least three.  Returns to the afib clinic 12/8. He asked to be seen due to feeling lightheaded over the last 1-2 weeks and felt like he may be back in afib. His afib is well rate controlled without any rate control on board due to brady at baseline. He did have a ETT on flecainide without any ekg issues. Good exercise tolerance for age.   F/u increase of flecainide last week. He has been still going in and out. No significant change in intervals with change of flecainide.  Today, he denies symptoms of palpitations, chest pain, shortness of breath, orthopnea, PND, lower extremity edema, dizziness, presyncope, syncope, or neurologic sequela. The patient is tolerating medications without difficulties and is otherwise without complaint today.   Past Medical History:  Diagnosis Date  . A-fib ([place])   . Basal cell carcinoma of auricle of right ear   . BPH (benign prostatic hyperplasia)   . DJD (degenerative joint disease)    left  . ED (erectile dysfunction)   . Fatigue   . Hearing loss, sensorineural   . Hx of  adenomatous colonic polyps   . Hx of major depression   . Hypertension   . Iron deficiency anemia   . Irregular heartbeat   . Nephrolithiasis   . Seasonal allergic rhinitis   . SOB (shortness of breath)    Past Surgical History:  Procedure Laterality Date  . CARDIOVERSION N/A 08/13/2015   Procedure: CARDIOVERSION;  Surgeon: Pixie Casino, MD;  Location: [facility] ENDOSCOPY;  Service: Cardiovascular;  Laterality: N/A;  . CARDIOVERSION N/A 04/04/2016   Procedure: CARDIOVERSION;  Surgeon: Sueanne Margarita, MD;  Location: [facility] ENDOSCOPY;  Service: Cardiovascular;  Laterality: N/A;  . CHOLECYSTECTOMY    . colonscopy    . herniorraphy Bilateral    had surgery x2 on the left  and right  . TONSILLECTOMY AND ADENOIDECTOMY      Current Outpatient Prescriptions  Medication Sig Dispense Refill  . amLODipine-benazepril (LOTREL) 5-20 MG capsule Take 1 capsule by mouth daily.    [name] apixaban (ELIQUIS) 5 MG TABS tablet Take 5 mg by mouth 2 (two) times daily.    [name] atorvastatin (LIPITOR) 20 MG tablet Take 20 mg by mouth daily.    . cholecalciferol (VITAMIN D) 1000 units tablet Take 1,000 Units by mouth daily.    . flecainide (TAMBOCOR) 50 MG tablet Take 2 tablets (100 mg total) by mouth 2 (two) times daily. 180 tablet 2  . Multiple Vitamin (MULTIVITAMIN) tablet Take 1 tablet  by mouth daily. Take once a day    . polycarbophil (FIBERCON) 625 MG tablet Take 1,250 mg by mouth daily.     No current facility-administered medications for this encounter.     No Known Allergies  Social History   Social History  . Marital status: Married    Spouse name: N/A  . Number of children: N/A  . Years of education: N/A   Occupational History  . Not on file.   Social History Main Topics  . Smoking status: Former Smoker    Types: Cigarettes    Quit date: 07/15/1960  . Smokeless tobacco: Never Used  . Alcohol use No  . Drug use: Unknown  . Sexual activity: Yes   Other Topics Concern  . Not on file   Social  History Narrative  . No narrative on file    Family History  Problem Relation Age of Onset  . CAD Mother   . Heart disease Mother   . Heart attack Father   . Cancer - Prostate Brother   . Alcoholism Daughter   . Arrhythmia Daughter   . Healthy Son   . Healthy Daughter     ROS- All systems are reviewed and negative except as per the HPI above  Physical Exam: Vitals:   05/08/16 1119  BP: (!) 158/72  Pulse: 81  Weight: 213 lb (96.6 kg)  Height: 6' (1.829 m)    GEN- The patient is well appearing, alert and oriented x 3 today.   Head- normocephalic, atraumatic Eyes-  Sclera clear, conjunctiva pink Ears- hearing intact Oropharynx- clear Neck- supple, no JVP Lymph- no cervical lymphadenopathy Lungs- Clear to ausculation bilaterally, normal work of breathing Heart- slow irregular rate and rhythm, no murmurs, rubs or gallops, PMI not laterally displaced GI- soft, NT, ND, + BS Extremities- no clubbing, cyanosis, or edema MS- no significant deformity or atrophy Skin- no rash or lesion Psych- euthymic mood, full affect Neuro- strength and sensation are intact  EKG- Sinus brady at 47 bpm, IRBBB Epic records reviewed Echo-- Left ventricle: The cavity size was normal. There was mild   concentric hypertrophy. Systolic function was normal. The   estimated ejection fraction was in the range of 50% to 55%. Wall   motion was normal; there were no regional wall motion   abnormalities. The study was not technically sufficient to allow   evaluation of LV diastolic dysfunction due to atrial   fibrillation. - Aortic valve: Trileaflet; moderately thickened, moderately   calcified leaflets. There was no regurgitation. - Aortic root: The aortic root was normal in size. - Mitral valve: Structurally normal valve. There was mild   regurgitation. - Left atrium: The atrium was mildly dilated. 60mm - Right ventricle: Systolic function was normal. - Right atrium: The atrium was mildly  dilated. - Tricuspid valve: There was mild regurgitation. - Pulmonary arteries: Systolic pressure was within the normal   range. - Inferior vena cava: The vessel was normal in size. The   respirophasic diameter changes were in the normal range (= 50%),   consistent with normal central venous pressure. - Pericardium, extracardiac: There was no pericardial effusion.  Assessment and Plan: 1. Paroxysmal atrial fibrillation  Recent breakthrough on flecainide 50 mg bid Increased flecanide to 75 mg bid but still with PAF, no significant interval change He is not on rate control with flecainide due to bradycardia in SR Increase flecainde to 100 mg bid Continue xarelto Discussed ablation if pt fails flecainide, he will have met  guidelines to be considered for procedure  2. Bradycardia Asymptomatic Limits ability to use rate control  F/u afib clinic on Thursday for repeat EKG/rhtyhm/symptoms   Butch Penny C. Gabriell Casimir, [facility] [address] [place], [place] [address] [phone]

## 2016-05-08 NOTE — Patient Instructions (Signed)
Your physician has recommended you make the following change in your medication:  1)Increase flecainide 100mg  twice a day

## 2016-05-11 ENCOUNTER — Encounter (HOSPITAL_COMMUNITY): Payer: Self-pay | Admitting: Nurse Practitioner

## 2016-05-11 ENCOUNTER — Ambulatory Visit (HOSPITAL_COMMUNITY)
Admission: RE | Admit: 2016-05-11 | Discharge: 2016-05-11 | Disposition: A | Discharge status: Home / Self Care | Payer: Medicare Other | Source: Ambulatory Visit | Attending: Nurse Practitioner | Admitting: Nurse Practitioner

## 2016-05-11 VITALS — BP 140/68 | HR 63 | Ht 72.0 in | Wt 214.6 lb

## 2016-05-11 DIAGNOSIS — Z87891 Personal history of nicotine dependence: Secondary | ICD-10-CM | POA: Insufficient documentation

## 2016-05-11 DIAGNOSIS — Z85828 Personal history of other malignant neoplasm of skin: Secondary | ICD-10-CM | POA: Insufficient documentation

## 2016-05-11 DIAGNOSIS — M1990 Unspecified osteoarthritis, unspecified site: Secondary | ICD-10-CM | POA: Diagnosis not present

## 2016-05-11 DIAGNOSIS — Z87442 Personal history of urinary calculi: Secondary | ICD-10-CM | POA: Diagnosis not present

## 2016-05-11 DIAGNOSIS — R5383 Other fatigue: Secondary | ICD-10-CM | POA: Insufficient documentation

## 2016-05-11 DIAGNOSIS — N529 Male erectile dysfunction, unspecified: Secondary | ICD-10-CM | POA: Insufficient documentation

## 2016-05-11 DIAGNOSIS — R001 Bradycardia, unspecified: Secondary | ICD-10-CM | POA: Diagnosis not present

## 2016-05-11 DIAGNOSIS — Z8249 Family history of ischemic heart disease and other diseases of the circulatory system: Secondary | ICD-10-CM | POA: Diagnosis not present

## 2016-05-11 DIAGNOSIS — N400 Enlarged prostate without lower urinary tract symptoms: Secondary | ICD-10-CM | POA: Insufficient documentation

## 2016-05-11 DIAGNOSIS — H905 Unspecified sensorineural hearing loss: Secondary | ICD-10-CM | POA: Insufficient documentation

## 2016-05-11 DIAGNOSIS — D509 Iron deficiency anemia, unspecified: Secondary | ICD-10-CM | POA: Diagnosis not present

## 2016-05-11 DIAGNOSIS — F329 Major depressive disorder, single episode, unspecified: Secondary | ICD-10-CM | POA: Diagnosis not present

## 2016-05-11 DIAGNOSIS — I480 Paroxysmal atrial fibrillation: Secondary | ICD-10-CM | POA: Insufficient documentation

## 2016-05-11 DIAGNOSIS — I10 Essential (primary) hypertension: Secondary | ICD-10-CM | POA: Diagnosis not present

## 2016-05-11 DIAGNOSIS — Z7901 Long term (current) use of anticoagulants: Secondary | ICD-10-CM | POA: Diagnosis not present

## 2016-05-11 DIAGNOSIS — J302 Other seasonal allergic rhinitis: Secondary | ICD-10-CM | POA: Diagnosis not present

## 2016-05-11 MED ORDER — FLECAINIDE ACETATE 100 MG PO TABS: 100.0000 mg | ORAL_TABLET | Freq: Two times a day (BID) | ORAL | 3 refills | Status: DC

## 2016-05-11 NOTE — Progress Notes (Addendum)
Primary Care Physician: Irven Shelling, MD Referring Physician: DCCV f/u Cardiologist: Dr. Genella Rife Corey [name] is a 81 y.o. male with a h/o of paroxysmal afib that underwent DCCV in March 2017. When he presented he was not terribly symptomatic but he tells me now that he definitely felt better after the cardioversion. He has remained in NSR, sinus bradycardia, on no chronotropic agents. He just saw Dr Debara Pickett last month and was doing well. He  was seen by Kerin Ransom, PA, 10/24 and noted dyspnea while doing some yard work. He took his pulse and noted it was in the 80's, high for him. His EKG confirmed he was in AF with VR 80. He was started on flecainide 50 mg bid and was set up for cardioversion, which was successful. He is slow at baseline in the upper 40's today and in the low 50's at home. He is not symptomatic with this. Continues on eliquis 5 mg bid   Returns to the afib clinic 12/8. He asked to be seen due to feeling lightheaded over the last 1-2 weeks and felt like he may be back in afib. His afib is well rate controlled without any rate control on board due to brady at baseline. He did have a ETT on flecainide without any ekg issues. Good exercise tolerance for age.   F/u increase of flecainide last week. He has been still going in and out. No significant change in intervals with change of flecainide.  Return to afib clinic 12/14 and is back in SR with increase of flecainide 100 mg bid. He feels much better in SR. Continue on eliquis.  Today, he denies symptoms of palpitations, chest pain, shortness of breath, orthopnea, PND, lower extremity edema, dizziness, presyncope, syncope, or neurologic sequela. The patient is tolerating medications without difficulties and is otherwise without complaint today.   Past Medical History:  Diagnosis Date  . A-fib ([place])   . Basal cell carcinoma of auricle of right ear   . BPH (benign prostatic hyperplasia)   . DJD (degenerative  joint disease)    left  . ED (erectile dysfunction)   . Fatigue   . Hearing loss, sensorineural   . Hx of adenomatous colonic polyps   . Hx of major depression   . Hypertension   . Iron deficiency anemia   . Irregular heartbeat   . Nephrolithiasis   . Seasonal allergic rhinitis   . SOB (shortness of breath)    Past Surgical History:  Procedure Laterality Date  . CARDIOVERSION N/A 08/13/2015   Procedure: CARDIOVERSION;  Surgeon: Pixie Casino, MD;  Location: [facility] ENDOSCOPY;  Service: Cardiovascular;  Laterality: N/A;  . CARDIOVERSION N/A 04/04/2016   Procedure: CARDIOVERSION;  Surgeon: Sueanne Margarita, MD;  Location: [facility] ENDOSCOPY;  Service: Cardiovascular;  Laterality: N/A;  . CHOLECYSTECTOMY    . colonscopy    . herniorraphy Bilateral    had surgery x2 on the left  and right  . TONSILLECTOMY AND ADENOIDECTOMY      Current Outpatient Prescriptions  Medication Sig Dispense Refill  . amLODipine-benazepril (LOTREL) 5-20 MG capsule Take 1 capsule by mouth daily.    [name] apixaban (ELIQUIS) 5 MG TABS tablet Take 5 mg by mouth 2 (two) times daily.    [name] atorvastatin (LIPITOR) 20 MG tablet Take 20 mg by mouth daily.    . cholecalciferol (VITAMIN D) 1000 units tablet Take 1,000 Units by mouth daily.    . flecainide (TAMBOCOR) 50 MG tablet  Take 2 tablets (100 mg total) by mouth 2 (two) times daily. 180 tablet 2  . Multiple Vitamin (MULTIVITAMIN) tablet Take 1 tablet by mouth daily. Take once a day    . polycarbophil (FIBERCON) 625 MG tablet Take 1,250 mg by mouth daily.     No current facility-administered medications for this encounter.     No Known Allergies  Social History   Social History  . Marital status: Married    Spouse name: N/A  . Number of children: N/A  . Years of education: N/A   Occupational History  . Not on file.   Social History Main Topics  . Smoking status: Former Smoker    Types: Cigarettes    Quit date: 07/15/1960  . Smokeless tobacco: Never Used  .  Alcohol use No  . Drug use: Unknown  . Sexual activity: Yes   Other Topics Concern  . Not on file   Social History Narrative  . No narrative on file    Family History  Problem Relation Age of Onset  . CAD Mother   . Heart disease Mother   . Heart attack Father   . Cancer - Prostate Brother   . Alcoholism Daughter   . Arrhythmia Daughter   . Healthy Son   . Healthy Daughter     ROS- All systems are reviewed and negative except as per the HPI above  Physical Exam: Vitals:   05/11/16 1120  BP: 140/68  Pulse: 63  Weight: 214 lb 9.6 oz (97.3 kg)  Height: 6' (1.829 m)    GEN- The patient is well appearing, alert and oriented x 3 today.   Head- normocephalic, atraumatic Eyes-  Sclera clear, conjunctiva pink Ears- hearing intact Oropharynx- clear Neck- supple, no JVP Lymph- no cervical lymphadenopathy Lungs- Clear to ausculation bilaterally, normal work of breathing Heart- slow irregular rate and rhythm, no murmurs, rubs or gallops, PMI not laterally displaced GI- soft, NT, ND, + BS Extremities- no clubbing, cyanosis, or edema MS- no significant deformity or atrophy Skin- no rash or lesion Psych- euthymic mood, full affect Neuro- strength and sensation are intact  EKG- Sinus rhythm at 63 bpm, pr int 213 ms, qrs int 110 ms, qtc 478 ms Epic records reviewed Echo-- Left ventricle: The cavity size was normal. There was mild   concentric hypertrophy. Systolic function was normal. The   estimated ejection fraction was in the range of 50% to 55%. Wall   motion was normal; there were no regional wall motion   abnormalities. The study was not technically sufficient to allow   evaluation of LV diastolic dysfunction due to atrial   fibrillation. - Aortic valve: Trileaflet; moderately thickened, moderately   calcified leaflets. There was no regurgitation. - Aortic root: The aortic root was normal in size. - Mitral valve: Structurally normal valve. There was mild    regurgitation. - Left atrium: The atrium was mildly dilated. 87m - Right ventricle: Systolic function was normal. - Right atrium: The atrium was mildly dilated. - Tricuspid valve: There was mild regurgitation. - Pulmonary arteries: Systolic pressure was within the normal   range. - Inferior vena cava: The vessel was normal in size. The   respirophasic diameter changes were in the normal range (= 50%),   consistent with normal central venous pressure. - Pericardium, extracardiac: There was no pericardial effusion.  Assessment and Plan: 1. Paroxysmal atrial fibrillation  Recent breakthrough on flecainide 50 mg bid Returned to SR with increase of flecainide to 100 mg  bid He is not on rate control with flecainide due to bradycardia in SR Continue xarelto Discussed ablation if pt fails flecainide, he will have met guidelines to be considered for procedure  2. Bradycardia Asymptomatic Limits ability to use rate control  F/u with Dr. Debara Pickett as scheduled 9/19 afib clinic as needed  Butch Penny C. Carroll, [facility] [address] [place], [place] [address] [phone]

## 2016-05-11 NOTE — Addendum Note (Signed)
Encounter addended by: Sherran Needs, NP on: 05/11/2016 11:56 AM<BR>    Actions taken: Sign clinical note

## 2016-07-17 ENCOUNTER — Encounter: Payer: Self-pay | Admitting: Internal Medicine

## 2016-07-17 ENCOUNTER — Ambulatory Visit (INDEPENDENT_AMBULATORY_CARE_PROVIDER_SITE_OTHER): Payer: Medicare Other | Admitting: Internal Medicine

## 2016-07-17 VITALS — BP 112/64 | HR 53 | Ht 72.0 in | Wt 213.0 lb

## 2016-07-17 DIAGNOSIS — Z7901 Long term (current) use of anticoagulants: Secondary | ICD-10-CM | POA: Diagnosis not present

## 2016-07-17 DIAGNOSIS — R0602 Shortness of breath: Secondary | ICD-10-CM | POA: Diagnosis not present

## 2016-07-17 DIAGNOSIS — I481 Persistent atrial fibrillation: Secondary | ICD-10-CM

## 2016-07-17 DIAGNOSIS — Z79899 Other long term (current) drug therapy: Secondary | ICD-10-CM

## 2016-07-17 DIAGNOSIS — Z5181 Encounter for therapeutic drug level monitoring: Secondary | ICD-10-CM | POA: Diagnosis not present

## 2016-07-17 DIAGNOSIS — I4819 Other persistent atrial fibrillation: Secondary | ICD-10-CM

## 2016-07-17 NOTE — Patient Instructions (Addendum)
Exercise Stress Electrocardiogram An exercise stress electrocardiogram is a test that is done to evaluate the blood supply to your heart. This test may also be called exercise stress electrocardiography. The test is done while you are walking on a treadmill. The goal of this test is to raise your heart rate. This test is done to find areas of poor blood flow to the heart by determining the extent of coronary artery disease (CAD). CAD is defined as narrowing in one or more heart (coronary) arteries of more than 70%. If you have an abnormal test result, this may mean that you are not getting adequate blood flow to your heart during exercise. Additional testing may be needed to understand why your test was abnormal. Tell a health care provider about:  Any allergies you have.  All medicines you are taking, including vitamins, herbs, eye drops, creams, and over-the-counter medicines.  Any problems you or family members have had with anesthetic medicines.  Any blood disorders you have.  Any surgeries you have had.  Any medical conditions you have.  Possibility of pregnancy, if this applies. What are the risks? Generally, this is a safe procedure. However, as with any procedure, complications can occur. Possible complications can include:  Pain or pressure in the following areas:  Chest.  Jaw or neck.  Between your shoulder blades.  Radiating down your left arm.  Dizziness or light-headedness.  Shortness of breath.  Increased or irregular heartbeats.  Nausea or vomiting.  Heart attack (rare). What happens before the procedure?  Avoid all forms of caffeine 24 hours before your test or as directed by your health care provider. This includes coffee, tea (even decaffeinated tea), caffeinated sodas, chocolate, cocoa, and certain pain medicines.  Follow your health care provider's instructions regarding eating and drinking before the test.  Take your medicines as directed at  regular times with water unless instructed otherwise. Exceptions may include:  If you have diabetes, ask how you are to take your insulin or pills. It is common to adjust insulin dosing the morning of the test.  If you are taking beta-blocker medicines, it is important to talk to your health care provider about these medicines well before the date of your test. Taking beta-blocker medicines may interfere with the test. In some cases, these medicines need to be changed or stopped 24 hours or more before the test.  If you wear a nitroglycerin patch, it may need to be removed prior to the test. Ask your health care provider if the patch should be removed before the test.  If you use an inhaler for any breathing condition, bring it with you to the test.  If you are an outpatient, bring a snack so you can eat right after the stress phase of the test.  Do not smoke for 4 hours prior to the test or as directed by your health care provider.  Do not apply lotions, powders, creams, or oils on your chest prior to the test.  Wear loose-fitting clothes and comfortable shoes for the test. This test involves walking on a treadmill. What happens during the procedure?  Multiple patches (electrodes) will be put on your chest. If needed, small areas of your chest may have to be shaved to get better contact with the electrodes. Once the electrodes are attached to your body, multiple wires will be attached to the electrodes and your heart rate will be monitored.  Your heart will be monitored both at rest and while exercising.  You will walk on a treadmill. The treadmill will be started at a slow pace. The treadmill speed and incline will gradually be increased to raise your heart rate. What happens after the procedure?  Your heart rate and blood pressure will be monitored after the test.  You may return to your normal schedule including diet, activities, and medicines, unless your health care provider tells  you otherwise. This information is not intended to replace advice given to you by your health care provider. Make sure you discuss any questions you have with your health care provider. Document Released: 05/12/2000 Document Revised: 10/21/2015 Document Reviewed: 01/20/2013 Elsevier Interactive Patient Education  2017 Elsevier Inc.   Medication Instructions:   NO CHANGE  Testing/Procedures:  Your physician has requested that you have an exercise tolerance test. For further information please visit [email or web address][email or web address]. Please also follow instruction sheet, as given.    Follow-Up:  Your physician recommends that you schedule a follow-up appointment AFTER TESTING COMPLETE

## 2016-07-18 DIAGNOSIS — Z79899 Other long term (current) drug therapy: Secondary | ICD-10-CM

## 2016-07-18 DIAGNOSIS — Z5181 Encounter for therapeutic drug level monitoring: Secondary | ICD-10-CM | POA: Insufficient documentation

## 2016-07-18 DIAGNOSIS — R0602 Shortness of breath: Secondary | ICD-10-CM | POA: Insufficient documentation

## 2016-07-18 NOTE — Progress Notes (Signed)
Cardiology Office Note   Date:  07/18/2016   ID:  [name], [name] 1934-08-07, MRN [id number]  PCP:  Lillia Mountain, MD   CC: Doing well, no complaints   History of Present Illness: Corey [name] is a 82 y.o. male who presents for cardiology evaluation.  He is seen by me for the first time today.  He is seen at the request of Dr. Kirby Funk.  The patient is being seen because of atrial fibrillation of unknown duration.  The patient was seen for a physical in November 2016 by Dr. Valentina Lucks.  No EKG was done at that time according to the patient, but no mention of atrial fibrillation or irregular pulse wasn't mentioned.  Subsequently had a nurse practitioner house call about a week ago who performed a physical examination on him at his home.  She noted an irregular pulse.  [name] office was notified and he was seen in Dr. Jone Baseman office on 07/14/15 by Ida Rogue, NP, who obtained an electrocardiogram and confirmed the presence of atrial fibrillation. The patient himself is really not aware of his heart rate.  He does not know how long he may have been in the atrial fibrillation.  He began experiencing fatigue several years ago and because of that stop playing golf about 2 years ago.  He would give out after playing about 12 holes of golf.  He has not had any symptoms of congestive heart failure.  He sleeps on one pillow.  He does not have any difficulty climbing stairs.  He has had a sense of vague chest tightness at times.  There is no radiation to the arms.  The past 6 months the patient has also had occasional night sweats which are new. The patient has had some aching in the muscles of his legs.  He is on statin therapy. He has not had any TIA symptoms.  He has not had any dizziness or syncope.  Corey [name] is seen in the office today is a new patient to me however he was recently seen by Dr. Patty Sermons. Unfortunately Dr. Patty Sermons just retired and as Mr.  [name] wife is my patient, he is kindly requested my services. This or Curren says over the past year he's had some worsening fatigue and decreased energy levels. In fact she's given up a lot of activities including golf which he enjoyed playing. He thought it was just related to being older. He also stopped doing a lot of lawn work and other activities. Recently had a home physical where he was found to be out of rhythm and then had an EKG which demonstrated atrial fibrillation. He was started on Eliquis and was seen by Dr. Patty Sermons. An echocardiogram and stress test were ordered. Both studies showed low normal LVEF of 50-55% however the nuclear stress test showed no ischemia. He had only mild left atrial enlargement on his echocardiogram. Apparently Dr. Patty Sermons had discussed the possibility of a cardioversion with him but had recommended at least 1 month of anticoagulation. Today is his 28th day of Eliquis. He has not missed any doses. EKG shows persistent atrial fibrillation.  I saw Corey [name] back today in the office for follow-up. He underwent successful cardioversion to sinus rhythm and maintains that today. He notes that he's had some improvement in his energy and was able to mow his lawn without having to stop. He started walking again and is doing about a mile a day. Generally seems to be thriving. Heart  rate is noted to be low in the 50s but he noted that it was in the 50s for most of his life. He denies any chest pain, shortness of breath and as mentioned his energy level has improved. He seems to be tolerating Eliquis without any bleeding complications.  02/16/2016  Corey [name] is doing well without any complaints today. He is maintaining a sinus bradycardia with heart rate of 47 and is asymptomatic. He is not on any AV nodal blocking medications. He is on Eliquis 5 mg twice a day and has no complaints. He denies any bleeding. He is on Lipitor for dyslipidemia but has not had  a recent lipid profile. He denies chest pain or shortness of breath with exertion.  07/17/2016  Corey [name] returns today for follow-up. He is currently in sinus bradycardia with first-degree AV block at 53. He remains on Eliquis for anticoagulation. He was previously on flecainide 50 mg twice daily, but given recent palpitations his medication was increased to 75 mg twice daily and then 100 mg twice daily in the A. fib clinic. He reports good control of her palpitations now although heart rate is low in the 50s. He says with exercise he gets short of breath and has some fatigue which could indicate some chronotropic incompetence.  Past Medical History:  Diagnosis Date  . A-fib ([facility])   . Basal cell carcinoma of auricle of right ear   . BPH (benign prostatic hyperplasia)   . DJD (degenerative joint disease)    left  . ED (erectile dysfunction)   . Fatigue   . Hearing loss, sensorineural   . Hx of adenomatous colonic polyps   . Hx of major depression   . Hypertension   . Iron deficiency anemia   . Irregular heartbeat   . Nephrolithiasis   . Seasonal allergic rhinitis   . SOB (shortness of breath)     Past Surgical History:  Procedure Laterality Date  . CARDIOVERSION N/A 08/13/2015   Procedure: CARDIOVERSION;  Surgeon: Chrystie Nose, MD;  Location[facility] ENDOSCOPY;  Service: Cardiovascular;  Laterality: N/A;  . CARDIOVERSION N/A 04/04/2016   Procedure: CARDIOVERSION;  Surgeon: Quintella Reichert, MD;  Location: [facility] ENDOSCOPY;  Service: Cardiovascular;  Laterality: N/A;  . CHOLECYSTECTOMY    . colonscopy    . herniorraphy Bilateral    had surgery x2 on the left  and right  . TONSILLECTOMY AND ADENOIDECTOMY       Current Outpatient Prescriptions  Medication Sig Dispense Refill  . amLODipine-benazepril (LOTREL) 5-20 MG capsule Take 1 capsule by mouth daily.    [name] apixaban (ELIQUIS) 5 MG TABS tablet Take 5 mg by mouth 2 (two) times daily.    [name] atorvastatin (LIPITOR) 20 MG tablet Take  20 mg by mouth daily.    . cholecalciferol (VITAMIN D) 1000 units tablet Take 1,000 Units by mouth daily.    . flecainide (TAMBOCOR) 100 MG tablet Take 1 tablet (100 mg total) by mouth 2 (two) times daily. 60 tablet 3  . Multiple Vitamin (MULTIVITAMIN) tablet Take 1 tablet by mouth daily. Take once a day    . polycarbophil (FIBERCON) 625 MG tablet Take 1,250 mg by mouth daily.     No current facility-administered medications for this visit.     Allergies:   Patient has no known allergies.    Social History:  The patient  reports that he quit smoking about 56 years ago. His smoking use included Cigarettes. He has never used smokeless tobacco.  He reports that he does not drink alcohol.   Family History:  The patient's family history includes Alcoholism in his daughter; Arrhythmia in his daughter; CAD in his mother; Cancer - Prostate in his brother; Healthy in his daughter and son; Heart attack in his father; Heart disease in his mother. history father died of a heart attack at age 23.  Mother died of postoperative hemorrhage after heart surgery at age 48   ROS:  Please see the history of present illness.   Otherwise, review of systems are positive for none.   All other systems are reviewed and negative.    PHYSICAL EXAM: VS:  BP 112/64   Pulse (!) 53   Ht 6' (1.829 m)   Wt 213 lb (96.6 kg)   BMI 28.89 kg/m  , BMI Body mass index is 28.89 kg/m. GEN: Well nourished, well developed, in no acute distress  HEENT: normal  Neck: no JVD, carotid bruits, or masses Cardiac: Regular bradycardia Respiratory:  clear to auscultation bilaterally, normal work of breathing GI: soft, nontender, nondistended, + BS MS: no deformity or atrophy  Skin: warm and dry, no rash Neuro:  Strength and sensation are intact Psych: euthymic mood, full affect   EKG:  Sinus bradycardia at 53 with first-degree AV block  Recent Labs: 08/11/2015: TSH 1.92 02/16/2016: ALT 16 03/21/2016: BUN 17; Creat 0.99;  Hemoglobin 13.4; Platelets 335; Potassium 5.0; Sodium 139    Lipid Panel    Component Value Date/Time   CHOL 163 02/16/2016 0826   TRIG 86 02/16/2016 0826   HDL 77 02/16/2016 0826   CHOLHDL 2.1 02/16/2016 0826   VLDL 17 02/16/2016 0826   LDLCALC 69 02/16/2016 0826      Wt Readings from Last 3 Encounters:  07/17/16 213 lb (96.6 kg)  05/11/16 214 lb 9.6 oz (97.3 kg)  05/08/16 213 lb (96.6 kg)    ASSESSMENT AND PLAN:  1.  Atrial fibrillation - CHADSVASC score of 3 on Eliquis 2.  Essential hypertension 3.  History of hypercholesterolemia 4.  DOE   Orders Placed This Encounter  Procedures  . Exercise Tolerance Test  . EKG 12-Lead   PLAN:    Mr. Diclemente Reports some more recent dyspnea on exertion which seems to be correlated with an increase in his flecainide. Heart rate is low but he's been bradycardic in the past. I wonder if he could have some degree of chronotropic incompetence on flecainide 100 mg twice daily. I recommend a repeat exercise tolerance test. We'll follow up with those results.  Chrystie Nose, MD, [facility] Attending Cardiologist [facility]  07/18/2016 6:10 PM

## 2016-07-21 ENCOUNTER — Telehealth (HOSPITAL_COMMUNITY): Payer: Self-pay

## 2016-07-21 NOTE — Telephone Encounter (Signed)
Encounter complete. 

## 2016-07-26 ENCOUNTER — Ambulatory Visit (HOSPITAL_COMMUNITY)
Admission: RE | Admit: 2016-07-26 | Discharge: 2016-07-26 | Disposition: A | Discharge status: Home / Self Care | Payer: Medicare Other | Source: Ambulatory Visit | Attending: Cardiovascular Disease | Admitting: Cardiovascular Disease

## 2016-07-26 DIAGNOSIS — R0602 Shortness of breath: Secondary | ICD-10-CM | POA: Insufficient documentation

## 2016-07-26 LAB — EXERCISE TOLERANCE TEST
CHL CUP MPHR: 141 {beats}/min
CHL CUP RESTING HR STRESS: 46 {beats}/min
CHL RATE OF PERCEIVED EXERTION: 18
CSEPEDS: 0 s
Estimated workload: 8.5 METS
Exercise duration (min): 7 min
Peak HR: 131 {beats}/min
Percent HR: 92 %

## 2016-08-03 ENCOUNTER — Encounter: Payer: Self-pay | Admitting: Internal Medicine

## 2016-08-03 ENCOUNTER — Ambulatory Visit (INDEPENDENT_AMBULATORY_CARE_PROVIDER_SITE_OTHER): Payer: Medicare Other | Admitting: Internal Medicine

## 2016-08-03 VITALS — BP 142/76 | HR 52 | Ht 72.0 in | Wt 213.0 lb

## 2016-08-03 DIAGNOSIS — R0602 Shortness of breath: Secondary | ICD-10-CM | POA: Diagnosis not present

## 2016-08-03 DIAGNOSIS — Z79899 Other long term (current) drug therapy: Secondary | ICD-10-CM

## 2016-08-03 DIAGNOSIS — Z5181 Encounter for therapeutic drug level monitoring: Secondary | ICD-10-CM | POA: Diagnosis not present

## 2016-08-03 DIAGNOSIS — I480 Paroxysmal atrial fibrillation: Secondary | ICD-10-CM | POA: Diagnosis not present

## 2016-08-03 NOTE — Patient Instructions (Signed)
Your physician wants you to follow-up in: 6 months with Dr. Hilty. You will receive a reminder letter in the mail two months in advance. If you don't receive a letter, please call our office to schedule the follow-up appointment.    

## 2016-08-03 NOTE — Progress Notes (Signed)
Cardiology Office Note   Date:  08/03/2016   ID:  [name], [name] 1934/07/24, MRN [id number]  PCP:  Lillia Mountain, MD   CC: Follow-up stress test   History of Present Illness: Corey [name] is a 82 y.o. male who presents for cardiology evaluation.  He is seen by me for the first time today.  He is seen at the request of Dr. Kirby Funk.  The patient is being seen because of atrial fibrillation of unknown duration.  The patient was seen for a physical in November 2016 by Dr. Valentina Lucks.  No EKG was done at that time according to the patient, but no mention of atrial fibrillation or irregular pulse wasn't mentioned.  Subsequently had a nurse practitioner house call about a week ago who performed a physical examination on him at his home.  She noted an irregular pulse.  [name] was notified and he was seen in Dr. Jone [name] office on 07/14/15 by Ida Rogue, NP, who obtained an electrocardiogram and confirmed the presence of atrial fibrillation. The patient himself is really not aware of his heart rate.  He does not know how long he may have been in the atrial fibrillation.  He began experiencing fatigue several years ago and because of that stop playing golf about 2 years ago.  He would give out after playing about 12 holes of golf.  He has not had any symptoms of congestive heart failure.  He sleeps on one pillow.  He does not have any difficulty climbing stairs.  He has had a sense of vague chest tightness at times.  There is no radiation to the arms.  The past 6 months the patient has also had occasional night sweats which are new. The patient has had some aching in the muscles of his legs.  He is on statin therapy. He has not had any TIA symptoms.  He has not had any dizziness or syncope.  Corey [name] is seen in the office today is a new patient to me however he was recently seen by Dr. Patty Sermons. Unfortunately Dr. Patty Sermons just retired and as Mr.  [name] wife is my patient, he is kindly requested my services. This or [name] says over the past year he's had some worsening fatigue and decreased energy levels. In fact she's given up a lot of activities including golf which he enjoyed playing. He thought it was just related to being older. He also stopped doing a lot of lawn work and other activities. Recently had a home physical where he was found to be out of rhythm and then had an EKG which demonstrated atrial fibrillation. He was started on Eliquis and was seen by Dr. Patty Sermons. An echocardiogram and stress test were ordered. Both studies showed low normal LVEF of 50-55% however the nuclear stress test showed no ischemia. He had only mild left atrial enlargement on his echocardiogram. Apparently Dr. Patty Sermons had discussed the possibility of a cardioversion with him but had recommended at least 1 month of anticoagulation. Today is his 28th day of Eliquis. He has not missed any doses. EKG shows persistent atrial fibrillation.  I saw Corey [name] back today in the office for follow-up. He underwent successful cardioversion to sinus rhythm and maintains that today. He notes that he's had some improvement in his energy and was able to mow his lawn without having to stop. He started walking again and is doing about a mile a day. Generally seems to be thriving. Heart rate  is noted to be low in the 50s but he noted that it was in the 50s for most of his life. He denies any chest pain, shortness of breath and as mentioned his energy level has improved. He seems to be tolerating Eliquis without any bleeding complications.  02/16/2016  Corey [name] is doing well without any complaints today. He is maintaining a sinus bradycardia with heart rate of 47 and is asymptomatic. He is not on any AV nodal blocking medications. He is on Eliquis 5 mg twice a day and has no complaints. He denies any bleeding. He is on Lipitor for dyslipidemia but has not had  a recent lipid profile. He denies chest pain or shortness of breath with exertion.  07/17/2016  [name] returns today for follow-up. He is currently in sinus bradycardia with first-degree AV block at 53. He remains on Eliquis for anticoagulation. He was previously on flecainide 50 mg twice daily, but given recent palpitations his medication was increased to 75 mg twice daily and then 100 mg twice daily in the A. fib clinic. He reports good control of her palpitations now although heart rate is low in the 50s. He says with exercise he gets short of breath and has some fatigue which could indicate some chronotropic incompetence.  08/03/2016  Corey [name] returns today for follow-up of his stress test. He was able to exercise for 7 minutes and achieved a max predicted heart rate of 92%. He achieved a heart rate of 85% max predicted after 4.5 minutes and had recovery after 7 minutes. There is no evidence of chronotropic incompetence. The study was negative for ischemia. He reports the shortness of breath has improved significantly. He's been able to do some heavy yard work since then without any difficulty. I suspect his symptoms actually were related to probably atrial mechanical inactivity with his recent A. fib which was slow to recover despite the fact that he is flecainide has restored sinus rhythm.  Past Medical History:  Diagnosis Date  . A-fib (HCC)   . Basal cell carcinoma of auricle of right ear   . BPH (benign prostatic hyperplasia)   . DJD (degenerative joint disease)    left  . ED (erectile dysfunction)   . Fatigue   . Hearing loss, sensorineural   . Hx of adenomatous colonic polyps   . Hx of major depression   . Hypertension   . Iron deficiency anemia   . Irregular heartbeat   . Nephrolithiasis   . Seasonal allergic rhinitis   . SOB (shortness of breath)     Past Surgical History:  Procedure Laterality Date  . CARDIOVERSION N/A 08/13/2015   Procedure: CARDIOVERSION;   Surgeon: Chrystie Nose, MD;  Location: [facility] ENDOSCOPY;  Service: Cardiovascular;  Laterality: N/A;  . CARDIOVERSION N/A 04/04/2016   Procedure: CARDIOVERSION;  Surgeon: Quintella Reichert, MD;  Location: [facility] ENDOSCOPY;  Service: Cardiovascular;  Laterality: N/A;  . CHOLECYSTECTOMY    . colonscopy    . herniorraphy Bilateral    had surgery x2 on the left  and right  . TONSILLECTOMY AND ADENOIDECTOMY       Current Outpatient Prescriptions  Medication Sig Dispense Refill  . amLODipine-benazepril (LOTREL) 5-20 MG capsule Take 1 capsule by mouth daily.    [name] apixaban (ELIQUIS) 5 MG TABS tablet Take 5 mg by mouth 2 (two) times daily.    [name] atorvastatin (LIPITOR) 20 MG tablet Take 20 mg by mouth daily.    . cholecalciferol (VITAMIN D)  1000 units tablet Take 1,000 Units by mouth daily.    . flecainide (TAMBOCOR) 100 MG tablet Take 1 tablet (100 mg total) by mouth 2 (two) times daily. 60 tablet 3  . Multiple Vitamin (MULTIVITAMIN) tablet Take 1 tablet by mouth daily. Take once a day    . polycarbophil (FIBERCON) 625 MG tablet Take 1,250 mg by mouth daily.     No current facility-administered medications for this visit.     Allergies:   Patient has no known allergies.    Social History:  The patient  reports that he quit smoking about 56 years ago. His smoking use included Cigarettes. He has never used smokeless tobacco. He reports that he does not drink alcohol.   Family History:  The patient's family history includes Alcoholism in his daughter; Arrhythmia in his daughter; CAD in his mother; Cancer - Prostate in his brother; Healthy in his daughter and son; Heart attack in his father; Heart disease in his mother. history father died of a heart attack at age 85.  Mother died of postoperative hemorrhage after heart surgery at age 61   ROS:  Please see the history of present illness.   Otherwise, review of systems are positive for none.   All other systems are reviewed and negative.    PHYSICAL  EXAM: VS:  BP (!) 142/76   Pulse (!) 52   Ht 6' (1.829 m)   Wt 213 lb (96.6 kg)   SpO2 97%   BMI 28.89 kg/m  , BMI Body mass index is 28.89 kg/m. GEN: Well nourished, well developed, in no acute distress  HEENT: normal  Neck: no JVD, carotid bruits, or masses Cardiac: Regular bradycardia Respiratory:  clear to auscultation bilaterally, normal work of breathing GI: soft, nontender, nondistended, + BS MS: no deformity or atrophy  Skin: warm and dry, no rash Neuro:  Strength and sensation are intact Psych: euthymic mood, full affect   EKG:  Deferred  Recent Labs: 08/11/2015: TSH 1.92 02/16/2016: ALT 16 03/21/2016: BUN 17; Creat 0.99; Hemoglobin 13.4; Platelets 335; Potassium 5.0; Sodium 139    Lipid Panel    Component Value Date/Time   CHOL 163 02/16/2016 0826   TRIG 86 02/16/2016 0826   HDL 77 02/16/2016 0826   CHOLHDL 2.1 02/16/2016 0826   VLDL 17 02/16/2016 0826   LDLCALC 69 02/16/2016 0826      Wt Readings from Last 3 Encounters:  08/03/16 213 lb (96.6 kg)  07/17/16 213 lb (96.6 kg)  05/11/16 214 lb 9.6 oz (97.3 kg)    ASSESSMENT AND PLAN:  1.  Atrial fibrillation - CHADSVASC score of 3 on Eliquis 2.  Essential hypertension 3.  History of hypercholesterolemia 4.  DOE - low risk GXT without chronotropic incompetence  No orders of the defined types were placed in this encounter.  PLAN:    Corey [name] Had a negative exercise stress test without evidence of chronotropic incompetence. His shortness of breath is improved and I suspect this was related to his recent A. fib and probably atrial mechanical disarray which has improved. This suggests he is very symptomatic with his A. fib and if he should have breakthrough A. fib in the future, he would be a good candidate for A. fib ablation.  Follow-up with me in 6 months.  Chrystie Nose, MD, [facility] Attending Cardiologist [facility]  08/03/2016 10:44 AM

## 2016-10-02 ENCOUNTER — Other Ambulatory Visit (HOSPITAL_COMMUNITY): Payer: Self-pay | Admitting: Nurse Practitioner

## 2017-01-30 ENCOUNTER — Ambulatory Visit (INDEPENDENT_AMBULATORY_CARE_PROVIDER_SITE_OTHER): Payer: Medicare Other | Admitting: Internal Medicine

## 2017-01-30 ENCOUNTER — Encounter: Payer: Self-pay | Admitting: Internal Medicine

## 2017-01-30 VITALS — BP 140/64 | HR 43 | Ht 72.0 in | Wt 198.2 lb

## 2017-01-30 DIAGNOSIS — I10 Essential (primary) hypertension: Secondary | ICD-10-CM | POA: Diagnosis not present

## 2017-01-30 DIAGNOSIS — Z79899 Other long term (current) drug therapy: Secondary | ICD-10-CM

## 2017-01-30 DIAGNOSIS — Z7901 Long term (current) use of anticoagulants: Secondary | ICD-10-CM | POA: Diagnosis not present

## 2017-01-30 DIAGNOSIS — I480 Paroxysmal atrial fibrillation: Secondary | ICD-10-CM | POA: Diagnosis not present

## 2017-01-30 DIAGNOSIS — Z5181 Encounter for therapeutic drug level monitoring: Secondary | ICD-10-CM | POA: Diagnosis not present

## 2017-01-30 DIAGNOSIS — E785 Hyperlipidemia, unspecified: Secondary | ICD-10-CM | POA: Diagnosis not present

## 2017-01-30 DIAGNOSIS — R0602 Shortness of breath: Secondary | ICD-10-CM | POA: Diagnosis not present

## 2017-01-30 NOTE — Patient Instructions (Addendum)
Your physician recommends that you return for lab work TODAY - CBC, BMET, lipid  Your physician wants you to follow-up in: ONE YEAR with Dr. Debara Pickett. You will receive a reminder letter in the mail two months in advance. If you don't receive a letter, please call our office to schedule the follow-up appointment.

## 2017-01-30 NOTE — Progress Notes (Signed)
Cardiology Office Note   Date:  01/30/2017   ID:  Corey Corey [name], Corey Corey [name] 10/28/1934, MRN [id number]  PCP:  Corey Funk, MD   CC: No complaints   History of Present Illness: Corey Corey [name] is a 82 y.o. male who presents for cardiology evaluation.  He is seen by me for the first time today.  He is seen at the request of Dr. Kirby [name].  The patient is being seen because of atrial fibrillation of unknown duration.  The patient was seen for a physical in November 2016 by Dr. Valentina [name].  No EKG was done at that time according to the patient, but no mention of atrial fibrillation or irregular pulse wasn't mentioned.  Subsequently had a nurse practitioner house call about a week ago who performed a physical examination on him at his home.  She noted an irregular pulse.  Corey Corey [name]'s office was notified and he was seen in Dr. Jone Corey [name] office on 07/14/15 by Corey Rogue, NP, who obtained an electrocardiogram and confirmed the presence of atrial fibrillation. The patient himself is really not aware of his heart rate.  He does not know how long he may have been in the atrial fibrillation.  He began experiencing fatigue several years ago and because of that stop playing golf about 2 years ago.  He would give out after playing about 12 holes of golf.  He has not had any symptoms of congestive heart failure.  He sleeps on one pillow.  He does not have any difficulty climbing stairs.  He has had a sense of vague chest tightness at times.  There is no radiation to the arms.  The past 6 months the patient has also had occasional night sweats which are new. The patient has had some aching in the muscles of his legs.  He is on statin therapy. He has not had any TIA symptoms.  He has not had any dizziness or syncope.  Corey Corey [name] is seen in the office today is a new patient to me however he was recently seen by Corey Corey [name]. Unfortunately Corey Corey [name] just retired and as Corey Corey [name] wife is my  patient, he is kindly requested my services. This or Corey [name] says over the past year he's had some worsening fatigue and decreased energy levels. In fact she's given up a lot of activities including golf which he enjoyed playing. He thought it was just related to being older. He also stopped doing a lot of lawn work and other activities. Recently had a home physical where he was found to be out of rhythm and then had an EKG which demonstrated atrial fibrillation. He was started on Eliquis and was seen by Corey Corey [name]. An echocardiogram and stress test were ordered. Both studies showed low normal LVEF of 50-55% however the nuclear stress test showed no ischemia. He had only mild left atrial enlargement on his echocardiogram. Apparently Corey Corey [name] had discussed the possibility of a cardioversion with him but had recommended at least 1 month of anticoagulation. Today is his 28th day of Eliquis. He has not missed any doses. EKG shows persistent atrial fibrillation.  I saw Corey Corey [name] back today in the office for follow-up. He underwent successful cardioversion to sinus rhythm and maintains that today. He notes that he's had some improvement in his energy and was able to mow his lawn without having to stop. He started walking again and is doing about a mile a day. Generally seems to be thriving. Heart rate is  noted to be low in the 50s but he noted that it was in the 50s for most of his life. He denies any chest pain, shortness of breath and as mentioned his energy level has improved. He seems to be tolerating Eliquis without any bleeding complications.  02/16/2016  Corey Corey [name] is doing well without any complaints today. He is maintaining a sinus bradycardia with heart rate of 47 and is asymptomatic. He is not on any AV nodal blocking medications. He is on Eliquis 5 mg twice a day and has no complaints. He denies any bleeding. He is on Lipitor for dyslipidemia but has not had a recent lipid profile.  He denies chest pain or shortness of breath with exertion.  07/17/2016  Corey Corey [name] returns today for follow-up. He is currently in sinus bradycardia with first-degree AV block at 53. He remains on Eliquis for anticoagulation. He was previously on flecainide 50 mg twice daily, but given recent palpitations his medication was increased to 75 mg twice daily and then 100 mg twice daily in the A. fib clinic. He reports good control of her palpitations now although heart rate is low in the 50s. He says with exercise he gets short of breath and has some fatigue which could indicate some chronotropic incompetence.  08/03/2016  Corey Corey [name] returns today for follow-up of his stress test. He was able to exercise for 7 minutes and achieved a max predicted heart rate of 92%. He achieved a heart rate of 85% max predicted after 4.5 minutes and had recovery after 7 minutes. There is no evidence of chronotropic incompetence. The study was negative for ischemia. He reports the shortness of breath has improved significantly. He's been able to do some heavy yard work since then without any difficulty. I suspect his symptoms actually were related to probably atrial mechanical inactivity with his recent A. fib which was slow to recover despite the fact that he is flecainide has restored sinus rhythm.  01/30/2017  Corey Corey [name] returns today for follow-up. He continues to have bradycardia. He had exercise stress testing which showed improvement in heart rate and no chronotropic incompetence. He is walking now up to about 2 miles. He says his heart rate can get up into the 80s. His shortness of breath is still present with marked exertion but goes away fairly quickly. He is maintaining a sinus bradycardia. He denies any bleeding problems on the Eliquis. He said no labs that we can detect this year. His last lipid profile was September 2017 which showed an LDL 69.  Past Medical History:  Diagnosis Date  . A-fib (HCC)     . Basal cell carcinoma of auricle of right ear   . BPH (benign prostatic hyperplasia)   . DJD (degenerative joint disease)    left  . ED (erectile dysfunction)   . Fatigue   . Hearing loss, sensorineural   . Hx of adenomatous colonic polyps   . Hx of major depression   . Hypertension   . Iron deficiency anemia   . Irregular heartbeat   . Nephrolithiasis   . Seasonal allergic rhinitis   . SOB (shortness of breath)     Past Surgical History:  Procedure Laterality Date  . CARDIOVERSION N/A 08/13/2015   Procedure: CARDIOVERSION;  Surgeon: Chrystie Nose, MD;  Location: [facility] ENDOSCOPY;  Service: Cardiovascular;  Laterality: N/A;  . CARDIOVERSION N/A 04/04/2016   Procedure: CARDIOVERSION;  Surgeon: Quintella Reichert, MD;  Location: [facility] ENDOSCOPY;  Service: Cardiovascular;  Laterality:  N/A;  . CHOLECYSTECTOMY    . colonscopy    . herniorraphy Bilateral    had surgery x2 on the left  and right  . TONSILLECTOMY AND ADENOIDECTOMY       Current Outpatient Prescriptions  Medication Sig Dispense Refill  . amLODipine-benazepril (LOTREL) 5-20 MG capsule Take 1 capsule by mouth daily.    [name] apixaban (ELIQUIS) 5 MG TABS tablet Take 5 mg by mouth 2 (two) times daily.    [name] atorvastatin (LIPITOR) 20 MG tablet Take 20 mg by mouth daily.    . cholecalciferol (VITAMIN D) 1000 units tablet Take 1,000 Units by mouth daily.    . flecainide (TAMBOCOR) 100 MG tablet TAKE ONE TABLET BY MOUTH TWICE DAILY 60 tablet 5  . Multiple Vitamin (MULTIVITAMIN) tablet Take 1 tablet by mouth daily. Take once a day    . polycarbophil (FIBERCON) 625 MG tablet Take 1,250 mg by mouth daily.     No current facility-administered medications for this visit.     Allergies:   Patient has no known allergies.    Social History:  The patient  reports that he quit smoking about 56 years ago. His smoking use included Cigarettes. He has never used smokeless tobacco. He reports that he does not drink alcohol.   Family History:   The patient's family history includes Alcoholism in his daughter; Arrhythmia in his daughter; CAD in his mother; Cancer - Prostate in his brother; Healthy in his daughter and son; Heart attack in his father; Heart disease in his mother. history father died of a heart attack at age 3.  Mother died of postoperative hemorrhage after heart surgery at age 88   ROS:  Pertinent items noted in HPI and remainder of comprehensive ROS otherwise negative.  PHYSICAL EXAM: VS:  BP 140/64   Pulse (!) 43   Ht 6' (1.829 m)   Wt 198 lb 3.2 oz (89.9 kg)   BMI 26.88 kg/m  , BMI Body mass index is 26.88 kg/m. General appearance: alert and no distress Neck: no carotid bruit, no JVD and thyroid not enlarged, symmetric, no tenderness/mass/nodules Lungs: clear to auscultation bilaterally Heart: regular bradycardia Abdomen: soft, non-tender; bowel sounds normal; no masses,  no organomegaly Extremities: extremities normal, atraumatic, no cyanosis or edema Pulses: 2+ and symmetric Skin: Skin color, texture, turgor normal. No rashes or lesions Neurologic: Grossly normal Psych: pleasant  EKG:  Marked sinus bradycardia at 43, nonspecific IVCD-personally reviewed  Recent Labs: 02/16/2016: ALT 16 03/21/2016: BUN 17; Creat 0.99; Hemoglobin 13.4; Platelets 335; Potassium 5.0; Sodium 139    Lipid Panel    Component Value Date/Time   CHOL 163 02/16/2016 0826   TRIG 86 02/16/2016 0826   HDL 77 02/16/2016 0826   CHOLHDL 2.1 02/16/2016 0826   VLDL 17 02/16/2016 0826   LDLCALC 69 02/16/2016 0826      Wt Readings from Last 3 Encounters:  01/30/17 198 lb 3.2 oz (89.9 kg)  08/03/16 213 lb (96.6 kg)  07/17/16 213 lb (96.6 kg)    ASSESSMENT AND PLAN:  1.  Atrial fibrillation - CHADSVASC score of 3 on Eliquis 2.  Essential hypertension 3.  History of hypercholesterolemia 4.  DOE - low risk GXT without chronotropic incompetence  Orders Placed This Encounter  Procedures  . CBC  . Basic metabolic panel  .  Lipid panel  . EKG 12-Lead   PLAN:    Mr. Revel is asymptomatic. He gets short of breath but only with marked exertion such as walking  up hills that improves very quickly. His heart rate does increase with exercise and he has affected monitoring that. He underwent a GXT and had no evidence of chronotropic incompetence. We'll continue his current dose of flecainide. He's had no bleeding problems on Eliquis. He's not had any lab work over the past year and is supportive for him to have a CBC and a metabolic profile to look for electrolyte abnormalities and any signs of anemia. We'll also check a fasting lipid profile as his last study was one year ago.  Follow-up with me annually or sooner as necessary.  Chrystie Nose, MD, [facility] Attending Cardiologist [facility]  01/30/2017 10:34 AM

## 2017-01-31 LAB — BASIC METABOLIC PANEL
BUN / CREAT RATIO: 22 (ref 10–24)
BUN: 19 mg/dL (ref 8–27)
CHLORIDE: 101 mmol/L (ref 96–106)
CO2: 25 mmol/L (ref 20–29)
CREATININE: 0.88 mg/dL (ref 0.76–1.27)
Calcium: 9.3 mg/dL (ref 8.6–10.2)
GFR calc Af Amer: 94 mL/min/{1.73_m2} (ref >59)
GFR calc non Af Amer: 81 mL/min/{1.73_m2} (ref >59)
GLUCOSE: 90 mg/dL (ref 65–99)
Potassium: 4.9 mmol/L (ref 3.5–5.2)
SODIUM: 140 mmol/L (ref 134–144)

## 2017-01-31 LAB — LIPID PANEL
CHOLESTEROL TOTAL: 148 mg/dL (ref 100–199)
Chol/HDL Ratio: 2.1 ratio (ref 0.0–5.0)
HDL: 69 mg/dL (ref >39)
LDL CALC: 65 mg/dL (ref 0–99)
TRIGLYCERIDES: 70 mg/dL (ref 0–149)
VLDL Cholesterol Cal: 14 mg/dL (ref 5–40)

## 2017-01-31 LAB — CBC
HEMOGLOBIN: 12.7 g/dL — AB (ref 13.0–17.7)
Hematocrit: 38.4 % (ref 37.5–51.0)
MCH: 28.2 pg (ref 26.6–33.0)
MCHC: 33.1 g/dL (ref 31.5–35.7)
MCV: 85 fL (ref 79–97)
PLATELETS: 324 10*3/uL (ref 150–379)
RBC: 4.5 x10E6/uL (ref 4.14–5.80)
RDW: 15.2 % (ref 12.3–15.4)
WBC: 7.2 10*3/uL (ref 3.4–10.8)

## 2017-04-02 ENCOUNTER — Other Ambulatory Visit (HOSPITAL_COMMUNITY): Payer: Self-pay | Admitting: Internal Medicine

## 2017-10-01 ENCOUNTER — Other Ambulatory Visit (HOSPITAL_COMMUNITY): Payer: Self-pay | Admitting: Internal Medicine

## 2017-10-01 NOTE — Telephone Encounter (Signed)
REFILL 

## 2017-11-01 ENCOUNTER — Telehealth: Payer: Self-pay | Admitting: Internal Medicine

## 2017-11-01 ENCOUNTER — Ambulatory Visit (INDEPENDENT_AMBULATORY_CARE_PROVIDER_SITE_OTHER): Payer: Medicare Other

## 2017-11-01 VITALS — BP 113/70 | HR 93

## 2017-11-01 DIAGNOSIS — I480 Paroxysmal atrial fibrillation: Secondary | ICD-10-CM

## 2017-11-01 NOTE — Telephone Encounter (Signed)
Scheduled patient for EKG today at 1:20, patient aware

## 2017-11-01 NOTE — Progress Notes (Signed)
1.) Reason for visit: EKG  2.) Name of MD requesting visit: Dr. Debara Pickett    Patient presents for EKG as recommended by Dr. Debara Pickett (see telephone note).   Patient reports he was outside doing yard work and started feeling funny.  He noticed his HR increased on his fitbit so he went inside to check BP and HR.   HR 90s.    He states he notices some SOB and fatigue.     Chart review: hx of Afib, multiple cardioversion, on Eliquis and Flecainide  EKG completed and reviewed by Dr. Ellyn Hack (DOD)-Aflutter with variable block.  HR 72 BP 113/70.  Advised to follow up with Dr. Debara Pickett.   Attempt to schedule follow up-no availability until July with Dr. Debara Pickett.  Patient has been seen in Afib clinic in past as well.   Advised I would sent to Dr. Debara Pickett to review and will call to schedule follow up.   Patient aware to continue to monitor and call with any concerns or changes.

## 2017-11-01 NOTE — Telephone Encounter (Signed)
He can come in and get an EKG - we'll go from there.  Dr. Lemmie Evens

## 2017-11-01 NOTE — Telephone Encounter (Signed)
New Message   Patient c/o Palpitations:  High priority if patient c/o lightheadedness, shortness of breath, or chest pain  1) How long have you had palpitations/irregular HR/ Afib? Are you having the symptoms now? Afib since yesterday, is currently in afib now  2) Are you currently experiencing lightheadedness, SOB or CP? lightheadness and shortness of breath  3) Do you have a history of afib (atrial fibrillation) or irregular heart rhythm? yes  4) Have you checked your BP or HR? (document readings if available): HR is 82 earlier this morning it was 90. BP 114/76   5) Are you experiencing any other symptoms? No

## 2017-11-01 NOTE — Telephone Encounter (Signed)
Spoke with patient and he thinks he is back in Afib. Yesterday and he was doing some work outside and started feeling different.  He went inside to check his blood pressure and HR was 90. HR remained in the 80's-90's all day. This am blood pressure 114/76 HR. His HR since cardioversion has been running in the 50's. Per patient the rhythm on machine looked irregular. Will forward to Dr Debara Pickett for review

## 2017-11-02 NOTE — Telephone Encounter (Signed)
Patient has OV on 6/11 with Roderic Palau, NP

## 2017-11-06 ENCOUNTER — Encounter (HOSPITAL_COMMUNITY): Payer: Self-pay | Admitting: Nurse Practitioner

## 2017-11-06 ENCOUNTER — Ambulatory Visit (HOSPITAL_COMMUNITY)
Admission: RE | Admit: 2017-11-06 | Discharge: 2017-11-06 | Disposition: A | Discharge status: Home / Self Care | Payer: Medicare Other | Source: Ambulatory Visit | Attending: Nurse Practitioner | Admitting: Nurse Practitioner

## 2017-11-06 VITALS — BP 140/82 | HR 87 | Ht 72.0 in | Wt 199.0 lb

## 2017-11-06 DIAGNOSIS — F329 Major depressive disorder, single episode, unspecified: Secondary | ICD-10-CM | POA: Insufficient documentation

## 2017-11-06 DIAGNOSIS — N529 Male erectile dysfunction, unspecified: Secondary | ICD-10-CM | POA: Insufficient documentation

## 2017-11-06 DIAGNOSIS — Z87891 Personal history of nicotine dependence: Secondary | ICD-10-CM | POA: Diagnosis not present

## 2017-11-06 DIAGNOSIS — I10 Essential (primary) hypertension: Secondary | ICD-10-CM | POA: Insufficient documentation

## 2017-11-06 DIAGNOSIS — Z8249 Family history of ischemic heart disease and other diseases of the circulatory system: Secondary | ICD-10-CM | POA: Insufficient documentation

## 2017-11-06 DIAGNOSIS — Z85828 Personal history of other malignant neoplasm of skin: Secondary | ICD-10-CM | POA: Insufficient documentation

## 2017-11-06 DIAGNOSIS — N400 Enlarged prostate without lower urinary tract symptoms: Secondary | ICD-10-CM | POA: Diagnosis not present

## 2017-11-06 DIAGNOSIS — R001 Bradycardia, unspecified: Secondary | ICD-10-CM | POA: Diagnosis not present

## 2017-11-06 DIAGNOSIS — I484 Atypical atrial flutter: Secondary | ICD-10-CM | POA: Diagnosis not present

## 2017-11-06 DIAGNOSIS — I480 Paroxysmal atrial fibrillation: Secondary | ICD-10-CM | POA: Insufficient documentation

## 2017-11-06 DIAGNOSIS — Z7901 Long term (current) use of anticoagulants: Secondary | ICD-10-CM | POA: Diagnosis not present

## 2017-11-06 DIAGNOSIS — M1990 Unspecified osteoarthritis, unspecified site: Secondary | ICD-10-CM | POA: Insufficient documentation

## 2017-11-06 DIAGNOSIS — H905 Unspecified sensorineural hearing loss: Secondary | ICD-10-CM | POA: Diagnosis not present

## 2017-11-06 DIAGNOSIS — Z79899 Other long term (current) drug therapy: Secondary | ICD-10-CM | POA: Diagnosis not present

## 2017-11-06 HISTORY — DX: Paroxysmal atrial fibrillation: I48.0

## 2017-11-06 LAB — BASIC METABOLIC PANEL
Anion gap: 7 (ref 5–15)
BUN: 12 mg/dL (ref 6–20)
CALCIUM: 9.1 mg/dL (ref 8.9–10.3)
CO2: 29 mmol/L (ref 22–32)
Chloride: 104 mmol/L (ref 101–111)
Creatinine, Ser: 0.81 mg/dL (ref 0.61–1.24)
GFR calc Af Amer: >60 mL/min (ref >60)
GFR calc non Af Amer: >60 mL/min (ref >60)
GLUCOSE: 99 mg/dL (ref 65–99)
Potassium: 3.8 mmol/L (ref 3.5–5.1)
Sodium: 140 mmol/L (ref 135–145)

## 2017-11-06 LAB — CBC
HCT: 40.4 % (ref 39.0–52.0)
Hemoglobin: 12.2 g/dL — ABNORMAL LOW (ref 13.0–17.0)
MCH: 25.8 pg — AB (ref 26.0–34.0)
MCHC: 30.2 g/dL (ref 30.0–36.0)
MCV: 85.4 fL (ref 78.0–100.0)
PLATELETS: 346 10*3/uL (ref 150–400)
RBC: 4.73 MIL/uL (ref 4.22–5.81)
RDW: 15.9 % — AB (ref 11.5–15.5)
WBC: 7.9 10*3/uL (ref 4.0–10.5)

## 2017-11-06 NOTE — Patient Instructions (Signed)
Cardioversion scheduled for Monday, June 17th  - Arrive at the [facility] and go to admitting at 9:30AM  -Do not eat or drink anything after midnight the night prior to your procedure.  - Take all your medication with a sip of water prior to arrival.  - You will not be able to drive home after your procedure.

## 2017-11-06 NOTE — Progress Notes (Signed)
Primary Care Physician: Lavone Orn, MD Primary Cardiologist: Genella Rife ALTUS Corey [name] is a 83 y.o. male with a history of persistent atrial fibrillation who presents for follow up in the [facility].  He was seen by Dr Debara Pickett in September of 2018 and doing well maintaining SR. He does have baseline sinus bradycardia but walked on a treadmill and was not chronotropically incompetent by GXT eval.  Since that visit, he did well with stable shortness of breath on exertion.  On Saturday, he felt like he went back out of rhythm and has had symptoms of increased weakness and fatigue. He has been compliant with Flecainide and Eliquis with no missed doses.   Today, he  denies symptoms of palpitations, chest pain, shortness of breath, orthopnea, PND, lower extremity edema, dizziness, presyncope, syncope, snoring, daytime somnolence, bleeding, or neurologic sequela. The patient is tolerating medications without difficulties and is otherwise without complaint today.    Atrial Fibrillation Risk Factors:  he does not have symptoms or diagnosis of sleep apnea.  he does not have a history of rheumatic fever.  he does not have a history of alcohol use.  he has a BMI of Body mass index is 26.99 kg/m.[name] Filed Weights   11/06/17 0924  Weight: 199 lb (90.3 kg)    LA size: 41   Atrial Fibrillation Management history:  Previous antiarrhythmic drugs: Flecainide  Previous cardioversions: 07/2015, 03/2016  Previous ablations: none  CHADS2VASC score: 2  Anticoagulation history: Eliquis   Past Medical History:  Diagnosis Date  . Basal cell carcinoma of auricle of right ear   . BPH (benign prostatic hyperplasia)   . DJD (degenerative joint disease)    left  . ED (erectile dysfunction)   . Fatigue   . Hearing loss, sensorineural   . Hx of adenomatous colonic polyps   . Hx of major depression   . Hypertension   . Iron deficiency anemia   . Nephrolithiasis   .  Paroxysmal atrial fibrillation (HCC)   . Seasonal allergic rhinitis    Past Surgical History:  Procedure Laterality Date  . CARDIOVERSION N/A 08/13/2015   Procedure: CARDIOVERSION;  Surgeon: Pixie Casino, MD;  Location: [facility] ENDOSCOPY;  Service: Cardiovascular;  Laterality: N/A;  . CARDIOVERSION N/A 04/04/2016   Procedure: CARDIOVERSION;  Surgeon: Sueanne Margarita, MD;  Location: [facility] ENDOSCOPY;  Service: Cardiovascular;  Laterality: N/A;  . CHOLECYSTECTOMY    . colonscopy    . herniorraphy Bilateral    had surgery x2 on the left  and right  . TONSILLECTOMY AND ADENOIDECTOMY      Current Outpatient Medications  Medication Sig Dispense Refill  . amLODipine-benazepril (LOTREL) 5-20 MG capsule Take 1 capsule by mouth daily.    [name] apixaban (ELIQUIS) 5 MG TABS tablet Take 5 mg by mouth 2 (two) times daily.    [name] atorvastatin (LIPITOR) 20 MG tablet Take 20 mg by mouth daily.    . cholecalciferol (VITAMIN D) 1000 units tablet Take 1,000 Units by mouth daily.    . flecainide (TAMBOCOR) 100 MG tablet TAKE 1 TABLET BY MOUTH TWICE DAILY 180 tablet 0  . polycarbophil (FIBERCON) 625 MG tablet Take 1,250 mg by mouth daily.     No current facility-administered medications for this encounter.     No Known Allergies  Social History   Socioeconomic History  . Marital status: Married    Spouse name: Not on file  . Number of children: Not on file  .  Years of education: Not on file  . Highest education level: Not on file  Occupational History  . Not on file  Social Needs  . Financial resource strain: Not on file  . Food insecurity:    Worry: Not on file    Inability: Not on file  . Transportation needs:    Medical: Not on file    Non-medical: Not on file  Tobacco Use  . Smoking status: Former Smoker    Types: Cigarettes    Last attempt to quit: 07/15/1960    Years since quitting: 57.3  . Smokeless tobacco: Never Used  Substance and Sexual Activity  . Alcohol use: No  . Drug use: Not on  file  . Sexual activity: Yes  Lifestyle  . Physical activity:    Days per week: Not on file    Minutes per session: Not on file  . Stress: Not on file  Relationships  . Social connections:    Talks on phone: Not on file    Gets together: Not on file    Attends religious service: Not on file    Active member of club or organization: Not on file    Attends meetings of clubs or organizations: Not on file    Relationship status: Not on file  . Intimate partner violence:    Fear of current or ex partner: Not on file    Emotionally abused: Not on file    Physically abused: Not on file    Forced sexual activity: Not on file  Other Topics Concern  . Not on file  Social History Narrative  . Not on file    Family History  Problem Relation Age of Onset  . CAD Mother   . Heart disease Mother   . Heart attack Father   . Cancer - Prostate Brother   . Alcoholism Daughter   . Arrhythmia Daughter   . Healthy Son   . Healthy Daughter     ROS- All systems are reviewed and negative except as per the HPI above.  Physical Exam: Vitals:   11/06/17 0924  BP: 140/82  Pulse: 87  Weight: 199 lb (90.3 kg)  Height: 6' (1.829 m)    GEN- The patient is elderly appearing, alert and oriented x 3 today.   Head- normocephalic, atraumatic Eyes-  Sclera clear, conjunctiva pink Ears- hearing intact Oropharynx- clear Neck- supple  Lungs- Clear to ausculation bilaterally, normal work of breathing Heart- Irregular rate and rhythm  GI- soft, NT, ND, + BS Extremities- no clubbing, cyanosis, or edema MS- no significant deformity or atrophy Skin- no rash or lesion Psych- euthymic mood, full affect Neuro- strength and sensation are intact  Wt Readings from Last 3 Encounters:  11/06/17 199 lb (90.3 kg)  01/30/17 198 lb 3.2 oz (89.9 kg)  08/03/16 213 lb (96.6 kg)    EKG today demonstrates atypical atrial flutter, V rate 87  Epic records are reviewed at length today  Assessment and  Plan:  1. Paroxysmal atrial fibrillation/atypical atrial flutter Persistent since Saturday He has been compliant with Flecainide and Eliquis With baseline sinus bradycardia, I am hesitant to add AVN blocking agents  Discussed risks, benefits of DCCV with patient who wishes to proceed. Will plan at the next available time.  Continue Eliquis for CHADS2VASC of 2  2. Sinus bradycardia He has symptoms of exercise intolerance and fatigue with bradycardia  Discussed potential pacemaker implant today. He is clear that he would like to avoid if possible He  will track symptoms  Follow up with AF clinic 2 weeks after [place], NP 11/06/2017 10:40 AM

## 2017-11-12 ENCOUNTER — Other Ambulatory Visit: Payer: Self-pay

## 2017-11-12 ENCOUNTER — Ambulatory Visit (HOSPITAL_COMMUNITY): Payer: Medicare Other | Admitting: Certified Registered Nurse Anesthetist

## 2017-11-12 ENCOUNTER — Ambulatory Visit (HOSPITAL_COMMUNITY)
Admission: RE | Admit: 2017-11-12 | Discharge: 2017-11-12 | Disposition: A | Discharge status: Home / Self Care | Payer: Medicare Other | Source: Ambulatory Visit | Attending: Internal Medicine | Admitting: Internal Medicine

## 2017-11-12 ENCOUNTER — Encounter (HOSPITAL_COMMUNITY): Payer: Self-pay | Admitting: *Deleted

## 2017-11-12 ENCOUNTER — Encounter (HOSPITAL_COMMUNITY)
Admission: RE | Disposition: A | Discharge status: Home / Self Care | Payer: Self-pay | Source: Ambulatory Visit | Attending: Internal Medicine

## 2017-11-12 DIAGNOSIS — I4892 Unspecified atrial flutter: Secondary | ICD-10-CM

## 2017-11-12 DIAGNOSIS — I10 Essential (primary) hypertension: Secondary | ICD-10-CM | POA: Diagnosis not present

## 2017-11-12 DIAGNOSIS — Z87891 Personal history of nicotine dependence: Secondary | ICD-10-CM | POA: Diagnosis not present

## 2017-11-12 HISTORY — PX: CARDIOVERSION: SHX1299

## 2017-11-12 SURGERY — CARDIOVERSION
Anesthesia: General

## 2017-11-12 MED ORDER — LIDOCAINE HCL (CARDIAC) PF 100 MG/5ML IV SOSY
PREFILLED_SYRINGE | INTRAVENOUS | Status: DC | PRN
Start: 2017-11-12 — End: 2017-11-12
  Administered 2017-11-12: 40 mg via INTRAVENOUS

## 2017-11-12 MED ORDER — SODIUM CHLORIDE 0.9 % IV SOLN
INTRAVENOUS | Status: DC
  Administered 2017-11-12: 10:00:00 via INTRAVENOUS

## 2017-11-12 MED ORDER — PROPOFOL 10 MG/ML IV BOLUS
INTRAVENOUS | Status: DC | PRN
  Administered 2017-11-12: 60 mg via INTRAVENOUS

## 2017-11-12 NOTE — Transfer of Care (Signed)
Immediate Anesthesia Transfer of Care Note  Patient: Corey [name]  Procedure(s) Performed: CARDIOVERSION (N/A )  Patient Location: Endoscopy Unit  Anesthesia Type:General  Level of Consciousness: awake, alert , oriented, patient cooperative and responds to stimulation  Airway & Oxygen Therapy: Patient Spontanous Breathing and Patient connected to nasal cannula oxygen  Post-op Assessment: Report given to RN, Post -op Vital signs reviewed and stable and Patient moving all extremities X 4  Post vital signs: Reviewed and stable  Last Vitals:  Vitals Value Taken Time  BP    Temp    Pulse    Resp    SpO2      Last Pain:  Vitals:   11/12/17 0920  TempSrc: Oral  PainSc: 0-No pain         Complications: No apparent anesthesia complications

## 2017-11-12 NOTE — CV Procedure (Addendum)
   CARDIOVERSION NOTE  Procedure: Electrical Cardioversion Indications:  Atrial Flutter  Procedure Details:  Consent: Risks of procedure as well as the alternatives and risks of each were explained to the (patient/caregiver).  Consent for procedure obtained.  Time Out: Verified patient identification, verified procedure, site/side was marked, verified correct patient position, special equipment/implants available, medications/allergies/relevent history reviewed, required imaging and test results available.  Performed  Patient placed on cardiac monitor, pulse oximetry, supplemental oxygen as necessary.  Sedation given: Propofol per anesthesia Pacer pads placed anterior and posterior chest.  Cardioverted 1 time(s).  Cardioverted at 120J biphasic.  Impression: Findings: Post procedure EKG shows: Sinus bradycardia Complications: None Patient did tolerate procedure well.  Plan: 1. Successful DCCV to sinus bradycardia in the 50's with a single 120J biphasic shock.  Time Spent Directly with the Patient:  30 minutes   Pixie Casino, MD, [facility], [place] Director of the Advanced Lipid Disorders &  Cardiovascular Risk Reduction Clinic Diplomate of the American Board of Clinical Lipidology Attending Cardiologist  Direct Dial: [phone]  Fax: [phone]  Website:  [email or web address] 11/12/2017, 10:16 AM

## 2017-11-12 NOTE — Anesthesia Postprocedure Evaluation (Signed)
Anesthesia Post Note  Patient: Corey [name]  Procedure(s) Performed: CARDIOVERSION (N/A )     Patient location during evaluation: Endoscopy Anesthesia Type: General Level of consciousness: awake and alert Pain management: pain level controlled Vital Signs Assessment: post-procedure vital signs reviewed and stable Respiratory status: spontaneous breathing, nonlabored ventilation, respiratory function stable and patient connected to nasal cannula oxygen Cardiovascular status: blood pressure returned to baseline and stable Postop Assessment: no apparent nausea or vomiting Anesthetic complications: no    Last Vitals:  Vitals:   11/12/17 1015 11/12/17 1030  BP: 126/63 136/68  Pulse:    Resp: 16 16  Temp: 36.6 C   SpO2: 100% 100%    Last Pain:  Vitals:   11/12/17 1030  TempSrc:   PainSc: 0-No pain                 Tahj Njoku COKER

## 2017-11-12 NOTE — H&P (Signed)
   INTERVAL PROCEDURE H&P  History and Physical Interval Note:  11/12/2017 9:54 AM  Corey [name] has presented today for their planned procedure. The various methods of treatment have been discussed with the patient and family. After consideration of risks, benefits and other options for treatment, the patient has consented to the procedure.  The patients' outpatient history has been reviewed, patient examined, and no change in status from most recent office note within the past 30 days. I have reviewed the patients' chart and labs and will proceed as planned. Questions were answered to the patient's satisfaction.   Pixie Casino, MD, [facility], [place] Director of the Advanced Lipid Disorders &  Cardiovascular Risk Reduction Clinic Diplomate of the American Board of Clinical Lipidology Attending Cardiologist  Direct Dial: [phone]  Fax: [phone]  Website:  [email or web address] Shands 11/12/2017, 9:54 AM

## 2017-11-12 NOTE — Discharge Instructions (Signed)
Electrical Cardioversion °Electrical cardioversion is the delivery of a jolt of electricity to restore a normal rhythm to the heart. A rhythm that is too fast or is not regular keeps the heart from pumping well. In this procedure, sticky patches or metal paddles are placed on the chest to deliver electricity to the heart from a device. °This procedure may be done in an emergency if: °· There is low or no blood pressure as a result of the heart rhythm. °· Normal rhythm must be restored as fast as possible to protect the brain and heart from further damage. °· It may save a life. ° °This procedure may also be done for irregular or fast heart rhythms that are not immediately life-threatening. °Tell a health care provider about: °· Any allergies you have. °· All medicines you are taking, including vitamins, herbs, eye drops, creams, and over-the-counter medicines. °· Any problems you or family members have had with anesthetic medicines. °· Any blood disorders you have. °· Any surgeries you have had. °· Any medical conditions you have. °· Whether you are pregnant or may be pregnant. °What are the risks? °Generally, this is a safe procedure. However, problems may occur, including: °· Allergic reactions to medicines. °· A blood clot that breaks free and travels to other parts of your body. °· The possible return of an abnormal heart rhythm within hours or days after the procedure. °· Your heart stopping (cardiac arrest ). This is rare. ° °What happens before the procedure? °Medicines °· Your health care provider may have you start taking: °? Blood-thinning medicines (anticoagulants) so your blood does not clot as easily. °? Medicines may be given to help stabilize your heart rate and rhythm. °· Ask your health care provider about changing or stopping your regular medicines. This is especially important if you are taking diabetes medicines or blood thinners. °General instructions °· Plan to have someone take you home from  the hospital or clinic. °· If you will be going home right after the procedure, plan to have someone with you for 24 hours. °· Follow instructions from your health care provider about eating or drinking restrictions. °What happens during the procedure? °· To lower your risk of infection: °? Your health care team will wash or sanitize their hands. °? Your skin will be washed with soap. °· An IV tube will be inserted into one of your veins. °· You will be given a medicine to help you relax (sedative). °· Sticky patches (electrodes) or metal paddles may be placed on your chest. °· An electrical shock will be delivered. °The procedure may vary among health care providers and hospitals. °What happens after the procedure? °· Your blood pressure, heart rate, breathing rate, and blood oxygen level will be monitored until the medicines you were given have worn off. °· Do not drive for 24 hours if you were given a sedative. °· Your heart rhythm will be watched to make sure it does not change. °This information is not intended to replace advice given to you by your health care provider. Make sure you discuss any questions you have with your health care provider. °Document Released: 05/05/2002 Document Revised: 01/12/2016 Document Reviewed: 11/19/2015 °Elsevier Interactive Patient Education © 2017 Elsevier Inc. ° °

## 2017-11-12 NOTE — Anesthesia Postprocedure Evaluation (Signed)
Anesthesia Post Note  Patient: Corey [name]  Procedure(s) Performed: CARDIOVERSION (N/A )     Patient location during evaluation: Endoscopy Anesthesia Type: General Level of consciousness: awake and alert Pain management: pain level controlled Vital Signs Assessment: post-procedure vital signs reviewed and stable Respiratory status: spontaneous breathing, nonlabored ventilation, respiratory function stable and patient connected to nasal cannula oxygen Cardiovascular status: blood pressure returned to baseline and stable Postop Assessment: no apparent nausea or vomiting Anesthetic complications: no    Last Vitals:  Vitals:   11/12/17 1015 11/12/17 1030  BP: 126/63 136/68  Pulse:    Resp: 16 16  Temp: 36.6 C   SpO2: 100% 100%    Last Pain:  Vitals:   11/12/17 1030  TempSrc:   PainSc: 0-No pain                 [name]

## 2017-11-12 NOTE — Anesthesia Preprocedure Evaluation (Addendum)
Anesthesia Evaluation  Patient identified by MRN, date of birth, ID band Patient awake    Reviewed: Allergy & Precautions, NPO status , Patient's Chart, lab work & pertinent test results  Airway Mallampati: II  TM Distance: >3 FB Neck ROM: Full    Dental  (+) Teeth Intact   Pulmonary former smoker,    breath sounds clear to auscultation       Cardiovascular hypertension,  Rhythm:Irregular Rate:Tachycardia     Neuro/Psych    GI/Hepatic   Endo/Other    Renal/GU      Musculoskeletal   Abdominal   Peds  Hematology   Anesthesia Other Findings   Reproductive/Obstetrics                           Anesthesia Physical Anesthesia Plan  ASA: III  Anesthesia Plan: General   Post-op Pain Management:    Induction: Intravenous  PONV Risk Score and Plan: Propofol infusion  Airway Management Planned: Mask  Additional Equipment:   Intra-op Plan:   Post-operative Plan:   Informed Consent: I have reviewed the patients History and Physical, chart, labs and discussed the procedure including the risks, benefits and alternatives for the proposed anesthesia with the patient or authorized representative who has indicated his/her understanding and acceptance.   Dental advisory given  Plan Discussed with: CRNA and Anesthesiologist  Anesthesia Plan Comments:         Anesthesia Quick Evaluation

## 2017-11-14 ENCOUNTER — Encounter (HOSPITAL_COMMUNITY): Payer: Self-pay | Admitting: Internal Medicine

## 2017-11-20 ENCOUNTER — Encounter (HOSPITAL_COMMUNITY): Payer: Self-pay | Admitting: Nurse Practitioner

## 2017-11-20 ENCOUNTER — Ambulatory Visit (HOSPITAL_COMMUNITY)
Admission: RE | Admit: 2017-11-20 | Discharge: 2017-11-20 | Disposition: A | Discharge status: Home / Self Care | Payer: Medicare Other | Source: Ambulatory Visit | Attending: Nurse Practitioner | Admitting: Nurse Practitioner

## 2017-11-20 VITALS — BP 138/62 | HR 47 | Ht 72.0 in | Wt 197.0 lb

## 2017-11-20 DIAGNOSIS — Z8249 Family history of ischemic heart disease and other diseases of the circulatory system: Secondary | ICD-10-CM | POA: Diagnosis not present

## 2017-11-20 DIAGNOSIS — I10 Essential (primary) hypertension: Secondary | ICD-10-CM | POA: Diagnosis not present

## 2017-11-20 DIAGNOSIS — Z7901 Long term (current) use of anticoagulants: Secondary | ICD-10-CM | POA: Diagnosis not present

## 2017-11-20 DIAGNOSIS — M1990 Unspecified osteoarthritis, unspecified site: Secondary | ICD-10-CM | POA: Insufficient documentation

## 2017-11-20 DIAGNOSIS — F329 Major depressive disorder, single episode, unspecified: Secondary | ICD-10-CM | POA: Insufficient documentation

## 2017-11-20 DIAGNOSIS — J302 Other seasonal allergic rhinitis: Secondary | ICD-10-CM | POA: Insufficient documentation

## 2017-11-20 DIAGNOSIS — H905 Unspecified sensorineural hearing loss: Secondary | ICD-10-CM | POA: Insufficient documentation

## 2017-11-20 DIAGNOSIS — Z79899 Other long term (current) drug therapy: Secondary | ICD-10-CM | POA: Insufficient documentation

## 2017-11-20 DIAGNOSIS — I480 Paroxysmal atrial fibrillation: Secondary | ICD-10-CM | POA: Insufficient documentation

## 2017-11-20 DIAGNOSIS — Z85828 Personal history of other malignant neoplasm of skin: Secondary | ICD-10-CM | POA: Diagnosis not present

## 2017-11-20 DIAGNOSIS — N400 Enlarged prostate without lower urinary tract symptoms: Secondary | ICD-10-CM | POA: Insufficient documentation

## 2017-11-20 DIAGNOSIS — N529 Male erectile dysfunction, unspecified: Secondary | ICD-10-CM | POA: Diagnosis not present

## 2017-11-20 DIAGNOSIS — Z87891 Personal history of nicotine dependence: Secondary | ICD-10-CM | POA: Insufficient documentation

## 2017-11-20 DIAGNOSIS — R001 Bradycardia, unspecified: Secondary | ICD-10-CM | POA: Diagnosis not present

## 2017-11-20 NOTE — Progress Notes (Signed)
Primary Care Physician: Lavone Orn, MD Referring Physician: Jhonatan Lomeli is a 83 y.o. male with a h/o paroxysmal afib that recently had successful cardioversion 6/17. He is on flecainide which usually holds him in SR . No trigger identified. He has asymptomatic brady at baseline. He feels well today.  Today, he denies symptoms of palpitations, chest pain, shortness of breath, orthopnea, PND, lower extremity edema, dizziness, presyncope, syncope, or neurologic sequela. The patient is tolerating medications without difficulties and is otherwise without complaint today.   Past Medical History:  Diagnosis Date  . Basal cell carcinoma of auricle of right ear   . BPH (benign prostatic hyperplasia)   . DJD (degenerative joint disease)    left  . ED (erectile dysfunction)   . Fatigue   . Hearing loss, sensorineural   . Hx of adenomatous colonic polyps   . Hx of major depression   . Hypertension   . Iron deficiency anemia   . Nephrolithiasis   . Paroxysmal atrial fibrillation (HCC)   . Seasonal allergic rhinitis    Past Surgical History:  Procedure Laterality Date  . CARDIOVERSION N/A 08/13/2015   Procedure: CARDIOVERSION;  Surgeon: Pixie Casino, MD;  Location: [facility] ENDOSCOPY;  Service: Cardiovascular;  Laterality: N/A;  . CARDIOVERSION N/A 04/04/2016   Procedure: CARDIOVERSION;  Surgeon: Sueanne Margarita, MD;  Location: [facility] ENDOSCOPY;  Service: Cardiovascular;  Laterality: N/A;  . CARDIOVERSION N/A 11/12/2017   Procedure: CARDIOVERSION;  Surgeon: Pixie Casino, MD;  Location: [facility] ENDOSCOPY;  Service: Cardiovascular;  Laterality: N/A;  . CHOLECYSTECTOMY    . colonscopy    . herniorraphy Bilateral    had surgery x2 on the left  and right  . TONSILLECTOMY AND ADENOIDECTOMY      Current Outpatient Medications  Medication Sig Dispense Refill  . amLODipine-benazepril (LOTREL) 5-20 MG capsule Take 1 capsule by mouth daily.    [name] apixaban (ELIQUIS) 5 MG TABS tablet Take  5 mg by mouth 2 (two) times daily.    [name] atorvastatin (LIPITOR) 20 MG tablet Take 20 mg by mouth daily.    . cholecalciferol (VITAMIN D) 1000 units tablet Take 1,000 Units by mouth daily.    . flecainide (TAMBOCOR) 100 MG tablet TAKE 1 TABLET BY MOUTH TWICE DAILY 180 tablet 0  . polycarbophil (FIBERCON) 625 MG tablet Take 1,250 mg by mouth daily.      No current facility-administered medications for this encounter.     No Known Allergies  Social History   Socioeconomic History  . Marital status: Married    Spouse name: Not on file  . Number of children: Not on file  . Years of education: Not on file  . Highest education level: Not on file  Occupational History  . Not on file  Social Needs  . Financial resource strain: Not on file  . Food insecurity:    Worry: Not on file    Inability: Not on file  . Transportation needs:    Medical: Not on file    Non-medical: Not on file  Tobacco Use  . Smoking status: Former Smoker    Types: Cigarettes    Last attempt to quit: 07/15/1960    Years since quitting: 57.3  . Smokeless tobacco: Never Used  Substance and Sexual Activity  . Alcohol use: No  . Drug use: Not on file  . Sexual activity: Yes  Lifestyle  . Physical activity:    Days per week: Not on file  Minutes per session: Not on file  . Stress: Not on file  Relationships  . Social connections:    Talks on phone: Not on file    Gets together: Not on file    Attends religious service: Not on file    Active member of club or organization: Not on file    Attends meetings of clubs or organizations: Not on file    Relationship status: Not on file  . Intimate partner violence:    Fear of current or ex partner: Not on file    Emotionally abused: Not on file    Physically abused: Not on file    Forced sexual activity: Not on file  Other Topics Concern  . Not on file  Social History Narrative  . Not on file    Family History  Problem Relation Age of Onset  . CAD Mother    . Heart disease Mother   . Heart attack Father   . Cancer - Prostate Brother   . Alcoholism Daughter   . Arrhythmia Daughter   . Healthy Son   . Healthy Daughter     ROS- All systems are reviewed and negative except as per the HPI above  Physical Exam: Vitals:   11/20/17 0958  BP: 138/62  Pulse: (!) 47  Weight: 197 lb (89.4 kg)  Height: 6' (1.829 m)   Wt Readings from Last 3 Encounters:  11/20/17 197 lb (89.4 kg)  11/12/17 199 lb (90.3 kg)  11/06/17 199 lb (90.3 kg)    Labs: Lab Results  Component Value Date   NA 140 11/06/2017   K 3.8 11/06/2017   CL 104 11/06/2017   CO2 29 11/06/2017   GLUCOSE 99 11/06/2017   BUN 12 11/06/2017   CREATININE 0.81 11/06/2017   CALCIUM 9.1 11/06/2017   Lab Results  Component Value Date   INR 1.1 03/21/2016   Lab Results  Component Value Date   CHOL 148 01/31/2017   HDL 69 01/31/2017   LDLCALC 65 01/31/2017   TRIG 70 01/31/2017     GEN- The patient is well appearing, alert and oriented x 3 today.   Head- normocephalic, atraumatic Eyes-  Sclera clear, conjunctiva pink Ears- hearing intact Oropharynx- clear Neck- supple, no JVP Lymph- no cervical lymphadenopathy Lungs- Clear to ausculation bilaterally, normal work of breathing Heart- Regular rate and rhythm, no murmurs, rubs or gallops, PMI not laterally displaced GI- soft, NT, ND, + BS Extremities- no clubbing, cyanosis, or edema MS- no significant deformity or atrophy Skin- no rash or lesion Psych- euthymic mood, full affect Neuro- strength and sensation are intact  EKG-Sinus brady at 47 bpm, Pr int 168 ms, qrs int 126 ms, qtc 442 ms    Assessment and Plan: 1. Afib Successful cardioversion Continue flecainide at 100 mg bid  Continue Eliquis without any missed doses for a CHA2DS2VASc score of at least 3  2. Asymptomatic brady Previously  walked on a treadmill and was not chronotropically incompetent by GXT eval. No rate control on board 2/2 to this    F/u  with Dr. Debara Pickett 9/26 and afib clinc as needed  Butch Penny C. Mckenize Mezera, [facility] [address] [place], [place] [id number] [phone]

## 2017-12-24 ENCOUNTER — Other Ambulatory Visit (HOSPITAL_COMMUNITY): Payer: Self-pay | Admitting: Internal Medicine

## 2017-12-24 NOTE — Telephone Encounter (Signed)
Rx(s) sent to pharmacy electronically.  

## 2018-02-21 ENCOUNTER — Encounter: Payer: Self-pay | Admitting: Internal Medicine

## 2018-02-21 ENCOUNTER — Ambulatory Visit: Payer: Medicare Other | Admitting: Internal Medicine

## 2018-02-21 VITALS — BP 142/69 | HR 48 | Ht 72.0 in | Wt 196.4 lb

## 2018-02-21 DIAGNOSIS — R001 Bradycardia, unspecified: Secondary | ICD-10-CM | POA: Diagnosis not present

## 2018-02-21 DIAGNOSIS — I10 Essential (primary) hypertension: Secondary | ICD-10-CM

## 2018-02-21 DIAGNOSIS — I480 Paroxysmal atrial fibrillation: Secondary | ICD-10-CM

## 2018-02-21 DIAGNOSIS — Z79899 Other long term (current) drug therapy: Secondary | ICD-10-CM | POA: Diagnosis not present

## 2018-02-21 NOTE — Progress Notes (Signed)
Cardiology Office Note   Date:  02/21/2018   ID:  Kairo, Ritter 1934/07/22, MRN [id number]  PCP:  Kirby Funk, MD   CC: No complaints   History of Present Illness: Corey [name] is a 83 y.o. male who presents for cardiology evaluation.  He is seen by me for the first time today.  He is seen at the request of Dr. Kirby Funk.  The patient is being seen because of atrial fibrillation of unknown duration.  The patient was seen for a physical in November 2016 by Dr. Valentina Lucks.  No EKG was done at that time according to the patient, but no mention of atrial fibrillation or irregular pulse wasn't mentioned.  Subsequently had a nurse practitioner house call about a week ago who performed a physical examination on him at his home.  She noted an irregular pulse.  [name] was notified and he was seen in Dr. Jone Baseman office on 07/14/15 by Ida Rogue, NP, who obtained an electrocardiogram and confirmed the presence of atrial fibrillation. The patient himself is really not aware of his heart rate.  He does not know how long he may have been in the atrial fibrillation.  He began experiencing fatigue several years ago and because of that stop playing golf about 2 years ago.  He would give out after playing about 12 holes of golf.  He has not had any symptoms of congestive heart failure.  He sleeps on one pillow.  He does not have any difficulty climbing stairs.  He has had a sense of vague chest tightness at times.  There is no radiation to the arms.  The past 6 months the patient has also had occasional night sweats which are new. The patient has had some aching in the muscles of his legs.  He is on statin therapy. He has not had any TIA symptoms.  He has not had any dizziness or syncope.  Mr. Cottrill is seen in the office today is a new patient to me however he was recently seen by Dr. Patty Sermons. Unfortunately Dr. Patty Sermons just retired and as Mr. Sebestyen wife is my  patient, he is kindly requested my services. This or Minzey says over the past year he's had some worsening fatigue and decreased energy levels. In fact she's given up a lot of activities including golf which he enjoyed playing. He thought it was just related to being older. He also stopped doing a lot of lawn work and other activities. Recently had a home physical where he was found to be out of rhythm and then had an EKG which demonstrated atrial fibrillation. He was started on Eliquis and was seen by Dr. Patty Sermons. An echocardiogram and stress test were ordered. Both studies showed low normal LVEF of 50-55% however the nuclear stress test showed no ischemia. He had only mild left atrial enlargement on his echocardiogram. Apparently Dr. Patty Sermons had discussed the possibility of a cardioversion with him but had recommended at least 1 month of anticoagulation. Today is his 28th day of Eliquis. He has not missed any doses. EKG shows persistent atrial fibrillation.  I saw Mr. Breed back today in the office for follow-up. He underwent successful cardioversion to sinus rhythm and maintains that today. He notes that he's had some improvement in his energy and was able to mow his lawn without having to stop. He started walking again and is doing about a mile a day. Generally seems to be thriving. Heart rate is  noted to be low in the 50s but he noted that it was in the 50s for most of his life. He denies any chest pain, shortness of breath and as mentioned his energy level has improved. He seems to be tolerating Eliquis without any bleeding complications.  02/16/2016  Mr. Fithen is doing well without any complaints today. He is maintaining a sinus bradycardia with heart rate of 47 and is asymptomatic. He is not on any AV nodal blocking medications. He is on Eliquis 5 mg twice a day and has no complaints. He denies any bleeding. He is on Lipitor for dyslipidemia but has not had a recent lipid profile.  He denies chest pain or shortness of breath with exertion.  07/17/2016  Mr. Manchego returns today for follow-up. He is currently in sinus bradycardia with first-degree AV block at 53. He remains on Eliquis for anticoagulation. He was previously on flecainide 50 mg twice daily, but given recent palpitations his medication was increased to 75 mg twice daily and then 100 mg twice daily in the A. fib clinic. He reports good control of her palpitations now although heart rate is low in the 50s. He says with exercise he gets short of breath and has some fatigue which could indicate some chronotropic incompetence.  08/03/2016  Mr. Lazer returns today for follow-up of his stress test. He was able to exercise for 7 minutes and achieved a max predicted heart rate of 92%. He achieved a heart rate of 85% max predicted after 4.5 minutes and had recovery after 7 minutes. There is no evidence of chronotropic incompetence. The study was negative for ischemia. He reports the shortness of breath has improved significantly. He's been able to do some heavy yard work since then without any difficulty. I suspect his symptoms actually were related to probably atrial mechanical inactivity with his recent A. fib which was slow to recover despite the fact that he is flecainide has restored sinus rhythm.  01/30/2017  Mr. Sriram returns today for follow-up. He continues to have bradycardia. He had exercise stress testing which showed improvement in heart rate and no chronotropic incompetence. He is walking now up to about 2 miles. He says his heart rate can get up into the 80s. His shortness of breath is still present with marked exertion but goes away fairly quickly. He is maintaining a sinus bradycardia. He denies any bleeding problems on the Eliquis. He said no labs that we can detect this year. His last lipid profile was September 2017 which showed an LDL 69.  02/21/2018  Mr. Deorio is seen today for annual  follow-up.  He remains asymptomatic and denies any worsening or recurrent atrial fibrillation.  He has persistent bradycardia but says his heart rate goes up with exercise and monitors it with a fit bit.  He does get short of breath only with marked exertion and it improves quickly after resting.  Blood pressures been well controlled.  He denies any adverse bleeding on Eliquis.  He had labs with his PCP at [facility] primary care this year and had a well-controlled lipid profile.  Past Medical History:  Diagnosis Date  . Basal cell carcinoma of auricle of right ear   . BPH (benign prostatic hyperplasia)   . DJD (degenerative joint disease)    left  . ED (erectile dysfunction)   . Fatigue   . Hearing loss, sensorineural   . Hx of adenomatous colonic polyps   . Hx of major depression   . Hypertension   .  Iron deficiency anemia   . Nephrolithiasis   . Paroxysmal atrial fibrillation (HCC)   . Seasonal allergic rhinitis     Past Surgical History:  Procedure Laterality Date  . CARDIOVERSION N/A 08/13/2015   Procedure: CARDIOVERSION;  Surgeon: Chrystie Nose, MD;  Location: [facility] ENDOSCOPY;  Service: Cardiovascular;  Laterality: N/A;  . CARDIOVERSION N/A 04/04/2016   Procedure: CARDIOVERSION;  Surgeon: Quintella Reichert, MD;  Location: [facility] ENDOSCOPY;  Service: Cardiovascular;  Laterality: N/A;  . CARDIOVERSION N/A 11/12/2017   Procedure: CARDIOVERSION;  Surgeon: Chrystie Nose, MD;  Location: [facility] ENDOSCOPY;  Service: Cardiovascular;  Laterality: N/A;  . CHOLECYSTECTOMY    . colonscopy    . herniorraphy Bilateral    had surgery x2 on the left  and right  . TONSILLECTOMY AND ADENOIDECTOMY       Current Outpatient Medications  Medication Sig Dispense Refill  . amLODipine-benazepril (LOTREL) 5-20 MG capsule Take 1 capsule by mouth daily.    [name] apixaban (ELIQUIS) 5 MG TABS tablet Take 5 mg by mouth 2 (two) times daily.    [name] atorvastatin (LIPITOR) 20 MG tablet Take 20 mg by mouth daily.    .  cholecalciferol (VITAMIN D) 1000 units tablet Take 1,000 Units by mouth daily.    . flecainide (TAMBOCOR) 100 MG tablet TAKE 1 TABLET BY MOUTH TWICE DAILY 180 tablet 0  . polycarbophil (FIBERCON) 625 MG tablet Take 1,250 mg by mouth daily.      No current facility-administered medications for this visit.     Allergies:   Patient has no known allergies.    Social History:  The patient  reports that he quit smoking about 57 years ago. His smoking use included cigarettes. He has never used smokeless tobacco. He reports that he does not drink alcohol.   Family History:  The patient's family history includes Alcoholism in his daughter; Arrhythmia in his daughter; CAD in his mother; Cancer - Prostate in his brother; Healthy in his daughter and son; Heart attack in his father; Heart disease in his mother. history father died of a heart attack at age 76.  Mother died of postoperative hemorrhage after heart surgery at age 75   ROS:  Pertinent items noted in HPI and remainder of comprehensive ROS otherwise negative.  PHYSICAL EXAM: VS:  BP (!) 142/69   Pulse (!) 48   Ht 6' (1.829 m)   Wt 196 lb 6.4 oz (89.1 kg)   BMI 26.64 kg/m  , BMI Body mass index is 26.64 kg/m. General appearance: alert and no distress Neck: no carotid bruit, no JVD and thyroid not enlarged, symmetric, no tenderness/mass/nodules Lungs: clear to auscultation bilaterally Heart: regular bradycardia Abdomen: soft, non-tender; bowel sounds normal; no masses,  no organomegaly Extremities: extremities normal, atraumatic, no cyanosis or edema Pulses: 2+ and symmetric Skin: Skin color, texture, turgor normal. No rashes or lesions Neurologic: Grossly normal Psych: pleasant  EKG:  Sinus bradycardia first-degree AV block at 46, nonspecific IVCD, anterior T wave changes, QTC 435 ms-stable, personally reviewed  Recent Labs: 11/06/2017: BUN 12; Creatinine, Ser 0.81; Hemoglobin 12.2; Platelets 346; Potassium 3.8; Sodium 140     Lipid Panel    Component Value Date/Time   CHOL 148 01/31/2017 0849   TRIG 70 01/31/2017 0849   HDL 69 01/31/2017 0849   CHOLHDL 2.1 01/31/2017 0849   CHOLHDL 2.1 02/16/2016 0826   VLDL 17 02/16/2016 0826   LDLCALC 65 01/31/2017 0849      Wt Readings from Last 3 Encounters:  02/21/18 196 lb 6.4 oz (89.1 kg)  11/20/17 197 lb (89.4 kg)  11/12/17 199 lb (90.3 kg)    ASSESSMENT AND PLAN:  1.  Atrial fibrillation - CHADSVASC score of 3 on Eliquis 2.  Essential hypertension 3.  History of hypercholesterolemia 4.  DOE - low risk GXT without chronotropic incompetence  No orders of the defined types were placed in this encounter.  PLAN:    Mr. Heinzerling tends to do well without recurrent A. fib on flecainide.  He has a low heart rate but it does increase with activity.  He had a negative exercise treadmill stress test last year and will be due for repeat testing next year.  Will order that prior to his visit.  Cholesterol is been well controlled and blood pressure is as well.  Repeat blood pressure in the office came down to 130 systolic.  I encouraged him to monitor this at home and continue with his medications.  Follow-up with me annually or sooner as necessary.  Chrystie Nose, MD, [facility], FACP  [place]  [facility]  Medical Director of the Advanced Lipid Disorders &  Cardiovascular Risk Reduction Clinic Diplomate of the American Board of Clinical Lipidology Attending Cardiologist  Direct Dial: [phone]  Fax: [phone]  Website:  [email or web address]  02/21/2018 8:52 AM

## 2018-02-21 NOTE — Patient Instructions (Signed)
Your physician wants you to follow-up in: ONE YEAR with Dr. Debara Pickett. You will receive a reminder letter in the mail two months in advance. If you don't receive a letter, please call our office to schedule the follow-up appointment.  Your physician has requested that you have an exercise tolerance test.  -- please schedule this prior to your next visit with Dr. Debara Pickett.

## 2018-03-25 ENCOUNTER — Other Ambulatory Visit (HOSPITAL_COMMUNITY): Payer: Self-pay | Admitting: Internal Medicine

## 2019-01-16 ENCOUNTER — Other Ambulatory Visit: Payer: Self-pay | Admitting: Internal Medicine

## 2019-01-16 DIAGNOSIS — Z79899 Other long term (current) drug therapy: Secondary | ICD-10-CM

## 2019-01-16 DIAGNOSIS — Z01818 Encounter for other preprocedural examination: Secondary | ICD-10-CM

## 2019-01-27 ENCOUNTER — Other Ambulatory Visit (HOSPITAL_COMMUNITY)
Admission: RE | Admit: 2019-01-27 | Discharge: 2019-01-27 | Disposition: A | Discharge status: Home / Self Care | Payer: Medicare Other | Source: Ambulatory Visit | Attending: Internal Medicine | Admitting: Internal Medicine

## 2019-01-27 DIAGNOSIS — Z20828 Contact with and (suspected) exposure to other viral communicable diseases: Secondary | ICD-10-CM | POA: Diagnosis not present

## 2019-01-27 DIAGNOSIS — Z01812 Encounter for preprocedural laboratory examination: Secondary | ICD-10-CM | POA: Diagnosis present

## 2019-01-27 LAB — SARS CORONAVIRUS 2 (TAT 6-24 HRS): SARS Coronavirus 2: NEGATIVE

## 2019-01-28 ENCOUNTER — Telehealth (HOSPITAL_COMMUNITY): Payer: Self-pay

## 2019-01-28 NOTE — Telephone Encounter (Signed)
Encounter complete. 

## 2019-01-30 ENCOUNTER — Ambulatory Visit (HOSPITAL_COMMUNITY)
Admission: RE | Admit: 2019-01-30 | Discharge: 2019-01-30 | Disposition: A | Discharge status: Home / Self Care | Payer: Medicare Other | Source: Ambulatory Visit | Attending: Cardiovascular Disease | Admitting: Cardiovascular Disease

## 2019-01-30 ENCOUNTER — Other Ambulatory Visit: Payer: Self-pay

## 2019-01-30 DIAGNOSIS — Z79899 Other long term (current) drug therapy: Secondary | ICD-10-CM | POA: Diagnosis present

## 2019-01-30 LAB — EXERCISE TOLERANCE TEST
Estimated workload: 10.1 METS
Exercise duration (min): 9 min
Exercise duration (sec): 0 s
MPHR: 138 {beats}/min
Peak HR: 126 {beats}/min
Percent HR: 91 %
RPE: 17
Rest HR: 49 {beats}/min

## 2019-02-05 ENCOUNTER — Telehealth: Payer: Self-pay | Admitting: Internal Medicine

## 2019-02-05 NOTE — Telephone Encounter (Signed)
Kind of late to stop Eliquis at this point since he already has had the procedure . If it is cauterized, it should not bleed. I would advise continuing Eliquis and monitoring for s/s of bleeding.  Dr. Lemmie Evens

## 2019-02-05 NOTE — Telephone Encounter (Signed)
Patient agrees with dispo

## 2019-02-05 NOTE — Telephone Encounter (Signed)
Patient had basal cell lesion removed off nose earlier today.Area was sutured and cauterized, no skin graft. Area covered with occlusive dressing .The dermatologist was concerned he would bleed late tonight and suggested he go off eliquis for two days.patient is against doing so.    I will FYI Dr.Hilty

## 2019-02-05 NOTE — Telephone Encounter (Signed)
° °  Pt c/o medication issue:  1. Name of Medication: apixaban (ELIQUIS) 5 MG TABS tablet  2. How are you currently taking this medication (dosage and times per day)? As written  3. Are you having a reaction (difficulty breathing--STAT)? no  4. What is your medication issue? Patient wants to hold med for 48 hours. He had a skin procedure done today and is bleeding more than average.

## 2019-02-12 ENCOUNTER — Encounter: Payer: Self-pay | Admitting: Internal Medicine

## 2019-02-12 ENCOUNTER — Other Ambulatory Visit: Payer: Self-pay

## 2019-02-12 ENCOUNTER — Ambulatory Visit (INDEPENDENT_AMBULATORY_CARE_PROVIDER_SITE_OTHER): Payer: Medicare Other | Admitting: Internal Medicine

## 2019-02-12 VITALS — BP 140/69 | HR 47 | Temp 97.3°F | Ht 72.0 in | Wt 199.2 lb

## 2019-02-12 DIAGNOSIS — Z01812 Encounter for preprocedural laboratory examination: Secondary | ICD-10-CM

## 2019-02-12 DIAGNOSIS — R9439 Abnormal result of other cardiovascular function study: Secondary | ICD-10-CM | POA: Diagnosis not present

## 2019-02-12 DIAGNOSIS — Z79899 Other long term (current) drug therapy: Secondary | ICD-10-CM | POA: Diagnosis not present

## 2019-02-12 DIAGNOSIS — Z23 Encounter for immunization: Secondary | ICD-10-CM | POA: Diagnosis not present

## 2019-02-12 NOTE — Patient Instructions (Addendum)
Medication Instructions:  Your physician recommends that you continue on your current medications as directed. Please refer to the Current Medication list given to you today.  If you need a refill on your cardiac medications before your next appointment, please call your pharmacy.   Lab work: BMET - prior to CT study If you have labs (blood work) drawn today and your tests are completely normal, you will receive your results only by: [name] MyChart Message (if you have MyChart) OR . A paper copy in the mail If you have any lab test that is abnormal or we need to change your treatment, we will call you to review the results.  Testing/Procedures: Coronary CT @ [facility]  Follow-Up: At [facility], you and your health needs are our priority.  As part of our continuing mission to provide you with exceptional heart care, we have created designated Provider Care Teams.  These Care Teams include your primary Cardiologist (physician) and Advanced Practice Providers (APPs -  Physician Assistants and Nurse Practitioners) who all work together to provide you with the care you need, when you need it. You will need a follow up appointment in 6-8 weeks after CT (virtual visit OK).  You may see Dr. Debara Pickett or one of the following Advanced Practice Providers on your designated Care Team: Almyra Deforest, [place] . Fabian Sharp, PA-C  Any Other Special Instructions Will Be Listed Below (If Applicable).    Your cardiac CT will be scheduled at one of the below locations:   [facility] [address] [place], [place] [address] [phone]   If scheduled at [facility], please arrive at the [facility] main entrance of [facility] 30-45 minutes prior to test start time.  Proceed to the [facility] Radiology Department (first floor) to check-in and test prep.   Please follow these instructions carefully (unless otherwise directed):  Hold all erectile dysfunction medications at  least 3 days (72 hrs) prior to test.  On the Night Before the Test: . Be sure to Drink plenty of water. . Do not consume any caffeinated/decaffeinated beverages or chocolate 12 hours prior to your test. . Do not take any antihistamines 12 hours prior to your test. .  On the Day of the Test: . Drink plenty of water. Do not drink any water within one hour of the test. . Do not eat any food 4 hours prior to the test. . You may take your regular medications prior to the test.   After the Test: . Drink plenty of water. . After receiving IV contrast, you may experience a mild flushed feeling. This is normal. . On occasion, you may experience a mild rash up to 24 hours after the test. This is not dangerous. If this occurs, you can take Benadryl 25 mg and increase your fluid intake. . If you experience trouble breathing, this can be serious. If it is severe call 911 IMMEDIATELY. If it is mild, please call our office. . If you take any of these medications: Glipizide/Metformin, Avandament, Glucavance, please do not take 48 hours after completing test unless otherwise instructed.    Please contact the cardiac imaging nurse navigator should you have any questions/concerns Marchia Bond, RN Navigator Cardiac Imaging [place] and [facility] Services [phone] Office  [phone] Cell

## 2019-02-12 NOTE — Progress Notes (Signed)
Cardiology Office Note   Date:  02/12/2019   ID:  Colbey, Knopp 1935/04/27, MRN [id number]  PCP:  Kirby Funk, MD   CC: Follow-up stress test   History of Present Illness: Corey [name] is a 84 y.o. male who presents for cardiology evaluation.  He is seen by me for the first time today.  He is seen at the request of Dr. Kirby Funk.  The patient is being seen because of atrial fibrillation of unknown duration.  The patient was seen for a physical in November 2016 by Dr. Valentina Lucks.  No EKG was done at that time according to the patient, but no mention of atrial fibrillation or irregular pulse wasn't mentioned.  Subsequently had a nurse practitioner house call about a week ago who performed a physical examination on him at his home.  She noted an irregular pulse.  Corey [name]'s office was notified and he was seen in Dr. Jone [name] office on 07/14/15 by Corey Rogue, NP, who obtained an electrocardiogram and confirmed the presence of atrial fibrillation. The patient himself is really not aware of his heart rate.  He does not know how long he may have been in the atrial fibrillation.  He began experiencing fatigue several years ago and because of that stop playing golf about 2 years ago.  He would give out after playing about 12 holes of golf.  He has not had any symptoms of congestive heart failure.  He sleeps on one pillow.  He does not have any difficulty climbing stairs.  He has had a sense of vague chest tightness at times.  There is no radiation to the arms.  The past 6 months the patient has also had occasional night sweats which are new. The patient has had some aching in the muscles of his legs.  He is on statin therapy. He has not had any TIA symptoms.  He has not had any dizziness or syncope.  Corey [name] is seen in the office today is a new patient to me however he was recently seen by Dr. Patty Sermons. Unfortunately Dr. Patty Sermons just retired and as Corey [name]  wife is my patient, he is kindly requested my services. This or Boxberger says over the past year he's had some worsening fatigue and decreased energy levels. In fact she's given up a lot of activities including golf which he enjoyed playing. He thought it was just related to being older. He also stopped doing a lot of lawn work and other activities. Recently had a home physical where he was found to be out of rhythm and then had an EKG which demonstrated atrial fibrillation. He was started on Eliquis and was seen by Dr. Patty Sermons. An echocardiogram and stress test were ordered. Both studies showed low normal LVEF of 50-55% however the nuclear stress test showed no ischemia. He had only mild left atrial enlargement on his echocardiogram. Apparently Dr. Patty Sermons had discussed the possibility of a cardioversion with him but had recommended at least 1 month of anticoagulation. Today is his 28th day of Eliquis. He has not missed any doses. EKG shows persistent atrial fibrillation.  I saw Corey [name] back today in the office for follow-up. He underwent successful cardioversion to sinus rhythm and maintains that today. He notes that he's had some improvement in his energy and was able to mow his lawn without having to stop. He started walking again and is doing about a mile a day. Generally seems to be thriving. Heart rate  is noted to be low in the 50s but he noted that it was in the 50s for most of his life. He denies any chest pain, shortness of breath and as mentioned his energy level has improved. He seems to be tolerating Eliquis without any bleeding complications.  02/16/2016  Corey [name] is doing well without any complaints today. He is maintaining a sinus bradycardia with heart rate of 47 and is asymptomatic. He is not on any AV nodal blocking medications. He is on Eliquis 5 mg twice a day and has no complaints. He denies any bleeding. He is on Lipitor for dyslipidemia but has not had a recent  lipid profile. He denies chest pain or shortness of breath with exertion.  07/17/2016  Corey [name] returns today for follow-up. He is currently in sinus bradycardia with first-degree AV block at 53. He remains on Eliquis for anticoagulation. He was previously on flecainide 50 mg twice daily, but given recent palpitations his medication was increased to 75 mg twice daily and then 100 mg twice daily in the A. fib clinic. He reports good control of her palpitations now although heart rate is low in the 50s. He says with exercise he gets short of breath and has some fatigue which could indicate some chronotropic incompetence.  08/03/2016  Corey [name] returns today for follow-up of his stress test. He was able to exercise for 7 minutes and achieved a max predicted heart rate of 92%. He achieved a heart rate of 85% max predicted after 4.5 minutes and had recovery after 7 minutes. There is no evidence of chronotropic incompetence. The study was negative for ischemia. He reports the shortness of breath has improved significantly. He's been able to do some heavy yard work since then without any difficulty. I suspect his symptoms actually were related to probably atrial mechanical inactivity with his recent A. fib which was slow to recover despite the fact that he is flecainide has restored sinus rhythm.  01/30/2017  Corey [name] returns today for follow-up. He continues to have bradycardia. He had exercise stress testing which showed improvement in heart rate and no chronotropic incompetence. He is walking now up to about 2 miles. He says his heart rate can get up into the 80s. His shortness of breath is still present with marked exertion but goes away fairly quickly. He is maintaining a sinus bradycardia. He denies any bleeding problems on the Eliquis. He said no labs that we can detect this year. His last lipid profile was September 2017 which showed an LDL 69.  02/21/2018  Corey [name] is seen  today for annual follow-up.  He remains asymptomatic and denies any worsening or recurrent atrial fibrillation.  He has persistent bradycardia but says his heart rate goes up with exercise and monitors it with a fit bit.  He does get short of breath only with marked exertion and it improves quickly after resting.  Blood pressures been well controlled.  He denies any adverse bleeding on Eliquis.  He had labs with his PCP at [facility] primary care this year and had a well-controlled lipid profile.  02/12/2019  Corey [name] returns today for follow-up of his stress test.  He underwent routine exercise treadmill stress testing due to flecainide for which she takes for atrial flutter.  The treadmill stress test however was abnormal demonstrating significant ST segment depression.  Fortunately, he did not have any symptoms of chest pain or worsening shortness of breath during the study.  A solitary PVC was  noted.  He says afterwards his heart rate was elevated somewhat in the 60s to 80s and bounced around during the day but then went back into the 40s or 50s the next day which is his baseline.  Overall he feels well.  He also had recent Mohs surgery for basal cell carcinoma on his nose, unfortunately they did not hold his anticoagulation prior to the procedure but subsequently requested holding it after he had significant bleeding complications.  Fortunately that has resolved.  Past Medical History:  Diagnosis Date  . Basal cell carcinoma of auricle of right ear   . BPH (benign prostatic hyperplasia)   . DJD (degenerative joint disease)    left  . ED (erectile dysfunction)   . Fatigue   . Hearing loss, sensorineural   . Hx of adenomatous colonic polyps   . Hx of major depression   . Hypertension   . Iron deficiency anemia   . Nephrolithiasis   . Paroxysmal atrial fibrillation (HCC)   . Seasonal allergic rhinitis     Past Surgical History:  Procedure Laterality Date  . CARDIOVERSION N/A 08/13/2015    Procedure: CARDIOVERSION;  Surgeon: Chrystie Nose, MD;  Location: [facility] ENDOSCOPY;  Service: Cardiovascular;  Laterality: N/A;  . CARDIOVERSION N/A 04/04/2016   Procedure: CARDIOVERSION;  Surgeon: Quintella Reichert, MD;  Location: [facility] ENDOSCOPY;  Service: Cardiovascular;  Laterality: N/A;  . CARDIOVERSION N/A 11/12/2017   Procedure: CARDIOVERSION;  Surgeon: Chrystie Nose, MD;  Location: [facility] ENDOSCOPY;  Service: Cardiovascular;  Laterality: N/A;  . CHOLECYSTECTOMY    . colonscopy    . herniorraphy Bilateral    had surgery x2 on the left  and right  . TONSILLECTOMY AND ADENOIDECTOMY       Current Outpatient Medications  Medication Sig Dispense Refill  . amLODipine-benazepril (LOTREL) 5-20 MG capsule Take 1 capsule by mouth daily.    [name] apixaban (ELIQUIS) 5 MG TABS tablet Take 5 mg by mouth 2 (two) times daily.    [name] atorvastatin (LIPITOR) 20 MG tablet Take 20 mg by mouth daily.    . ferrous sulfate 324 MG TBEC Take 324 mg by mouth daily with breakfast.    . flecainide (TAMBOCOR) 100 MG tablet TAKE 1 TABLET BY MOUTH TWICE DAILY 180 tablet 3  . vitamin B-12 (CYANOCOBALAMIN) 500 MCG tablet Take 500 mcg by mouth daily.     No current facility-administered medications for this visit.     Allergies:   Patient has no known allergies.    Social History:  The patient  reports that he quit smoking about 58 years ago. His smoking use included cigarettes. He has never used smokeless tobacco. He reports that he does not drink alcohol.   Family History:  The patient's family history includes Alcoholism in his daughter; Arrhythmia in his daughter; CAD in his mother; Cancer - Prostate in his brother; Healthy in his daughter and son; Heart attack in his father; Heart disease in his mother. history father died of a heart attack at age 25.  Mother died of postoperative hemorrhage after heart surgery at age 86   ROS:  Pertinent items noted in HPI and remainder of comprehensive ROS otherwise negative.   PHYSICAL EXAM: VS:  BP 140/69   Pulse (!) 47   Temp (!) 97.3 F (36.3 C)   Ht 6' (1.829 m)   Wt 199 lb 3.2 oz (90.4 kg)   SpO2 97%   BMI 27.02 kg/m  , BMI Body mass index is 27.02  kg/m. General appearance: alert and no distress Neck: no carotid bruit, no JVD and thyroid not enlarged, symmetric, no tenderness/mass/nodules Lungs: clear to auscultation bilaterally Heart: regular bradycardia Abdomen: soft, non-tender; bowel sounds normal; no masses,  no organomegaly Extremities: extremities normal, atraumatic, no cyanosis or edema Pulses: 2+ and symmetric Skin: Skin color, texture, turgor normal. No rashes or lesions Neurologic: Grossly normal Psych: pleasant  EKG:  Sinus bradycardia 47, nonspecific IVCD, ST and T wave changes anteriorly, QTC 414 ms-personally reviewed  Recent Labs: No results found for requested labs within last 8760 hours.    Lipid Panel    Component Value Date/Time   CHOL 148 01/31/2017 0849   TRIG 70 01/31/2017 0849   HDL 69 01/31/2017 0849   CHOLHDL 2.1 01/31/2017 0849   CHOLHDL 2.1 02/16/2016 0826   VLDL 17 02/16/2016 0826   LDLCALC 65 01/31/2017 0849      Wt Readings from Last 3 Encounters:  02/12/19 199 lb 3.2 oz (90.4 kg)  02/21/18 196 lb 6.4 oz (89.1 kg)  11/20/17 197 lb (89.4 kg)    ASSESSMENT AND PLAN:  1.  Atrial fibrillation - CHADSVASC score of 3 on Eliquis 2.  Essential hypertension 3.  History of hypercholesterolemia 4.  DOE -abnormal exercise treadmill stress test  Orders Placed This Encounter  Procedures  . CT CORONARY MORPH W/CTA COR W/SCORE W/CA W/CM &/OR WO/CM  . CT CORONARY FRACTIONAL FLOW RESERVE DATA PREP  . CT CORONARY FRACTIONAL FLOW RESERVE FLUID ANALYSIS  . Basic metabolic panel   PLAN:    Corey [name] unfortunately had an abnormal exercise treadmill stress test suggestive of possible ischemia versus repolarization abnormality.  He was asymptomatic with this.  He continues to be asymptomatic however it is  potentially dangerous to consider continuing with flecainide if he is ischemic.  I think we need to resolve whether the significant obstructive coronary disease or not.  He did have a nuclear stress test in 2017 however that was mildly abnormal although treated medically.  I do not believe repeating that stress test would be wise as it is likely to be abnormal as well and possibly lead to heart catheterization.  Since he is asymptomatic, would favor a less risky test such as CT coronary angiography.  Will arrange for that.  Plan follow-up with me afterwards.  Chrystie Nose, MD, [facility], FACP  [address]  [facility]  Medical Director of the Advanced Lipid Disorders &  Cardiovascular Risk Reduction Clinic Diplomate of the American Board of Clinical Lipidology Attending Cardiologist  Direct Dial: [phone]  Fax: [phone]  Website:  [email or web address]  02/12/2019 2:26 PM

## 2019-02-14 LAB — BASIC METABOLIC PANEL
BUN/Creatinine Ratio: 12 (ref 10–24)
BUN: 11 mg/dL (ref 8–27)
CO2: 26 mmol/L (ref 20–29)
Calcium: 9.8 mg/dL (ref 8.6–10.2)
Chloride: 101 mmol/L (ref 96–106)
Creatinine, Ser: 0.91 mg/dL (ref 0.76–1.27)
GFR calc Af Amer: 90 mL/min/{1.73_m2} (ref >59)
GFR calc non Af Amer: 78 mL/min/{1.73_m2} (ref >59)
Glucose: 89 mg/dL (ref 65–99)
Potassium: 5 mmol/L (ref 3.5–5.2)
Sodium: 141 mmol/L (ref 134–144)

## 2019-02-17 ENCOUNTER — Other Ambulatory Visit (INDEPENDENT_AMBULATORY_CARE_PROVIDER_SITE_OTHER): Payer: Medicare Other

## 2019-02-17 DIAGNOSIS — I484 Atypical atrial flutter: Secondary | ICD-10-CM

## 2019-02-17 DIAGNOSIS — R0789 Other chest pain: Secondary | ICD-10-CM

## 2019-02-17 DIAGNOSIS — I10 Essential (primary) hypertension: Secondary | ICD-10-CM | POA: Diagnosis not present

## 2019-02-17 DIAGNOSIS — R001 Bradycardia, unspecified: Secondary | ICD-10-CM | POA: Diagnosis not present

## 2019-02-17 DIAGNOSIS — Z79899 Other long term (current) drug therapy: Secondary | ICD-10-CM

## 2019-02-17 DIAGNOSIS — R9439 Abnormal result of other cardiovascular function study: Secondary | ICD-10-CM | POA: Diagnosis not present

## 2019-02-17 DIAGNOSIS — R079 Chest pain, unspecified: Secondary | ICD-10-CM

## 2019-02-17 DIAGNOSIS — I480 Paroxysmal atrial fibrillation: Secondary | ICD-10-CM

## 2019-02-28 ENCOUNTER — Telehealth (HOSPITAL_COMMUNITY): Payer: Self-pay | Admitting: Emergency Medicine

## 2019-02-28 NOTE — Telephone Encounter (Signed)
Pt returning phone call regarding upcoming cardiac imaging study; pt verbalizes understanding of appt date/time, parking situation and where to check in, pre-test NPO status and medications ordered, and verified current allergies; name and call back number provided for further questions should they arise Amberleigh Gerken RN Navigator Cardiac Imaging [facility] [phone] office [phone] cell   

## 2019-02-28 NOTE — Telephone Encounter (Signed)
Left message on voicemail with name and callback number Audreana Hancox RN Navigator Cardiac Imaging [facility] [phone] Office [phone] Cell  

## 2019-03-03 ENCOUNTER — Other Ambulatory Visit: Payer: Self-pay

## 2019-03-03 ENCOUNTER — Ambulatory Visit (HOSPITAL_COMMUNITY)
Admission: RE | Admit: 2019-03-03 | Discharge: 2019-03-03 | Disposition: A | Discharge status: Home / Self Care | Payer: Medicare Other | Source: Ambulatory Visit | Attending: Internal Medicine | Admitting: Internal Medicine

## 2019-03-03 DIAGNOSIS — I480 Paroxysmal atrial fibrillation: Secondary | ICD-10-CM | POA: Insufficient documentation

## 2019-03-03 DIAGNOSIS — Z7901 Long term (current) use of anticoagulants: Secondary | ICD-10-CM | POA: Diagnosis not present

## 2019-03-03 DIAGNOSIS — I700 Atherosclerosis of aorta: Secondary | ICD-10-CM | POA: Diagnosis not present

## 2019-03-03 DIAGNOSIS — R9439 Abnormal result of other cardiovascular function study: Secondary | ICD-10-CM | POA: Diagnosis not present

## 2019-03-03 DIAGNOSIS — Z87891 Personal history of nicotine dependence: Secondary | ICD-10-CM | POA: Insufficient documentation

## 2019-03-03 DIAGNOSIS — Z79899 Other long term (current) drug therapy: Secondary | ICD-10-CM | POA: Diagnosis present

## 2019-03-03 DIAGNOSIS — I10 Essential (primary) hypertension: Secondary | ICD-10-CM | POA: Insufficient documentation

## 2019-03-03 DIAGNOSIS — Z8249 Family history of ischemic heart disease and other diseases of the circulatory system: Secondary | ICD-10-CM | POA: Insufficient documentation

## 2019-03-03 MED ORDER — IOHEXOL 350 MG/ML SOLN
80.0000 mL | Freq: Once | INTRAVENOUS | Status: AC | PRN
  Administered 2019-03-03: 80 mL via INTRAVENOUS

## 2019-03-03 MED ORDER — NITROGLYCERIN 0.4 MG SL SUBL
0.8000 mg | SUBLINGUAL_TABLET | Freq: Once | SUBLINGUAL | Status: AC
  Administered 2019-03-03: 0.8 mg via SUBLINGUAL
  Filled 2019-03-03: qty 25

## 2019-03-03 MED ORDER — NITROGLYCERIN 0.4 MG SL SUBL
SUBLINGUAL_TABLET | SUBLINGUAL | Status: AC
  Filled 2019-03-03: qty 2

## 2019-03-03 NOTE — Progress Notes (Signed)
CT scan completed. Tolerated well. D/C home walking, awake and alert. In no distress. 

## 2019-03-04 ENCOUNTER — Ambulatory Visit (HOSPITAL_COMMUNITY)
Admission: RE | Admit: 2019-03-04 | Discharge: 2019-03-04 | Disposition: A | Discharge status: Home / Self Care | Payer: Medicare Other | Source: Ambulatory Visit | Attending: Internal Medicine | Admitting: Internal Medicine

## 2019-03-04 DIAGNOSIS — R9439 Abnormal result of other cardiovascular function study: Secondary | ICD-10-CM | POA: Diagnosis not present

## 2019-03-04 DIAGNOSIS — Z79899 Other long term (current) drug therapy: Secondary | ICD-10-CM | POA: Diagnosis present

## 2019-03-04 DIAGNOSIS — I700 Atherosclerosis of aorta: Secondary | ICD-10-CM | POA: Diagnosis not present

## 2019-03-26 ENCOUNTER — Other Ambulatory Visit (HOSPITAL_COMMUNITY): Payer: Self-pay | Admitting: Internal Medicine

## 2019-04-17 NOTE — Progress Notes (Signed)
Cardiology Office Note   Date:  04/18/2019   ID:  Sherrill, Teschendorf January 02, 1935, MRN [id number]  PCP:  Kirby Funk, MD  Cardiologist:  Chrystie Nose, MD EP: None  Chief Complaint  Patient presents with  . Follow-up    coronary CTA      History of Present Illness: Corey [name] is a 84 y.o. male with PMH of paroxysmal atrial flutter, HTN, HLD, and recent basal cell carcinoma, who presents for follow-up of his recent coronary CTA.  He was last evaluated by cardiology at an outpatient visit with Dr. Rennis Golden 02/12/2019, in follow-up of recent stress test to evaluate SOB with marked exertion. Stress test revealed significant ST segment depression and he was recommended to undergo a coronary CTA which showed mild-moderate p-mLAD and proximal ramus intermedius branch stenosis which was not significant on FFR.   He presents today to discuss his coronary CTA. He reports ongoing DOE with significant exertion such as yard work, though he can walk 1.5 miles including some hills without difficulty. He is struggling with not being able to do the things he use to do (ie. Walking 18 holes of golf and running 5 miles). He denies any chest pain. He has occasional lightheadedness with sudden position changes but no syncopal episodes. He monitors his heart rate with his fitbit and reports HR frequently in the 40s at rest but responds appropriately to activity. He is compliant with a low sodium diet. No complaints of bleeding with apixaban.   Past Medical History:  Diagnosis Date  . Basal cell carcinoma of auricle of right ear   . BPH (benign prostatic hyperplasia)   . DJD (degenerative joint disease)    left  . ED (erectile dysfunction)   . Fatigue   . Hearing loss, sensorineural   . Hx of adenomatous colonic polyps   . Hx of major depression   . Hypertension   . Iron deficiency anemia   . Nephrolithiasis   . Paroxysmal atrial fibrillation (HCC)   . Seasonal allergic  rhinitis     Past Surgical History:  Procedure Laterality Date  . CARDIOVERSION N/A 08/13/2015   Procedure: CARDIOVERSION;  Surgeon: Chrystie Nose, MD;  Location: [facility] ENDOSCOPY;  Service: Cardiovascular;  Laterality: N/A;  . CARDIOVERSION N/A 04/04/2016   Procedure: CARDIOVERSION;  Surgeon: Quintella Reichert, MD;  Location: [facility] ENDOSCOPY;  Service: Cardiovascular;  Laterality: N/A;  . CARDIOVERSION N/A 11/12/2017   Procedure: CARDIOVERSION;  Surgeon: Chrystie Nose, MD;  Location: [facility] ENDOSCOPY;  Service: Cardiovascular;  Laterality: N/A;  . CHOLECYSTECTOMY    . colonscopy    . herniorraphy Bilateral    had surgery x2 on the left  and right  . TONSILLECTOMY AND ADENOIDECTOMY       Current Outpatient Medications  Medication Sig Dispense Refill  . amLODipine-benazepril (LOTREL) 5-20 MG capsule Take 1 capsule by mouth daily.    [name] apixaban (ELIQUIS) 5 MG TABS tablet Take 5 mg by mouth 2 (two) times daily.    [name] atorvastatin (LIPITOR) 20 MG tablet Take 20 mg by mouth daily.    . ferrous sulfate 324 MG TBEC Take 324 mg by mouth daily with breakfast.    . flecainide (TAMBOCOR) 100 MG tablet Take 1 tablet by mouth twice daily 180 tablet 0  . vitamin B-12 (CYANOCOBALAMIN) 500 MCG tablet Take 500 mcg by mouth daily.     No current facility-administered medications for this visit.     Allergies:   Patient  has no known allergies.    Social History:  The patient  reports that he quit smoking about 58 years ago. His smoking use included cigarettes. He has never used smokeless tobacco. He reports that he does not drink alcohol.   Family History:  The patient's family history includes Alcoholism in his daughter; Arrhythmia in his daughter; CAD in his mother; Cancer - Prostate in his brother; Healthy in his daughter and son; Heart attack in his father; Heart disease in his mother.    ROS:  Please see the history of present illness.   Otherwise, review of systems are positive for none.   All other  systems are reviewed and negative.    PHYSICAL EXAM: VS:  BP 132/68   Pulse (!) 56   Temp (!) 97.2 F (36.2 C)   Ht 6' (1.829 m)   Wt 204 lb 3.2 oz (92.6 kg)   SpO2 98%   BMI 27.69 kg/m  , BMI Body mass index is 27.69 kg/m. GEN: Well nourished, well developed, in no acute distress HEENT: sclera anicteric Neck: no JVD, carotid bruits, or masses Cardiac: RRR; no murmurs, rubs, or gallops, no edema  Respiratory:  clear to auscultation bilaterally, normal work of breathing GI: soft, nontender, nondistended, + BS MS: no deformity or atrophy Skin: warm and dry, no rash Neuro:  Strength and sensation are intact Psych: pleasant, euthymic mood, full affect   EKG:  EKG is not ordered today.   Recent Labs: 02/14/2019: BUN 11; Creatinine, Ser 0.91; Potassium 5.0; Sodium 141    Lipid Panel    Component Value Date/Time   CHOL 148 01/31/2017 0849   TRIG 70 01/31/2017 0849   HDL 69 01/31/2017 0849   CHOLHDL 2.1 01/31/2017 0849   CHOLHDL 2.1 02/16/2016 0826   VLDL 17 02/16/2016 0826   LDLCALC 65 01/31/2017 0849      Wt Readings from Last 3 Encounters:  04/18/19 204 lb 3.2 oz (92.6 kg)  02/12/19 199 lb 3.2 oz (90.4 kg)  02/21/18 196 lb 6.4 oz (89.1 kg)      Other studies Reviewed: Additional studies/ records that were reviewed today include:   Coronary CTA 03/03/2019: IMPRESSION: 1. Mild 25-49% mixed stenosis of the proximal to mid LAD and proximal ramus intermedius branch, CADRADS = 2. CT FFR will be performed and reported separately.  2. Coronary calcium score of 902.86. This was 64th percentile for age and sex matched control. Extensive calcification is predominantly in the LAD, RI and RCA branches.  3. Normal coronary origin with right dominance.  4. Top normal PA size at 29 mm.  IMPRESSION: 1.  CT FFR analysis did not show any significant stenosis.    ASSESSMENT AND PLAN:  1. Non-obstructive coronary artery disease: mild to moderate LAD and ramus  intermedius stenosis. Will need aggressive risk factor modifications at this point with goal BP <130/80, LDL <70, and HgbA1C <7. Not on Aspirin given need for apixaban - Continue statin  2. Paroxysmal atrial flutter/fib: he has been maintaining sinus rhythm on flecainide - Continue flecainide for rhythm control - Continue apixaban for stroke ppx  3. HTN: BP 132/68 today - Continue amlodipine-benazepril  4. HLD: Last LDL 66 08/2018; at goal of <62 - Will ask him to come back for FLP - Continue atorvastatin   Current medicines are reviewed at length with the patient today.  The patient does not have concerns regarding medicines.  The following changes have been made:  no change  Labs/ tests ordered  today include:  No orders of the defined types were placed in this encounter.    Disposition:   FU with Dr. Rennis Golden in 1 year or sooner if issues arise.  Signed, Beatriz Stallion, PA-C  04/18/2019 2:34 PM

## 2019-04-18 ENCOUNTER — Encounter: Payer: Self-pay | Admitting: Medical

## 2019-04-18 ENCOUNTER — Other Ambulatory Visit: Payer: Self-pay

## 2019-04-18 ENCOUNTER — Ambulatory Visit: Payer: Medicare Other | Admitting: Medical

## 2019-04-18 VITALS — BP 132/68 | HR 56 | Temp 97.2°F | Ht 72.0 in | Wt 204.2 lb

## 2019-04-18 DIAGNOSIS — I10 Essential (primary) hypertension: Secondary | ICD-10-CM

## 2019-04-18 DIAGNOSIS — I25119 Atherosclerotic heart disease of native coronary artery with unspecified angina pectoris: Secondary | ICD-10-CM | POA: Diagnosis not present

## 2019-04-18 DIAGNOSIS — I480 Paroxysmal atrial fibrillation: Secondary | ICD-10-CM | POA: Diagnosis not present

## 2019-04-18 DIAGNOSIS — E785 Hyperlipidemia, unspecified: Secondary | ICD-10-CM | POA: Diagnosis not present

## 2019-04-18 NOTE — Patient Instructions (Signed)
Medication Instructions:  Your physician recommends that you continue on your current medications as directed. Please refer to the Current Medication list given to you today.  *If you need a refill on your cardiac medications before your next appointment, please call your pharmacy*  Lab Work: NONE ordered at this time of appointment   If you have labs (blood work) drawn today and your tests are completely normal, you will receive your results only by: . MyChart Message (if you have MyChart) OR . A paper copy in the mail If you have any lab test that is abnormal or we need to change your treatment, we will call you to review the results.  Testing/Procedures: NONE ordered at this time of appointment   Follow-Up: At [facility], you and your health needs are our priority.  As part of our continuing mission to provide you with exceptional heart care, we have created designated Provider Care Teams.  These Care Teams include your primary Cardiologist (physician) and Advanced Practice Providers (APPs -  Physician Assistants and Nurse Practitioners) who all work together to provide you with the care you need, when you need it.  Your next appointment:   12 month(s)  The format for your next appointment:   In Person  Provider:   K. Chad Hilty, MD  Other Instructions   

## 2019-06-07 ENCOUNTER — Ambulatory Visit: Payer: Medicare Other | Attending: Internal Medicine

## 2019-06-07 DIAGNOSIS — Z23 Encounter for immunization: Secondary | ICD-10-CM | POA: Insufficient documentation

## 2019-06-07 NOTE — Progress Notes (Signed)
Covid-19 Vaccination Clinic  Name:  Corey [name]    MRN: [id number] DOB: May 13, 1935  06/07/2019  Corey [name] was observed post Covid-19 immunization for 30 minutes based on pre-vaccination screening without incidence. He was provided with Vaccine Information Sheet and instruction to access the V-Safe system.   Corey [name] was instructed to call 911 with any severe reactions post vaccine: [name] Difficulty breathing  . Swelling of your face and throat  . A fast heartbeat  . A bad rash all over your body  . Dizziness and weakness    Immunizations Administered    Name Date Dose VIS Date Route   Pfizer COVID-19 Vaccine 06/07/2019  1:08 PM 0.3 mL 05/09/2019 Intramuscular   Manufacturer: [employer], [employer]   Lot: [id number]   NDC: [address]-8

## 2019-06-23 ENCOUNTER — Other Ambulatory Visit (HOSPITAL_COMMUNITY): Payer: Self-pay | Admitting: Internal Medicine

## 2019-06-27 ENCOUNTER — Ambulatory Visit: Payer: Medicare Other

## 2019-06-28 ENCOUNTER — Ambulatory Visit: Payer: Medicare Other | Attending: Internal Medicine

## 2019-06-28 DIAGNOSIS — Z23 Encounter for immunization: Secondary | ICD-10-CM

## 2019-06-28 NOTE — Progress Notes (Signed)
Covid-19 Vaccination Clinic  Name:  EAMES CERROS    MRN: [id number] DOB: March 10, 1935  06/28/2019  Mr. Townsend was observed post Covid-19 immunization for 15 minutes without incidence. He was provided with Vaccine Information Sheet and instruction to access the V-Safe system.   Mr. Gair was instructed to call 911 with any severe reactions post vaccine: [name] Difficulty breathing  . Swelling of your face and throat  . A fast heartbeat  . A bad rash all over your body  . Dizziness and weakness    Immunizations Administered    Name Date Dose VIS Date Route   Pfizer COVID-19 Vaccine 06/28/2019  1:55 PM 0.3 mL 05/09/2019 Intramuscular   Manufacturer: [employer], [employer]   Lot: [id number]   NDC: [address]-3

## 2019-11-17 ENCOUNTER — Encounter: Payer: Self-pay | Admitting: Gastroenterology

## 2019-11-28 ENCOUNTER — Emergency Department (HOSPITAL_COMMUNITY)
Admission: EM | Admit: 2019-11-28 | Discharge: 2019-11-28 | Disposition: A | Discharge status: Home / Self Care | Payer: Medicare Other | Attending: Emergency Medicine | Admitting: Emergency Medicine

## 2019-11-28 ENCOUNTER — Other Ambulatory Visit: Payer: Self-pay

## 2019-11-28 ENCOUNTER — Emergency Department (HOSPITAL_COMMUNITY): Payer: Medicare Other

## 2019-11-28 DIAGNOSIS — Z7901 Long term (current) use of anticoagulants: Secondary | ICD-10-CM | POA: Insufficient documentation

## 2019-11-28 DIAGNOSIS — Z79899 Other long term (current) drug therapy: Secondary | ICD-10-CM | POA: Diagnosis not present

## 2019-11-28 DIAGNOSIS — I10 Essential (primary) hypertension: Secondary | ICD-10-CM | POA: Insufficient documentation

## 2019-11-28 DIAGNOSIS — I4891 Unspecified atrial fibrillation: Secondary | ICD-10-CM | POA: Diagnosis not present

## 2019-11-28 DIAGNOSIS — Z87891 Personal history of nicotine dependence: Secondary | ICD-10-CM | POA: Diagnosis not present

## 2019-11-28 DIAGNOSIS — Z85828 Personal history of other malignant neoplasm of skin: Secondary | ICD-10-CM | POA: Diagnosis not present

## 2019-11-28 DIAGNOSIS — R531 Weakness: Secondary | ICD-10-CM

## 2019-11-28 DIAGNOSIS — R6889 Other general symptoms and signs: Secondary | ICD-10-CM | POA: Diagnosis not present

## 2019-11-28 LAB — CBC WITH DIFFERENTIAL/PLATELET
Abs Immature Granulocytes: 0.03 10*3/uL (ref 0.00–0.07)
Basophils Absolute: 0 10*3/uL (ref 0.0–0.1)
Basophils Relative: 0 %
Eosinophils Absolute: 0 10*3/uL (ref 0.0–0.5)
Eosinophils Relative: 0 %
HCT: 42.7 % (ref 39.0–52.0)
Hemoglobin: 14.2 g/dL (ref 13.0–17.0)
Immature Granulocytes: 0 %
Lymphocytes Relative: 8 %
Lymphs Abs: 0.6 10*3/uL — ABNORMAL LOW (ref 0.7–4.0)
MCH: 33.1 pg (ref 26.0–34.0)
MCHC: 33.3 g/dL (ref 30.0–36.0)
MCV: 99.5 fL (ref 80.0–100.0)
Monocytes Absolute: 0.6 10*3/uL (ref 0.1–1.0)
Monocytes Relative: 8 %
Neutro Abs: 5.9 10*3/uL (ref 1.7–7.7)
Neutrophils Relative %: 84 %
Platelets: 313 10*3/uL (ref 150–400)
RBC: 4.29 MIL/uL (ref 4.22–5.81)
RDW: 13 % (ref 11.5–15.5)
WBC: 7.1 10*3/uL (ref 4.0–10.5)
nRBC: 0 % (ref 0.0–0.2)

## 2019-11-28 LAB — COMPREHENSIVE METABOLIC PANEL
ALT: 17 U/L (ref 0–44)
AST: 15 U/L (ref 15–41)
Albumin: 3.7 g/dL (ref 3.5–5.0)
Alkaline Phosphatase: 58 U/L (ref 38–126)
Anion gap: 8 (ref 5–15)
BUN: 18 mg/dL (ref 8–23)
CO2: 26 mmol/L (ref 22–32)
Calcium: 8.9 mg/dL (ref 8.9–10.3)
Chloride: 107 mmol/L (ref 98–111)
Creatinine, Ser: 0.87 mg/dL (ref 0.61–1.24)
GFR calc Af Amer: >60 mL/min (ref >60)
GFR calc non Af Amer: >60 mL/min (ref >60)
Glucose, Bld: 109 mg/dL — ABNORMAL HIGH (ref 70–99)
Potassium: 5.1 mmol/L (ref 3.5–5.1)
Sodium: 141 mmol/L (ref 135–145)
Total Bilirubin: 1 mg/dL (ref 0.3–1.2)
Total Protein: 6.1 g/dL — ABNORMAL LOW (ref 6.5–8.1)

## 2019-11-28 LAB — URINALYSIS, ROUTINE W REFLEX MICROSCOPIC
Bilirubin Urine: NEGATIVE
Glucose, UA: NEGATIVE mg/dL
Hgb urine dipstick: NEGATIVE
Ketones, ur: 5 mg/dL — AB
Leukocytes,Ua: NEGATIVE
Nitrite: NEGATIVE
Protein, ur: NEGATIVE mg/dL
Specific Gravity, Urine: 1.014 (ref 1.005–1.030)
pH: 7 (ref 5.0–8.0)

## 2019-11-28 LAB — TROPONIN I (HIGH SENSITIVITY)
Troponin I (High Sensitivity): 8 ng/L (ref <18)
Troponin I (High Sensitivity): 9 ng/L (ref <18)

## 2019-11-28 MED ORDER — APIXABAN 5 MG PO TABS
5.0000 mg | ORAL_TABLET | Freq: Two times a day (BID) | ORAL | Status: DC
  Administered 2019-11-28: 5 mg via ORAL
  Filled 2019-11-28 (×2): qty 1

## 2019-11-28 MED ORDER — FLECAINIDE ACETATE 100 MG PO TABS
100.0000 mg | ORAL_TABLET | Freq: Two times a day (BID) | ORAL | Status: DC
  Administered 2019-11-28: 100 mg via ORAL
  Filled 2019-11-28 (×2): qty 1

## 2019-11-28 MED ORDER — SERTRALINE HCL 25 MG PO TABS
50.0000 mg | ORAL_TABLET | Freq: Every day | ORAL | Status: DC
  Administered 2019-11-28: 50 mg via ORAL
  Filled 2019-11-28: qty 2

## 2019-11-28 NOTE — ED Triage Notes (Signed)
Pt arrives via [facility] stretcher c/o generalized weakness, SOB, decreased appetite/PO intake for approx two weeks. Per EMS pt became orthostatic when moved from sitting to standing with BP 144/70 HR 50 sitting, BP 100/60 HR 70 standing. Of note pt had recent amlodipine dose change, and new prescription of zoloft.    Pt arrives A+Ox4. Denies CP, SOB, N/V.

## 2019-11-28 NOTE — ED Provider Notes (Signed)
[facility] EMERGENCY DEPARTMENT Provider Note   CSN: [id number] Arrival date & time: 11/28/19  7824     History Chief Complaint  Patient presents with   generalized weakness    [name] is a 85 y.o. male.  HPI   Patient presents to the emergency department with chief complaint of generalized weakness.  Patient states this morning when he woke up he felt hot and weak and had to sit down which prompted him to call EMS for help.  He denies alleviating or aggravating factors.  patient explains over the last 2 weeks he has felt more tired, has lacking interest, lack in appetite, having trouble with sleeping.  He was recently seen at his PCP who placed him on Zoloft.  Patient started this medication on Monday and has been taking it as prescribed.  Admits that he has been depressed in the past and states this feels similar to that episode.  He denies recent trauma, is not in any pain, has no thoughts of harming himself or others.  Patient has significant medical history of depression, hypertension, atrial flutter.  He denies fever, chills, nasal congestion, sore throat, cough, chest pain, shortness of breath, nausea, vomiting, urinary difficulty, pedal edema.  Past Medical History:  Diagnosis Date   Basal cell carcinoma of auricle of right ear    BPH (benign prostatic hyperplasia)    DJD (degenerative joint disease)    left   ED (erectile dysfunction)    Fatigue    Hearing loss, sensorineural    Hx of adenomatous colonic polyps    Hx of major depression    Hypertension    Iron deficiency anemia    Nephrolithiasis    Paroxysmal atrial fibrillation (HCC)    Seasonal allergic rhinitis     Patient Active Problem List   Diagnosis Date Noted   Atrial flutter ([place])    SOB (shortness of breath) 07/18/2016   Encounter for monitoring flecainide therapy 07/18/2016   Long term current use of anticoagulant therapy 08/27/2015   Persistent  atrial fibrillation (HCC)    Paroxysmal atrial fibrillation ([place]) 07/16/2015   Dyslipidemia 07/16/2015   Essential hypertension 07/16/2015   Chest pain of uncertain etiology 23/53/6144   DJD (degenerative joint disease)     Past Surgical History:  Procedure Laterality Date   CARDIOVERSION N/A 08/13/2015   Procedure: CARDIOVERSION;  Surgeon: Pixie Casino, MD;  Location: [facility];  Service: Cardiovascular;  Laterality: N/A;   CARDIOVERSION N/A 04/04/2016   Procedure: CARDIOVERSION;  Surgeon: Sueanne Margarita, MD;  Location: [facility] ENDOSCOPY;  Service: Cardiovascular;  Laterality: N/A;   CARDIOVERSION N/A 11/12/2017   Procedure: CARDIOVERSION;  Surgeon: Pixie Casino, MD;  Location: [facility] ENDOSCOPY;  Service: Cardiovascular;  Laterality: N/A;   CHOLECYSTECTOMY     colonscopy     herniorraphy Bilateral    had surgery x2 on the left  and right   TONSILLECTOMY AND ADENOIDECTOMY         Family History  Problem Relation Age of Onset   CAD Mother    Heart disease Mother    Heart attack Father    Cancer - Prostate Brother    Alcoholism Daughter    Arrhythmia Daughter    Healthy Son    Healthy Daughter     Social History   Tobacco Use   Smoking status: Former Smoker    Types: Cigarettes    Quit date: 07/15/1960    Years since quitting: 59.4   Smokeless  tobacco: Never Used  Substance Use Topics   Alcohol use: No   Drug use: Not on file    Home Medications Prior to Admission medications   Medication Sig Start Date End Date Taking? Authorizing Provider  amLODipine-benazepril (LOTREL) 5-20 MG capsule Take 1 capsule by mouth daily.   Yes [provider]  apixaban (ELIQUIS) 5 MG TABS tablet Take 5 mg by mouth 2 (two) times daily. 07/14/15  Yes [provider]  atorvastatin (LIPITOR) 20 MG tablet Take 20 mg by mouth daily.   Yes [provider]  ferrous sulfate 324 MG TBEC Take 324 mg by mouth daily with breakfast.   Yes [provider]  flecainide (TAMBOCOR) 100 MG tablet Take 1 tablet by mouth twice daily Patient taking differently: Take 100 mg by mouth 2 (two) times daily.  06/23/19  Yes Hilty, Nadean Corwin, MD  sertraline (ZOLOFT) 50 MG tablet Take 50 mg by mouth daily.   Yes [provider]  vitamin B-12 (CYANOCOBALAMIN) 500 MCG tablet Take 500 mcg by mouth daily.   Yes [provider]    Allergies    Patient has no known allergies.  Review of Systems   Review of Systems  Constitutional: Positive for appetite change, chills and fatigue. Negative for fever.  HENT: Negative for congestion, sore throat and tinnitus.   Respiratory: Negative for cough and shortness of breath.   Cardiovascular: Negative for chest pain, palpitations and leg swelling.  Gastrointestinal: Negative for abdominal pain, diarrhea, nausea and vomiting.  Genitourinary: Negative for enuresis, flank pain and frequency.  Musculoskeletal: Negative for back pain and joint swelling.  Skin: Negative for rash.  Neurological: Negative for dizziness, facial asymmetry and headaches.  Hematological: Does not bruise/bleed easily.    Physical Exam Updated Vital Signs BP (!) 153/73    Pulse (!) 51    Temp 98.4 F (36.9 C) (Oral)    Resp 16    Ht 6' (1.829 m)    Wt 84.4 kg    SpO2 99%    BMI 25.23 kg/m   Physical Exam Vitals and nursing note reviewed.  Constitutional:      General: He is not in acute distress.    Appearance: Normal appearance. He is not ill-appearing or diaphoretic.  HENT:     Head: Normocephalic and atraumatic.     Nose: No congestion or rhinorrhea.     Mouth/Throat:     Mouth: Mucous membranes are moist.     Pharynx: Oropharynx is clear.  Eyes:     General: No visual field deficit or scleral icterus.    Conjunctiva/sclera: Conjunctivae normal.     Pupils: Pupils are equal, round, and reactive to light.  Cardiovascular:     Rate and Rhythm: Normal rate and regular rhythm.     Pulses: Normal pulses.       Heart sounds: No murmur heard.  No friction rub. No gallop.   Pulmonary:     Effort: Pulmonary effort is normal. No respiratory distress.     Breath sounds: No wheezing, rhonchi or rales.  Abdominal:     General: There is no distension.     Palpations: Abdomen is soft.     Tenderness: There is no abdominal tenderness. There is no guarding.  Musculoskeletal:        General: No swelling or tenderness.     Right lower leg: No edema.     Left lower leg: No edema.  Skin:    General: Skin is  warm and dry.     Capillary Refill: Capillary refill takes more than 3 seconds.     Findings: No rash.  Neurological:     General: No focal deficit present.     Mental Status: He is alert and oriented to person, place, and time.     GCS: GCS eye subscore is 4. GCS verbal subscore is 5. GCS motor subscore is 6.     Cranial Nerves: Cranial nerves are intact. No cranial nerve deficit or facial asymmetry.     Sensory: Sensation is intact. No sensory deficit.     Motor: Motor function is intact. No weakness or pronator drift.     Coordination: Coordination is intact. Romberg sign negative. Finger-Nose-Finger Test and Heel to [facility] Test normal.  Psychiatric:        Mood and Affect: Mood normal.     ED Results / Procedures / Treatments   Labs (all labs ordered are listed, but only abnormal results are displayed) Labs Reviewed  URINALYSIS, ROUTINE W REFLEX MICROSCOPIC - Abnormal; Notable for the following components:      Result Value   APPearance HAZY (*)    Ketones, ur 5 (*)    All other components within normal limits  COMPREHENSIVE METABOLIC PANEL - Abnormal; Notable for the following components:   Glucose, Bld 109 (*)    Total Protein 6.1 (*)    All other components within normal limits  CBC WITH DIFFERENTIAL/PLATELET - Abnormal; Notable for the following components:   Lymphs Abs 0.6 (*)    All other components within normal limits  TROPONIN I (HIGH SENSITIVITY)  TROPONIN I (HIGH  SENSITIVITY)    EKG EKG Interpretation  Date/Time:  Friday November 28 2019 06:29:54 EDT Ventricular Rate:  57 PR Interval:    QRS Duration: 125 QT Interval:  518 QTC Calculation: 505 R Axis:   -55 Text Interpretation: Sinus rhythm Nonspecific IVCD with LAD Borderline T abnormalities, anterior leads Abnormal ekg Confirmed by Ripley Fraise [phone]) on 11/28/2019 7:02:14 AM   Radiology DG Chest Port 1 View  Result Date: 11/28/2019 CLINICAL DATA:  Weakness EXAM: PORTABLE CHEST 1 VIEW COMPARISON:  07/22/2015 FINDINGS: Cardiac shadow is stable. Lungs are well aerated bilaterally. No focal infiltrate or sizable effusion is seen. No bony abnormality is noted. IMPRESSION: No active disease. Electronically Signed   By: Inez Catalina M.D.   On: 11/28/2019 08:08    Procedures Procedures (including critical care time)  Medications Ordered in ED Medications  apixaban (ELIQUIS) tablet 5 mg (5 mg Oral Given 11/28/19 1055)  sertraline (ZOLOFT) tablet 50 mg (50 mg Oral Given 11/28/19 1055)  flecainide (TAMBOCOR) tablet 100 mg (100 mg Oral Given 11/28/19 1158)    ED Course  I have reviewed the triage vital signs and the nursing notes.  Pertinent labs & imaging results that were available during my care of the patient were reviewed by me and considered in my medical decision making (see chart for details).    MDM Rules/Calculators/A&P                          Due to patient's complaint concern for cardiac abnormality, systemic infection, CVA, metabolic abnormality.  Unlikely patient suffering from CVA as neuro exam was unremarkable, no deficits noted, denies headache, nausea, vomiting, recent falls.  Unlikely patient is suffering from a metabolic abnormality as CMP does not show  electrolyte abnormalities, no elevated liver enzymes, no signs of AKI.  Systemic infection unlikely  as CBC does not show leukocytosis or anemia chest x-ray did not show acute abnormalities, no infiltrates, consolidations, widened  mediastinum noted.  UA did not show any signs of infection negative nitrates negative leukocytes a few ketones noted.  Unlikely patient is suffering from a cardiac abnormality as trending troponin was unremarkable first one was an 8 and second one was a 9 patient denies  chest pain or shortness of breath, EKG was's rhythm without signs of ischemia.  Patient appears to be resting comfortably in bed, showed no acute signs distress.  Vital signs have remained stable does not meet criteria to be admitted to the hospital.  Likely patient suffering from depression he does not have suicidal ideations or homicidal ideations. I recommend patient continues with his SSRI, follows up with PCP and outpatient counseling.  he was discussed with attending who agrees with assessment and plan.  Patient was given at home schedule strict return precautions.  Patient verbalized that he is agreed to said plan. Final Clinical Impression(s) / ED Diagnoses Final diagnoses:  Weak  Lack of interest    Rx / DC Orders ED Discharge Orders    None       Marcello Fennel, PA-C 11/28/19 1217    Sherwood Gambler, MD 11/29/19 1152

## 2019-11-28 NOTE — ED Notes (Signed)
Of note, pt reports losing daughter 14 years ago with similar symptoms. Endorses disinterest in usual activities for the last two weeks as well as generalized wkns, fatigue, decreased appetite.

## 2019-11-28 NOTE — Discharge Instructions (Signed)
You have been seen here for weakness.  All lab work and imaging look reassuring.  I recommend that you continue to take your at home medications as prescribed.  I have given you resources for outpatient counseling, please contact them for further management as I feel counseling may benefit you.  Also recommend that you contact your primary care provider to let them know you were here.  I want you to come back to the emergency department if you develop chest pain, shortness of breath, uncontrolled nausea, vomiting, diarrhea, thoughts of suicide, hurting others, severe depression as he symptoms require further evaluation.

## 2019-12-17 ENCOUNTER — Ambulatory Visit (INDEPENDENT_AMBULATORY_CARE_PROVIDER_SITE_OTHER): Payer: Medicare Other | Admitting: Psychology

## 2019-12-17 DIAGNOSIS — F3289 Other specified depressive episodes: Secondary | ICD-10-CM

## 2019-12-18 ENCOUNTER — Emergency Department (HOSPITAL_COMMUNITY)
Admission: EM | Admit: 2019-12-18 | Discharge: 2019-12-18 | Disposition: A | Discharge status: Home / Self Care | Payer: Medicare Other | Attending: Emergency Medicine | Admitting: Emergency Medicine

## 2019-12-18 ENCOUNTER — Other Ambulatory Visit: Payer: Self-pay

## 2019-12-18 ENCOUNTER — Encounter (HOSPITAL_COMMUNITY): Payer: Self-pay

## 2019-12-18 ENCOUNTER — Emergency Department (HOSPITAL_COMMUNITY): Payer: Medicare Other

## 2019-12-18 ENCOUNTER — Telehealth: Payer: Self-pay | Admitting: Internal Medicine

## 2019-12-18 DIAGNOSIS — Z7901 Long term (current) use of anticoagulants: Secondary | ICD-10-CM | POA: Diagnosis not present

## 2019-12-18 DIAGNOSIS — I10 Essential (primary) hypertension: Secondary | ICD-10-CM | POA: Diagnosis not present

## 2019-12-18 DIAGNOSIS — Z79899 Other long term (current) drug therapy: Secondary | ICD-10-CM | POA: Insufficient documentation

## 2019-12-18 DIAGNOSIS — R531 Weakness: Secondary | ICD-10-CM | POA: Diagnosis present

## 2019-12-18 DIAGNOSIS — Z8042 Family history of malignant neoplasm of prostate: Secondary | ICD-10-CM | POA: Diagnosis not present

## 2019-12-18 DIAGNOSIS — Z87891 Personal history of nicotine dependence: Secondary | ICD-10-CM | POA: Insufficient documentation

## 2019-12-18 DIAGNOSIS — I480 Paroxysmal atrial fibrillation: Secondary | ICD-10-CM | POA: Diagnosis not present

## 2019-12-18 LAB — BASIC METABOLIC PANEL
Anion gap: 10 (ref 5–15)
BUN: 12 mg/dL (ref 8–23)
CO2: 28 mmol/L (ref 22–32)
Calcium: 9.3 mg/dL (ref 8.9–10.3)
Chloride: 97 mmol/L — ABNORMAL LOW (ref 98–111)
Creatinine, Ser: 0.85 mg/dL (ref 0.61–1.24)
GFR calc Af Amer: >60 mL/min (ref >60)
GFR calc non Af Amer: >60 mL/min (ref >60)
Glucose, Bld: 112 mg/dL — ABNORMAL HIGH (ref 70–99)
Potassium: 4.4 mmol/L (ref 3.5–5.1)
Sodium: 135 mmol/L (ref 135–145)

## 2019-12-18 LAB — TROPONIN I (HIGH SENSITIVITY)
Troponin I (High Sensitivity): 6 ng/L (ref <18)
Troponin I (High Sensitivity): 7 ng/L (ref <18)

## 2019-12-18 LAB — CBC
HCT: 45.5 % (ref 39.0–52.0)
Hemoglobin: 15.2 g/dL (ref 13.0–17.0)
MCH: 31.4 pg (ref 26.0–34.0)
MCHC: 33.4 g/dL (ref 30.0–36.0)
MCV: 94 fL (ref 80.0–100.0)
Platelets: 306 10*3/uL (ref 150–400)
RBC: 4.84 MIL/uL (ref 4.22–5.81)
RDW: 12.6 % (ref 11.5–15.5)
WBC: 9.1 10*3/uL (ref 4.0–10.5)
nRBC: 0 % (ref 0.0–0.2)

## 2019-12-18 MED ORDER — SODIUM CHLORIDE 0.9% FLUSH: 3.0000 mL | Freq: Once | INTRAVENOUS | Status: DC

## 2019-12-18 NOTE — Telephone Encounter (Signed)
Incoming call from pt, he reports he has been in afib off and on for over 3 years. He reports that he is on fleccanide "keep it in" and today it has "jumped out" He reports his BP this morning was 137/84 with a HR 54 and this afternoon it was 134/84 with a HR of 82  He reports he had gone to the store and when he had got back home and laid down he could feel his heart beating. He reports he grabbed his heart monitor and it was 75 bpm and showed no abnormalities. He reports he doesn't know how accurate it is. He states he is currently being treated for depression and he was originally on sertraline 50mg  and was increased yesterday to 100mg  and he was unsure if this was causing his issues. He is also on xanax. Pt denies SOB or Chest Pain. He reports that his chest was tight earlier but is "okay" now. He states he just has a funny feeling and "just not right". He reports that normally when he has these episodes he can just feel his heart beating. But today he is fatigued as well as feeling his heart beat.  Attempted to make an appt with AFIB clinic they report that since he had the chest discomfort he needed to be seen in our office. Notified that we did not have any appt until mid august. Mandy with Afib clinic states that they had no appts today.  Notified pt that there are no appts available with our office currently or with the afib clinic and with him being symptomatic and possibly back in afib that he needed to be evaluated today in the Emergency room. Pt asked if we would call ahead and let them know he was coming. Notified I would call and let them know. Was able to get pt in on Jory Sims PA 's schedule for 12/26/19 at 1:45pm. Pt aware to keep this appt and still go to the Emergency Room. Will forward to Dr.Hilty and Jory Sims to make aware.

## 2019-12-18 NOTE — Telephone Encounter (Signed)
Patient c/o Palpitations:  High priority if patient c/o lightheadedness, shortness of breath, or chest pain  1) How long have you had palpitations/irregular HR/ Afib? Are you having the symptoms now? An hour, yes  2) Are you currently experiencing lightheadedness, SOB or CP? Chest tightness  3) Do you have a history of afib (atrial fibrillation) or irregular heart rhythm? yes  4) Have you checked your BP or HR? (document readings if available): while sitting now 134/84 HR 82, this morning 137/84 HR 54  5) Are you experiencing any other symptoms? No   Patient states he thinks he is back in afib and has been having symptoms for an hour. He states his normal resting HR is around 55, but it is 80 while sitting. He states he is also having chest tightness,

## 2019-12-18 NOTE — ED Notes (Signed)
Pt's cap refill at 3-4 sec, but hands are cold. Pt states that his hands are usually cold.  Pt bradycardic but states he is usually in the 50's. S1, S2 clear.

## 2019-12-18 NOTE — ED Triage Notes (Signed)
Pt presents to ED with complaints of increased generalized weakness today. PT states he took VS-  HR was in the 70's   (basline is 50s). PT states he was afraid he may be back in a fib. Denies CP, SOB. Endorses increased weakness, anxiety.

## 2019-12-18 NOTE — ED Provider Notes (Signed)
[name] EMERGENCY [facility] Provider Note   CSN: [id number] Arrival date & time: 12/18/19  1522     History Chief Complaint  Patient presents with  . Weakness    [facility]y [name] is a 85 y.o. male.  HPI   85 y/o male - hx of afib s/p cardioversion - present with a feeling of heart racing today - has had flecainide and doing well - but today had palpitations - he has since improved and is at baseline without any sx at this time - denies weakness to me, denies headache, chest pain or any other sx - he is on eliquis, He has been feeling good for the last few hours without complaints  He reports being started on zoloft a month ago - had dose increased on Monday from 50mg  to 100mg  per day and had xanax added - he has not had any improved in his depression since starting meds.  Dr. Debara Pickett is cardiologist.  Past Medical History:  Diagnosis Date  . Basal cell carcinoma of auricle of right ear   . BPH (benign prostatic hyperplasia)   . DJD (degenerative joint disease)    left  . ED (erectile dysfunction)   . Fatigue   . Hearing loss, sensorineural   . Hx of adenomatous colonic polyps   . Hx of major depression   . Hypertension   . Iron deficiency anemia   . Nephrolithiasis   . Paroxysmal atrial fibrillation (HCC)   . Seasonal allergic rhinitis     Patient Active Problem List   Diagnosis Date Noted  . Atrial flutter ([place])   . SOB (shortness of breath) 07/18/2016  . Encounter for monitoring flecainide therapy 07/18/2016  . Long term current use of anticoagulant therapy 08/27/2015  . Persistent atrial fibrillation ([place])   . Paroxysmal atrial fibrillation ([place]) 07/16/2015  . Dyslipidemia 07/16/2015  . Essential hypertension 07/16/2015  . Chest pain of uncertain etiology 18/56/3149  . DJD (degenerative joint disease)     Past Surgical History:  Procedure Laterality Date  . CARDIOVERSION N/A 08/13/2015   Procedure: CARDIOVERSION;  Surgeon: Pixie Casino, MD;  Location: [facility] ENDOSCOPY;  Service: Cardiovascular;  Laterality: N/A;  . CARDIOVERSION N/A 04/04/2016   Procedure: CARDIOVERSION;  Surgeon: Sueanne Margarita, MD;  Location: [facility] ENDOSCOPY;  Service: Cardiovascular;  Laterality: N/A;  . CARDIOVERSION N/A 11/12/2017   Procedure: CARDIOVERSION;  Surgeon: Pixie Casino, MD;  Location: [facility] ENDOSCOPY;  Service: Cardiovascular;  Laterality: N/A;  . CHOLECYSTECTOMY    . colonscopy    . herniorraphy Bilateral    had surgery x2 on the left  and right  . TONSILLECTOMY AND ADENOIDECTOMY         Family History  Problem Relation Age of Onset  . CAD Mother   . Heart disease Mother   . Heart attack Father   . Cancer - Prostate Brother   . Alcoholism Daughter   . Arrhythmia Daughter   . Healthy Son   . Healthy Daughter     Social History   Tobacco Use  . Smoking status: Former Smoker    Types: Cigarettes    Quit date: 07/15/1960    Years since quitting: 59.4  . Smokeless tobacco: Never Used  Substance Use Topics  . Alcohol use: No  . Drug use: Not on file    Home Medications Prior to Admission medications   Medication Sig Start Date End Date Taking? Authorizing Provider  amLODipine-benazepril (LOTREL) 5-20 MG capsule Take  1 capsule by mouth daily.    [provider]  apixaban (ELIQUIS) 5 MG TABS tablet Take 5 mg by mouth 2 (two) times daily. 07/14/15   [provider]  atorvastatin (LIPITOR) 20 MG tablet Take 20 mg by mouth daily.    [provider]  ferrous sulfate 324 MG TBEC Take 324 mg by mouth daily with breakfast.    [provider]  flecainide (TAMBOCOR) 100 MG tablet Take 1 tablet by mouth twice daily Patient taking differently: Take 100 mg by mouth 2 (two) times daily.  06/23/19   Hilty, Nadean Corwin, MD  sertraline (ZOLOFT) 50 MG tablet Take 50 mg by mouth daily.    [provider]  vitamin B-12 (CYANOCOBALAMIN) 500 MCG tablet Take 500 mcg by mouth daily.    [provider]    Allergies    Patient has no known allergies.  Review of Systems   Review of Systems  All other systems reviewed and are negative.   Physical Exam Updated Vital Signs BP (!) 178/79   Pulse 50   Temp 98.2 F (36.8 C) (Oral)   Resp 15   SpO2 (!) 17%   Physical Exam Vitals and nursing note reviewed.  Constitutional:      General: He is not in acute distress.    Appearance: He is well-developed.  HENT:     Head: Normocephalic and atraumatic.     Mouth/Throat:     Pharynx: No oropharyngeal exudate.  Eyes:     General: No scleral icterus.       Right eye: No discharge.        Left eye: No discharge.     Conjunctiva/sclera: Conjunctivae normal.     Pupils: Pupils are equal, round, and reactive to light.  Neck:     Thyroid: No thyromegaly.     Vascular: No JVD.  Cardiovascular:     Rate and Rhythm: Regular rhythm. Bradycardia present.     Heart sounds: Normal heart sounds. No murmur heard.  No friction rub. No gallop.      Comments: HR of 55 on my exam, no murmurs, normal pulses, no JVD. Pulmonary:     Effort: Pulmonary effort is normal. No respiratory distress.     Breath sounds: Normal breath sounds. No wheezing or rales.  Abdominal:     General: Bowel sounds are normal. There is no distension.     Palpations: Abdomen is soft. There is no mass.     Tenderness: There is no abdominal tenderness.  Musculoskeletal:        General: No tenderness. Normal range of motion.     Cervical back: Normal range of motion and neck supple.  Lymphadenopathy:     Cervical: No cervical adenopathy.  Skin:    General: Skin is warm and dry.     Findings: No erythema or rash.  Neurological:     Mental Status: He is alert.     Coordination: Coordination normal.  Psychiatric:        Behavior: Behavior normal.     ED Results / Procedures / Treatments   Labs (all labs ordered are listed, but only abnormal results are displayed) Labs Reviewed  BASIC METABOLIC PANEL  - Abnormal; Notable for the following components:      Result Value   Chloride 97 (*)    Glucose, Bld 112 (*)    All other components within normal limits  CBC  TROPONIN I (HIGH SENSITIVITY)  TROPONIN I (HIGH  SENSITIVITY)    EKG EKG Interpretation  Date/Time:  Thursday December 18 2019 15:30:02 EDT Ventricular Rate:  69 PR Interval:  168 QRS Duration: 122 QT Interval:  416 QTC Calculation: 445 R Axis:   -39 Text Interpretation: Normal sinus rhythm Left axis deviation Non-specific intra-ventricular conduction delay Nonspecific T wave abnormality Abnormal ECG since last tracing no significant change Confirmed by Noemi Chapel [phone]) on 12/18/2019 8:39:10 PM   Radiology DG Chest 2 View  Result Date: 12/18/2019 CLINICAL DATA:  Chest pain EXAM: CHEST - 2 VIEW COMPARISON:  None. FINDINGS: The heart size and mediastinal contours are within normal limits. Both lungs are clear. No pleural effusion or pneumothorax. The visualized skeletal structures are unremarkable. Surgical clips overlie the right upper quadrant. IMPRESSION: No acute process in the chest. Electronically Signed   By: Macy Mis M.D.   On: 12/18/2019 16:33    Procedures Procedures (including critical care time)  Medications Ordered in ED Medications  sodium chloride flush (NS) 0.9 % injection 3 mL (has no administration in time range)    ED Course  I have reviewed the triage vital signs and the nursing notes.  Pertinent labs & imaging results that were available during my care of the patient were reviewed by me and considered in my medical decision making (see chart for details).    MDM Rules/Calculators/A&P                          This patient appears well, he is hypertensive at 178/79 but afebrile, oxygen is 100% on room air, pulse is around 55 and EKG shows no changes from his last EKG.  There is no arrhythmias of any concern, he is in a sinus rhythm.  I will discuss with cardiology regarding potential  medication management but at this time he appears well and is not in atrial fibrillation.  He is already anticoagulated.  discussed with cardiology on-call, they recommend the patient can follow-up in the outpatient setting, no need to bring the patient in or do anything emergently given that he is currently in sinus rhythm, appropriately anticoagulated  Final Clinical Impression(s) / ED Diagnoses Final diagnoses:  Paroxysmal atrial fibrillation (HCC)      Noemi Chapel, MD 12/18/19 2131

## 2019-12-18 NOTE — Discharge Instructions (Signed)
Please call the office of Dr. Debara Pickett to arrange for a follow-up this week.  They may want to do further investigation with a Holter monitor.  If you should develop recurrent palpitations chest pain or severe symptoms please call your doctors office immediately return to the emergency department.  You should continue to take your flecainide and your Eliquis as prescribed by your doctor

## 2019-12-19 NOTE — Telephone Encounter (Signed)
Thank you Budd Freiermuth!

## 2019-12-23 ENCOUNTER — Ambulatory Visit: Payer: Medicare Other | Admitting: Gastroenterology

## 2019-12-26 ENCOUNTER — Encounter: Payer: Self-pay | Admitting: Cardiology

## 2019-12-26 ENCOUNTER — Ambulatory Visit: Payer: Medicare Other | Admitting: General Practice

## 2019-12-26 ENCOUNTER — Other Ambulatory Visit: Payer: Self-pay

## 2019-12-26 ENCOUNTER — Ambulatory Visit: Payer: Medicare Other | Admitting: Cardiology

## 2019-12-26 ENCOUNTER — Ambulatory Visit: Payer: Medicare Other | Admitting: Adult Health

## 2019-12-26 VITALS — BP 154/80 | HR 60 | Ht 72.0 in | Wt 181.0 lb

## 2019-12-26 DIAGNOSIS — I480 Paroxysmal atrial fibrillation: Secondary | ICD-10-CM

## 2019-12-26 DIAGNOSIS — F419 Anxiety disorder, unspecified: Secondary | ICD-10-CM | POA: Diagnosis not present

## 2019-12-26 DIAGNOSIS — Z7901 Long term (current) use of anticoagulants: Secondary | ICD-10-CM

## 2019-12-26 DIAGNOSIS — F329 Major depressive disorder, single episode, unspecified: Secondary | ICD-10-CM

## 2019-12-26 DIAGNOSIS — I10 Essential (primary) hypertension: Secondary | ICD-10-CM

## 2019-12-26 DIAGNOSIS — F32A Depression, unspecified: Secondary | ICD-10-CM

## 2019-12-26 NOTE — Assessment & Plan Note (Signed)
Unclear what the trigger was but apparently this has been a significant episode with 20 lb weight loss over 6 weeks according to the patient (TSH in July by PCP was WNL).  Zoloft has been increased a week ago and he has had a slight increase in his QTc. I will check a f/u EKG in two weeks, if no significant QT prolongation he can keep his f/u with Dr Debara Pickett in Nov as scheduled.

## 2019-12-26 NOTE — Patient Instructions (Signed)
Medication Instructions:  Your physician recommends that you continue on your current medications as directed. Please refer to the Current Medication list given to you today.  *If you need a refill on your cardiac medications before your next appointment, please call your pharmacy*   Testing/Procedures:  NURSE VISIT EKG scheduled on Friday, 01/09/20 at 10:30 AM.   Follow-Up: At [facility], you and your health needs are our priority.  As part of our continuing mission to provide you with exceptional heart care, we have created designated Provider Care Teams.  These Care Teams include your primary Cardiologist (physician) and Advanced Practice Providers (APPs -  Physician Assistants and Nurse Practitioners) who all work together to provide you with the care you need, when you need it.  We recommend signing up for the patient portal called "MyChart".  Sign up information is provided on this After Visit Summary.  MyChart is used to connect with patients for Virtual Visits (Telemedicine).  Patients are able to view lab/test results, encounter notes, upcoming appointments, etc.  Non-urgent messages can be sent to your provider as well.   To learn more about what you can do with MyChart, go to [email or web address].    Your next appointment:   Keep your scheduled follow-up appointment with Dr. Debara Pickett in November

## 2019-12-26 NOTE — Assessment & Plan Note (Signed)
Controlled.  

## 2019-12-26 NOTE — Assessment & Plan Note (Signed)
CHADs VASc=3, on Eliquis

## 2019-12-26 NOTE — Progress Notes (Signed)
Cardiology Office Note:    Date:  12/26/2019   ID:  Trestyn, Child 14-Aug-1934, MRN [id number]  PCP:  Kirby Funk, MD  Cardiologist:  Chrystie Nose, MD  Electrophysiologist:  None   Referring MD: Kirby Funk, MD   CC: tachycardia- ED visit 12/18/2019  History of Present Illness:    Corey [name] is a 85 y.o. male with a hx of PAF, NSR on Flecainide, CAD (moderate LAD disease) noted on CTA after abnormal GXT Sept 2020, HTN, and HLD.  He is seen in the office today as an add on after he was seen in the ED with increased heart rate.  The patient has chronic baseline bradycardia with HR 50-60.  He became concerned when he noted his HR was "in the 80-90" range on his Fit Bit.  Complicating this scenario is the fact the the patient has been suffering from severe depression and anxiety that last 1-2 months.  He says he has lost "20 lbs".  His PCP has increased the patient's Zoloft. His TSH was WNL.  The patient has had depression in the past but it was 14 years ago after the death of his daughter.  He could not identify any specific cause for his current problem.   Past Medical History:  Diagnosis Date  . Basal cell carcinoma of auricle of right ear   . BPH (benign prostatic hyperplasia)   . DJD (degenerative joint disease)    left  . ED (erectile dysfunction)   . Fatigue   . Hearing loss, sensorineural   . Hx of adenomatous colonic polyps   . Hx of major depression   . Hypertension   . Iron deficiency anemia   . Nephrolithiasis   . Paroxysmal atrial fibrillation (HCC)   . Seasonal allergic rhinitis     Past Surgical History:  Procedure Laterality Date  . CARDIOVERSION N/A 08/13/2015   Procedure: CARDIOVERSION;  Surgeon: Chrystie Nose, MD;  Location: [facility] ENDOSCOPY;  Service: Cardiovascular;  Laterality: N/A;  . CARDIOVERSION N/A 04/04/2016   Procedure: CARDIOVERSION;  Surgeon: Quintella Reichert, MD;  Location: [facility] ENDOSCOPY;  Service: Cardiovascular;  Laterality:  N/A;  . CARDIOVERSION N/A 11/12/2017   Procedure: CARDIOVERSION;  Surgeon: Chrystie Nose, MD;  Location: [facility] ENDOSCOPY;  Service: Cardiovascular;  Laterality: N/A;  . CHOLECYSTECTOMY    . colonscopy    . herniorraphy Bilateral    had surgery x2 on the left  and right  . TONSILLECTOMY AND ADENOIDECTOMY      Current Medications: Current Meds  Medication Sig  . ALPRAZolam (XANAX) 0.5 MG tablet Take 0.5 mg by mouth. 1/2 tab in the AM and 1/2 tab in the evening  . amLODipine-benazepril (LOTREL) 5-20 MG capsule Take 1 capsule by mouth daily.  [name] apixaban (ELIQUIS) 5 MG TABS tablet Take 5 mg by mouth 2 (two) times daily.  [name] atorvastatin (LIPITOR) 20 MG tablet Take 20 mg by mouth daily.  . ferrous sulfate 324 MG TBEC Take 324 mg by mouth daily with breakfast.  . flecainide (TAMBOCOR) 100 MG tablet Take 1 tablet by mouth twice daily (Patient taking differently: Take 100 mg by mouth 2 (two) times daily. )  . sertraline (ZOLOFT) 100 MG tablet Take 100 mg by mouth daily.  . vitamin B-12 (CYANOCOBALAMIN) 500 MCG tablet Take 500 mcg by mouth daily.     Allergies:   Patient has no known allergies.   Social History   Socioeconomic History  . Marital status: Married  Spouse name: Not on file  . Number of children: Not on file  . Years of education: Not on file  . Highest education level: Not on file  Occupational History  . Not on file  Tobacco Use  . Smoking status: Former Smoker    Types: Cigarettes    Quit date: 07/15/1960    Years since quitting: 59.4  . Smokeless tobacco: Never Used  Substance and Sexual Activity  . Alcohol use: No  . Drug use: Not on file  . Sexual activity: Yes  Other Topics Concern  . Not on file  Social History Narrative  . Not on file   Social Determinants of Health   Financial Resource Strain:   . Difficulty of Paying Living Expenses:   Food Insecurity:   . Worried About Programme researcher, broadcasting/film/video in the Last Year:   . Barista in the Last Year:    Transportation Needs:   . Freight forwarder (Medical):   [name] Lack of Transportation (Non-Medical):   Physical Activity:   . Days of Exercise per Week:   . Minutes of Exercise per Session:   Stress:   . Feeling of Stress :   Social Connections:   . Frequency of Communication with Friends and Family:   . Frequency of Social Gatherings with Friends and Family:   . Attends Religious Services:   . Active Member of Clubs or Organizations:   . Attends Banker Meetings:   [name] Marital Status:      Family History: The patient's family history includes Alcoholism in his daughter; Arrhythmia in his daughter; CAD in his mother; Cancer - Prostate in his brother; Healthy in his daughter and son; Heart attack in his father; Heart disease in his mother.  ROS:   Please see the history of present illness.     All other systems reviewed and are negative.  EKGs/Labs/Other Studies Reviewed:    The following studies were reviewed today: Coronary CTA Oct 2020-  EKG:  EKG is ordered today.  The ekg ordered today demonstrates NSR, HR 60- QTc 470.   Recent Labs: 11/28/2019: ALT 17 12/18/2019: BUN 12; Creatinine, Ser 0.85; Hemoglobin 15.2; Platelets 306; Potassium 4.4; Sodium 135  Recent Lipid Panel    Component Value Date/Time   CHOL 148 01/31/2017 0849   TRIG 70 01/31/2017 0849   HDL 69 01/31/2017 0849   CHOLHDL 2.1 01/31/2017 0849   CHOLHDL 2.1 02/16/2016 0826   VLDL 17 02/16/2016 0826   LDLCALC 65 01/31/2017 0849    Physical Exam:    VS:  BP (!) 154/80   Pulse 60   Ht 6' (1.829 m)   Wt 181 lb (82.1 kg)   BMI 24.55 kg/m     Wt Readings from Last 3 Encounters:  12/26/19 181 lb (82.1 kg)  11/28/19 186 lb (84.4 kg)  04/18/19 204 lb 3.2 oz (92.6 kg)     GEN:  Well nourished, well developed in no acute distress HEENT: Normal NECK: No JVD; No carotid bruits LYMPHATICS: No lymphadenopathy CARDIAC: RRR, no murmurs, rubs, gallops RESPIRATORY:  Clear to auscultation  without rales, wheezing or rhonchi  ABDOMEN: Soft, non-tender, non-distended MUSCULOSKELETAL:  No edema; No deformity  SKIN: Warm and dry NEUROLOGIC:  Alert and oriented x 3 PSYCHIATRIC:  Normal affect   ASSESSMENT:    Paroxysmal atrial fibrillation (HCC) DCCV to NSR 08/13/15. NSR on Flecainide.  Recent "tachycardia" was not demonstrated to be AF. I reassured him he was  in NSR.  Long term current use of anticoagulant therapy CHADs VASc=3, on Eliquis  Essential hypertension Controlled  Anxiety and depression Unclear what the trigger was but apparently this has been a significant episode with 20 lb weight loss over 6 weeks according to the patient (TSH in July by PCP was WNL).  Zoloft has been increased a week ago and he has had a slight increase in his QTc. I will check a f/u EKG in two weeks, if no significant QT prolongation he can keep his f/u with Dr Rennis Golden in Nov as scheduled.  PLAN:    Reassurance.  Check EKG for QT on increased Zoloft in two weeks.   Medication Adjustments/Labs and Tests Ordered: Current medicines are reviewed at length with the patient today.  Concerns regarding medicines are outlined above.  Orders Placed This Encounter  Procedures  . EKG 12-Lead   No orders of the defined types were placed in this encounter.   Patient Instructions  Medication Instructions:  Your physician recommends that you continue on your current medications as directed. Please refer to the Current Medication list given to you today.  *If you need a refill on your cardiac medications before your next appointment, please call your pharmacy*   Testing/Procedures:  NURSE VISIT EKG scheduled on Friday, 01/09/20 at 10:30 AM.   Follow-Up: At [facility], you and your health needs are our priority.  As part of our continuing mission to provide you with exceptional heart care, we have created designated Provider Care Teams.  These Care Teams include your primary Cardiologist  (physician) and Advanced Practice Providers (APPs -  Physician Assistants and Nurse Practitioners) who all work together to provide you with the care you need, when you need it.  We recommend signing up for the patient portal called "MyChart".  Sign up information is provided on this After Visit Summary.  MyChart is used to connect with patients for Virtual Visits (Telemedicine).  Patients are able to view lab/test results, encounter notes, upcoming appointments, etc.  Non-urgent messages can be sent to your provider as well.   To learn more about what you can do with MyChart, go to [email or web address].    Your next appointment:   Keep your scheduled follow-up appointment with Dr. Rennis Golden in November       Signed, Luke Kilroy, [place]  12/26/2019 12:46 PM    [facility]

## 2019-12-26 NOTE — Assessment & Plan Note (Signed)
DCCV to NSR 08/13/15. NSR on Flecainide.  Recent "tachycardia" was not demonstrated to be AF. I reassured him he was in NSR.

## 2020-01-06 ENCOUNTER — Other Ambulatory Visit: Payer: Self-pay | Admitting: Internal Medicine

## 2020-01-06 ENCOUNTER — Ambulatory Visit
Admission: RE | Admit: 2020-01-06 | Discharge: 2020-01-06 | Disposition: A | Discharge status: Home / Self Care | Payer: Medicare Other | Source: Ambulatory Visit | Attending: Internal Medicine | Admitting: Internal Medicine

## 2020-01-06 DIAGNOSIS — R0602 Shortness of breath: Secondary | ICD-10-CM

## 2020-01-09 ENCOUNTER — Other Ambulatory Visit: Payer: Self-pay

## 2020-01-09 ENCOUNTER — Ambulatory Visit (INDEPENDENT_AMBULATORY_CARE_PROVIDER_SITE_OTHER): Payer: Medicare Other | Admitting: Cardiology

## 2020-01-09 ENCOUNTER — Encounter: Payer: Self-pay | Admitting: Cardiology

## 2020-01-09 VITALS — BP 156/83 | HR 57 | Ht 72.0 in | Wt 178.4 lb

## 2020-01-09 DIAGNOSIS — I480 Paroxysmal atrial fibrillation: Secondary | ICD-10-CM

## 2020-01-09 NOTE — Progress Notes (Signed)
I had Corey [name] come in today for an EKG.  I was a little concerned about his antidepressants being adjusted and I wanted to check his QTc.  His EKG today shows sinus rhythm sinus bradycardia with left axis deviation and nonspecific IVCD and a QTC of 471.  I reassured him that his EKG looked good and that we will not change his current cardiac  treatment.  He says his family doctor continues to adjust his antidepressants and he is had some progress there. Keep f/u as scheduled.  Kerin Ransom PA-C 01/09/2020 10:46 AM

## 2020-01-12 ENCOUNTER — Encounter: Payer: Self-pay | Admitting: Cardiology

## 2020-01-12 ENCOUNTER — Ambulatory Visit (INDEPENDENT_AMBULATORY_CARE_PROVIDER_SITE_OTHER): Payer: Medicare Other | Admitting: Psychology

## 2020-01-12 DIAGNOSIS — F3289 Other specified depressive episodes: Secondary | ICD-10-CM | POA: Diagnosis not present

## 2020-01-12 NOTE — Progress Notes (Signed)
I had Mr. Martos come in today for an EKG.  I was a little concerned about his antidepressants being adjusted and I wanted to check his QTc.  His EKG today shows sinus rhythm sinus bradycardia with left axis deviation and nonspecific IVCD and a QTC of 471.  I reassured him that his EKG looked good and that we will not change his current cardiac  treatment.  He says his family doctor continues to adjust his antidepressants and he is had some progress there. Keep f/u as scheduled.  Kerin Ransom PA-C 01/09/2020 10:46 AM

## 2020-03-20 ENCOUNTER — Other Ambulatory Visit (HOSPITAL_COMMUNITY): Payer: Self-pay | Admitting: Internal Medicine

## 2020-04-20 ENCOUNTER — Other Ambulatory Visit: Payer: Self-pay

## 2020-04-20 ENCOUNTER — Encounter: Payer: Self-pay | Admitting: Internal Medicine

## 2020-04-20 ENCOUNTER — Ambulatory Visit: Payer: Medicare Other | Admitting: Internal Medicine

## 2020-04-20 VITALS — BP 150/76 | HR 45 | Temp 95.7°F | Ht 72.0 in | Wt 203.2 lb

## 2020-04-20 DIAGNOSIS — I480 Paroxysmal atrial fibrillation: Secondary | ICD-10-CM | POA: Diagnosis not present

## 2020-04-20 DIAGNOSIS — Z7901 Long term (current) use of anticoagulants: Secondary | ICD-10-CM

## 2020-04-20 DIAGNOSIS — Z5181 Encounter for therapeutic drug level monitoring: Secondary | ICD-10-CM

## 2020-04-20 DIAGNOSIS — I10 Essential (primary) hypertension: Secondary | ICD-10-CM

## 2020-04-20 DIAGNOSIS — Z79899 Other long term (current) drug therapy: Secondary | ICD-10-CM

## 2020-04-20 NOTE — Patient Instructions (Signed)

## 2020-04-20 NOTE — Progress Notes (Addendum)
Cardiology Office Note  Date:  04/20/2020   ID:  Corey [name], Corey [name] 03/19/1935, MRN [id number]  PCP:  Corey Funk, MD   CC: Follow-up stress test   History of Present Illness: Corey [name] is a 85 y.o. male who presents for cardiology evaluation.  He is seen by me for the first time today.  He is seen at the request of Dr. Kirby [name].  The patient is being seen because of atrial fibrillation of unknown duration.  The patient was seen for a physical in November 2016 by Dr. Valentina [name].  No EKG was done at that time according to the patient, but no mention of atrial fibrillation or irregular pulse wasn't mentioned.  Subsequently had a nurse practitioner house call about a week ago who performed a physical examination on him at his home.  She noted an irregular pulse.  Corey [name]'s office was notified and he was seen in Dr. Jone [name] office on 07/14/15 by Corey Rogue, NP, who obtained an electrocardiogram and confirmed the presence of atrial fibrillation. The patient himself is really not aware of his heart rate.  He does not know how long he may have been in the atrial fibrillation.  He began experiencing fatigue several years ago and because of that stop playing golf about 2 years ago.  He would give out after playing about 12 holes of golf.  He has not had any symptoms of congestive heart failure.  He sleeps on one pillow.  He does not have any difficulty climbing stairs.  He has had a sense of vague chest tightness at times.  There is no radiation to the arms.  The past 6 months the patient has also had occasional night sweats which are new. The patient has had some aching in the muscles of his legs.  He is on statin therapy. He has not had any TIA symptoms.  He has not had any dizziness or syncope.  Corey [name] is seen in the office today is a new patient to me however he was recently seen by Dr. Patty [name]. Unfortunately Dr. Patty [name] just retired and as Corey [name]  wife is my patient, he is kindly requested my services. This or [name] says over the past year he's had some worsening fatigue and decreased energy levels. In fact she's given up a lot of activities including golf which he enjoyed playing. He thought it was just related to being older. He also stopped doing a lot of lawn work and other activities. Recently had a home physical where he was found to be out of rhythm and then had an EKG which demonstrated atrial fibrillation. He was started on Eliquis and was seen by Dr. Patty [name]. An echocardiogram and stress test were ordered. Both studies showed low normal LVEF of 50-55% however the nuclear stress test showed no ischemia. He had only mild left atrial enlargement on his echocardiogram. Apparently Dr. Patty [name] had discussed the possibility of a cardioversion with him but had recommended at least 1 month of anticoagulation. Today is his 28th day of Eliquis. He has not missed any doses. EKG shows persistent atrial fibrillation.  I saw Mr. Corey [name] back today in the office for follow-up. He underwent successful cardioversion to sinus rhythm and maintains that today. He notes that he's had some improvement in his energy and was able to mow his lawn without having to stop. He started walking again and is doing about a mile a day. Generally seems to be thriving. Heart rate is  noted to be low in the 50s but he noted that it was in the 50s for most of his life. He denies any chest pain, shortness of breath and as mentioned his energy level has improved. He seems to be tolerating Eliquis without any bleeding complications.  02/16/2016  Corey [name] is doing well without any complaints today. He is maintaining a sinus bradycardia with heart rate of 47 and is asymptomatic. He is not on any AV nodal blocking medications. He is on Eliquis 5 mg twice a day and has no complaints. He denies any bleeding. He is on Lipitor for dyslipidemia but has not had a recent  lipid profile. He denies chest pain or shortness of breath with exertion.  07/17/2016  Corey [name] returns today for follow-up. He is currently in sinus bradycardia with first-degree AV block at 53. He remains on Eliquis for anticoagulation. He was previously on flecainide 50 mg twice daily, but given recent palpitations his medication was increased to 75 mg twice daily and then 100 mg twice daily in the A. fib clinic. He reports good control of her palpitations now although heart rate is low in the 50s. He says with exercise he gets short of breath and has some fatigue which could indicate some chronotropic incompetence.  08/03/2016  Corey [name] returns today for follow-up of his stress test. He was able to exercise for 7 minutes and achieved a max predicted heart rate of 92%. He achieved a heart rate of 85% max predicted after 4.5 minutes and had recovery after 7 minutes. There is no evidence of chronotropic incompetence. The study was negative for ischemia. He reports the shortness of breath has improved significantly. He's been able to do some heavy yard work since then without any difficulty. I suspect his symptoms actually were related to probably atrial mechanical inactivity with his recent A. fib which was slow to recover despite the fact that he is flecainide has restored sinus rhythm.  01/30/2017  Corey [name] returns today for follow-up. He continues to have bradycardia. He had exercise stress testing which showed improvement in heart rate and no chronotropic incompetence. He is walking now up to about 2 miles. He says his heart rate can get up into the 80s. His shortness of breath is still present with marked exertion but goes away fairly quickly. He is maintaining a sinus bradycardia. He denies any bleeding problems on the Eliquis. He said no labs that we can detect this year. His last lipid profile was September 2017 which showed an LDL 69.  02/21/2018  Corey [name] is seen  today for annual follow-up.  He remains asymptomatic and denies any worsening or recurrent atrial fibrillation.  He has persistent bradycardia but says his heart rate goes up with exercise and monitors it with a fit bit.  He does get short of breath only with marked exertion and it improves quickly after resting.  Blood pressures been well controlled.  He denies any adverse bleeding on Eliquis.  He had labs with his PCP at [facility] primary care this year and had a well-controlled lipid profile.  02/12/2019  Mr. Lombardozzi returns today for follow-up of his stress test.  He underwent routine exercise treadmill stress testing due to flecainide for which she takes for atrial flutter.  The treadmill stress test however was abnormal demonstrating significant ST segment depression.  Fortunately, he did not have any symptoms of chest pain or worsening shortness of breath during the study.  A solitary PVC was noted.  He says afterwards his heart rate was elevated somewhat in the 60s to 80s and bounced around during the day but then went back into the 40s or 50s the next day which is his baseline.  Overall he feels well.  He also had recent Mohs surgery for basal cell carcinoma on his nose, unfortunately they did not hold his anticoagulation prior to the procedure but subsequently requested holding it after he had significant bleeding complications.  Fortunately that has resolved.  04/20/2020  Mr. Fieser is seen today for annual follow-up.  He had been seen a couple times over the summer regarding issues with possible A. fib.  He noted some increase in heart rate.  EKG however did not show any breakthrough A. fib.  He has been unfortunately struggling with recurrent depression.  Recently been started on some medication which is a newer medicine and seems to be helping him.  This is good because there were some unfortunate suicidal thoughts to the point where he said that his son had taken away a pistol that he  owned in by his bedside.  Today he denies any suicidal ideations.  Past Medical History:  Diagnosis Date  . Basal cell carcinoma of auricle of right ear   . BPH (benign prostatic hyperplasia)   . DJD (degenerative joint disease)    left  . ED (erectile dysfunction)   . Fatigue   . Hearing loss, sensorineural   . Hx of adenomatous colonic polyps   . Hx of major depression   . Hypertension   . Iron deficiency anemia   . Nephrolithiasis   . Paroxysmal atrial fibrillation (HCC)   . Seasonal allergic rhinitis     Past Surgical History:  Procedure Laterality Date  . CARDIOVERSION N/A 08/13/2015   Procedure: CARDIOVERSION;  Surgeon: Chrystie Nose, MD;  Location: [facility] ENDOSCOPY;  Service: Cardiovascular;  Laterality: N/A;  . CARDIOVERSION N/A 04/04/2016   Procedure: CARDIOVERSION;  Surgeon: Quintella Reichert, MD;  Location: [facility] ENDOSCOPY;  Service: Cardiovascular;  Laterality: N/A;  . CARDIOVERSION N/A 11/12/2017   Procedure: CARDIOVERSION;  Surgeon: Chrystie Nose, MD;  Location: [facility] ENDOSCOPY;  Service: Cardiovascular;  Laterality: N/A;  . CHOLECYSTECTOMY    . colonscopy    . herniorraphy Bilateral    had surgery x2 on the left  and right  . TONSILLECTOMY AND ADENOIDECTOMY       Current Outpatient Medications  Medication Sig Dispense Refill  . ALPRAZolam (XANAX) 0.5 MG tablet Take 0.5 mg by mouth. 1/2 tab in the AM and 1/2 tab in the evening    . amLODipine-benazepril (LOTREL) 5-20 MG capsule Take 1 capsule by mouth daily.    [name] apixaban (ELIQUIS) 5 MG TABS tablet Take 5 mg by mouth 2 (two) times daily.    [name] atorvastatin (LIPITOR) 20 MG tablet Take 20 mg by mouth daily.    . Cariprazine HCl (VRAYLAR) 1.5 & 3 MG CPPK Take 1.5 mg by mouth daily.    . ferrous sulfate 324 MG TBEC Take 324 mg by mouth daily with breakfast.    . flecainide (TAMBOCOR) 100 MG tablet Take 1 tablet by mouth twice daily 90 tablet 3  . sertraline (ZOLOFT) 100 MG tablet Take 100 mg by mouth daily.    . vitamin  B-12 (CYANOCOBALAMIN) 500 MCG tablet Take 500 mcg by mouth daily.    . clonazePAM (KLONOPIN) 0.5 MG tablet Take 0.5 mg by mouth 2 (two) times daily as needed.     No current  facility-administered medications for this visit.    Allergies:   Patient has no known allergies.    Social History:  The patient  reports that he quit smoking about 59 years ago. His smoking use included cigarettes. He has never used smokeless tobacco. He reports that he does not drink alcohol.   Family History:  The patient's family history includes Alcoholism in his daughter; Arrhythmia in his daughter; CAD in his mother; Cancer - Prostate in his brother; Healthy in his daughter and son; Heart attack in his father; Heart disease in his mother. history father died of a heart attack at age 81.  Mother died of postoperative hemorrhage after heart surgery at age 53   ROS:  Pertinent items noted in HPI and remainder of comprehensive ROS otherwise negative.  PHYSICAL EXAM: VS:  BP (!) 150/76   Pulse (!) 45   Temp (!) 95.7 F (35.4 C)   Ht 6' (1.829 m)   Wt 203 lb 3.2 oz (92.2 kg)   SpO2 98%   BMI 27.56 kg/m  , BMI Body mass index is 27.56 kg/m. General appearance: alert and no distress Neck: no carotid bruit, no JVD and thyroid not enlarged, symmetric, no tenderness/mass/nodules Lungs: clear to auscultation bilaterally Heart: regular bradycardia Abdomen: soft, non-tender; bowel sounds normal; no masses,  no organomegaly Extremities: extremities normal, atraumatic, no cyanosis or edema Pulses: 2+ and symmetric Skin: Skin color, texture, turgor normal. No rashes or lesions Neurologic: Grossly normal Psych: pleasant  EKG:  Sinus bradycardia first-degree AV block at 45, nonspecific IVCD, QTC 454 ms-personally reviewed  Recent Labs: 11/28/2019: ALT 17 12/18/2019: BUN 12; Creatinine, Ser 0.85; Hemoglobin 15.2; Platelets 306; Potassium 4.4; Sodium 135    Lipid Panel    Component Value Date/Time   CHOL 148  01/31/2017 0849   TRIG 70 01/31/2017 0849   HDL 69 01/31/2017 0849   CHOLHDL 2.1 01/31/2017 0849   CHOLHDL 2.1 02/16/2016 0826   VLDL 17 02/16/2016 0826   LDLCALC 65 01/31/2017 0849      Wt Readings from Last 3 Encounters:  04/20/20 203 lb 3.2 oz (92.2 kg)  01/09/20 178 lb 6.4 oz (80.9 kg)  12/26/19 181 lb (82.1 kg)    ASSESSMENT AND PLAN:  1.  Atrial fibrillation - CHADSVASC score of 3 on Eliquis 2.  Essential hypertension 3.  History of hypercholesterolemia 4.  DOE -abnormal exercise treadmill stress test 5.  Depression with suicidal ideation  PLAN:    Mr. Kempe seems to be doing well with regards to A. fib without any recent recurrence on flecainide.  His QTC is stable.  He has been started on some recent psychiatric medications due to issues with depression that was severe including suicidal ideation.  Fortunately that has improved.  Blood pressure is very good today.  He denies any chest pain.  We will need to consider repeat stress testing in the near future given the flecainide.  He has had some significant weight loss but gained that back.  He will need to continue to monitor that.  Follow-up annually or sooner as necessary.  Chrystie Nose, MD, [facility], FACP  [place]  [facility]  Medical Director of the [facility] Lipid Disorders &  Cardiovascular Risk Reduction Clinic Diplomate of the American Board of Clinical Lipidology Attending Cardiologist  Direct Dial: [phone]  Fax: [phone]  Website:  [email or web address]  04/20/2020 10:41 AM

## 2020-06-16 DIAGNOSIS — E7800 Pure hypercholesterolemia, unspecified: Secondary | ICD-10-CM | POA: Diagnosis not present

## 2020-06-16 DIAGNOSIS — I25119 Atherosclerotic heart disease of native coronary artery with unspecified angina pectoris: Secondary | ICD-10-CM | POA: Diagnosis not present

## 2020-06-16 DIAGNOSIS — D509 Iron deficiency anemia, unspecified: Secondary | ICD-10-CM | POA: Diagnosis not present

## 2020-06-16 DIAGNOSIS — I480 Paroxysmal atrial fibrillation: Secondary | ICD-10-CM | POA: Diagnosis not present

## 2020-06-16 DIAGNOSIS — M179 Osteoarthritis of knee, unspecified: Secondary | ICD-10-CM | POA: Diagnosis not present

## 2020-06-16 DIAGNOSIS — I10 Essential (primary) hypertension: Secondary | ICD-10-CM | POA: Diagnosis not present

## 2020-07-13 DIAGNOSIS — I480 Paroxysmal atrial fibrillation: Secondary | ICD-10-CM | POA: Diagnosis not present

## 2020-07-13 DIAGNOSIS — I25119 Atherosclerotic heart disease of native coronary artery with unspecified angina pectoris: Secondary | ICD-10-CM | POA: Diagnosis not present

## 2020-07-13 DIAGNOSIS — D509 Iron deficiency anemia, unspecified: Secondary | ICD-10-CM | POA: Diagnosis not present

## 2020-07-13 DIAGNOSIS — E7800 Pure hypercholesterolemia, unspecified: Secondary | ICD-10-CM | POA: Diagnosis not present

## 2020-07-13 DIAGNOSIS — I10 Essential (primary) hypertension: Secondary | ICD-10-CM | POA: Diagnosis not present

## 2020-07-13 DIAGNOSIS — M179 Osteoarthritis of knee, unspecified: Secondary | ICD-10-CM | POA: Diagnosis not present

## 2020-08-19 DIAGNOSIS — E7800 Pure hypercholesterolemia, unspecified: Secondary | ICD-10-CM | POA: Diagnosis not present

## 2020-08-19 DIAGNOSIS — I25119 Atherosclerotic heart disease of native coronary artery with unspecified angina pectoris: Secondary | ICD-10-CM | POA: Diagnosis not present

## 2020-08-19 DIAGNOSIS — I10 Essential (primary) hypertension: Secondary | ICD-10-CM | POA: Diagnosis not present

## 2020-08-19 DIAGNOSIS — D509 Iron deficiency anemia, unspecified: Secondary | ICD-10-CM | POA: Diagnosis not present

## 2020-08-19 DIAGNOSIS — M179 Osteoarthritis of knee, unspecified: Secondary | ICD-10-CM | POA: Diagnosis not present

## 2020-08-19 DIAGNOSIS — I480 Paroxysmal atrial fibrillation: Secondary | ICD-10-CM | POA: Diagnosis not present

## 2020-09-07 DIAGNOSIS — Z0000 Encounter for general adult medical examination without abnormal findings: Secondary | ICD-10-CM | POA: Diagnosis not present

## 2020-09-07 DIAGNOSIS — D509 Iron deficiency anemia, unspecified: Secondary | ICD-10-CM | POA: Diagnosis not present

## 2020-09-07 DIAGNOSIS — I480 Paroxysmal atrial fibrillation: Secondary | ICD-10-CM | POA: Diagnosis not present

## 2020-09-07 DIAGNOSIS — I2510 Atherosclerotic heart disease of native coronary artery without angina pectoris: Secondary | ICD-10-CM | POA: Diagnosis not present

## 2020-09-07 DIAGNOSIS — R7301 Impaired fasting glucose: Secondary | ICD-10-CM | POA: Diagnosis not present

## 2020-09-07 DIAGNOSIS — E7800 Pure hypercholesterolemia, unspecified: Secondary | ICD-10-CM | POA: Diagnosis not present

## 2020-09-07 DIAGNOSIS — D6869 Other thrombophilia: Secondary | ICD-10-CM | POA: Diagnosis not present

## 2020-09-07 DIAGNOSIS — I10 Essential (primary) hypertension: Secondary | ICD-10-CM | POA: Diagnosis not present

## 2020-09-14 IMAGING — DX DG CHEST 2V
2 series · 2 of 2 positions shown · non-contrast
Comparison: PA and lateral chest 12/18/2019.

CLINICAL DATA: Worsening shortness of breath.

EXAM:
CHEST - 2 VIEW

[dg chest 2 view (1 of 2)]
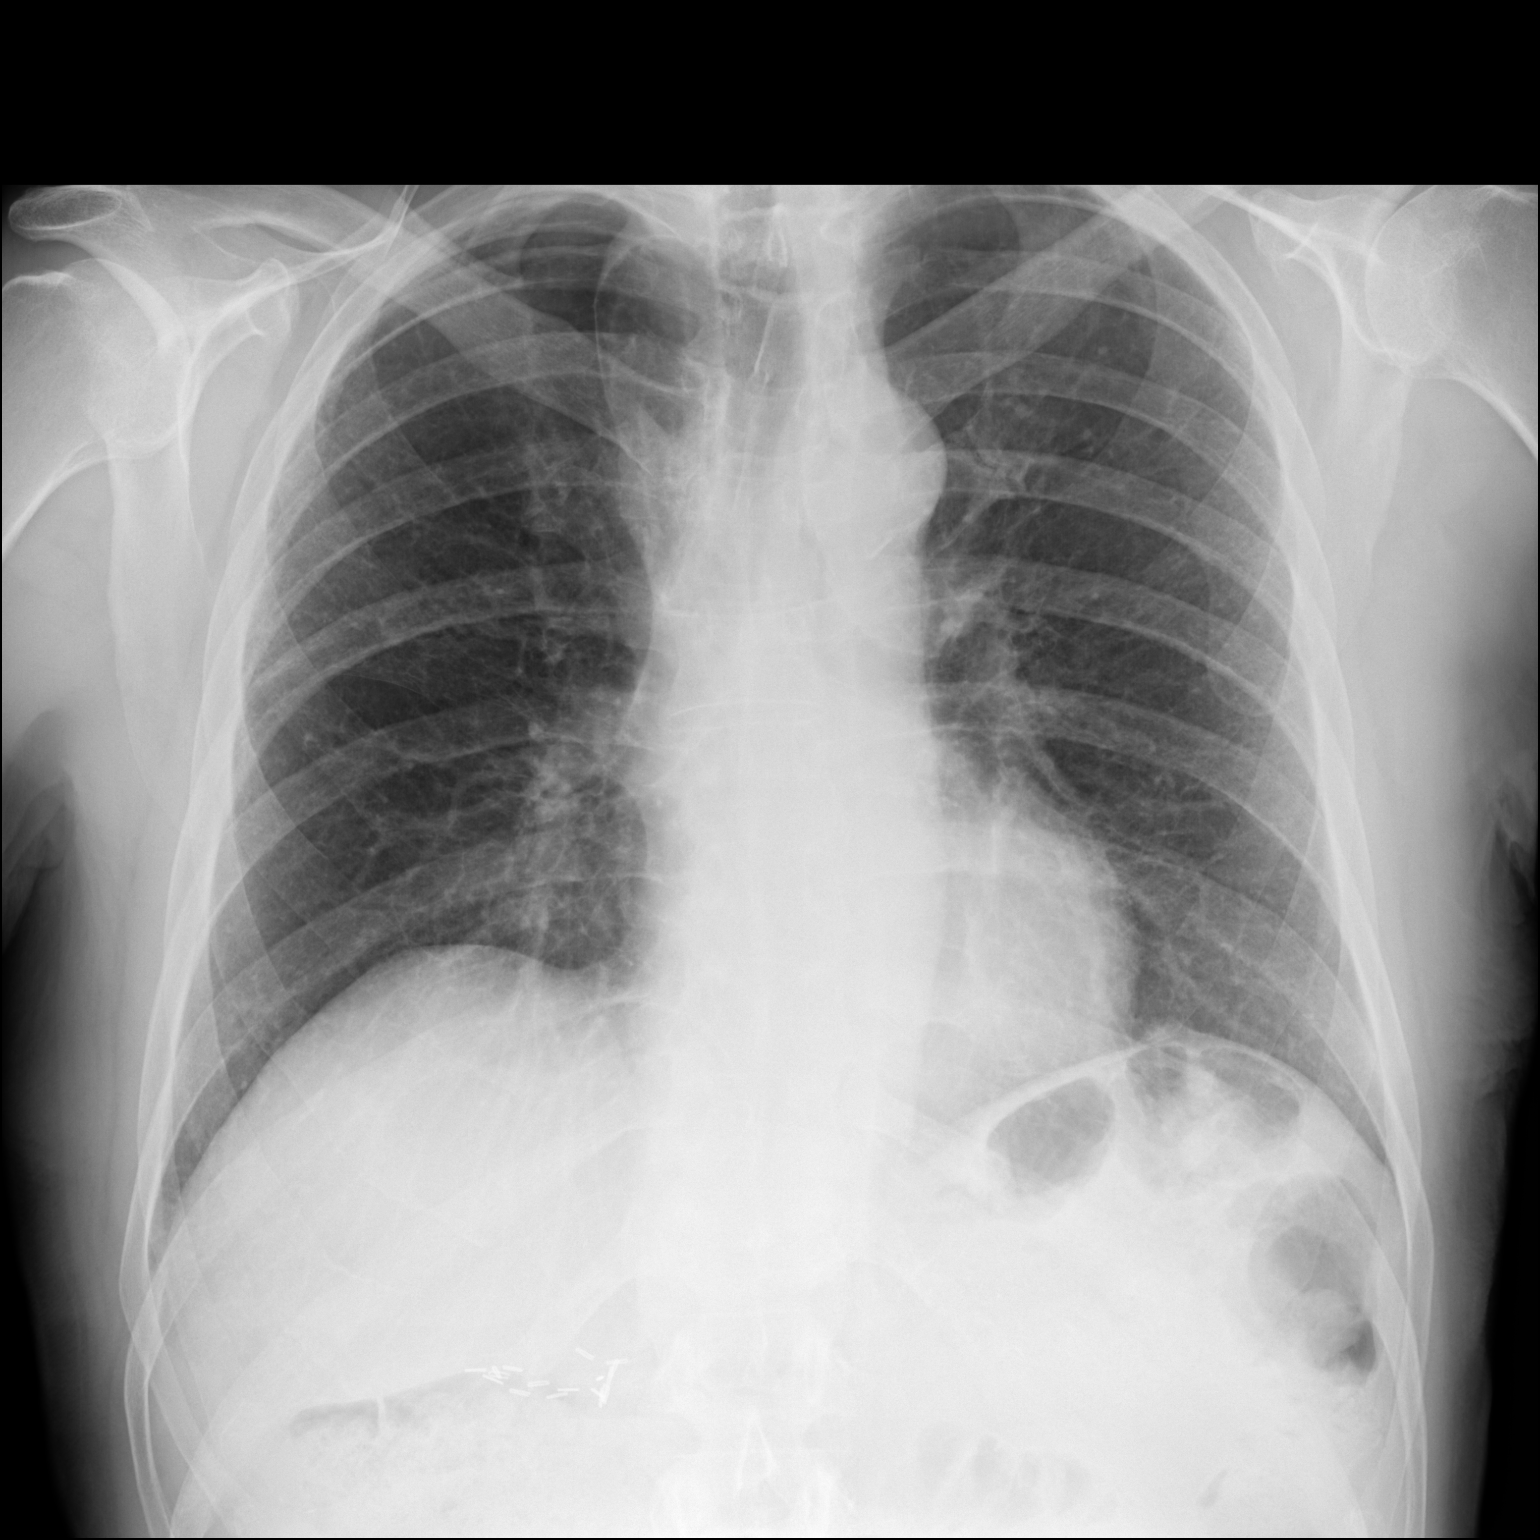

[dg chest 2 view (2 of 2)]
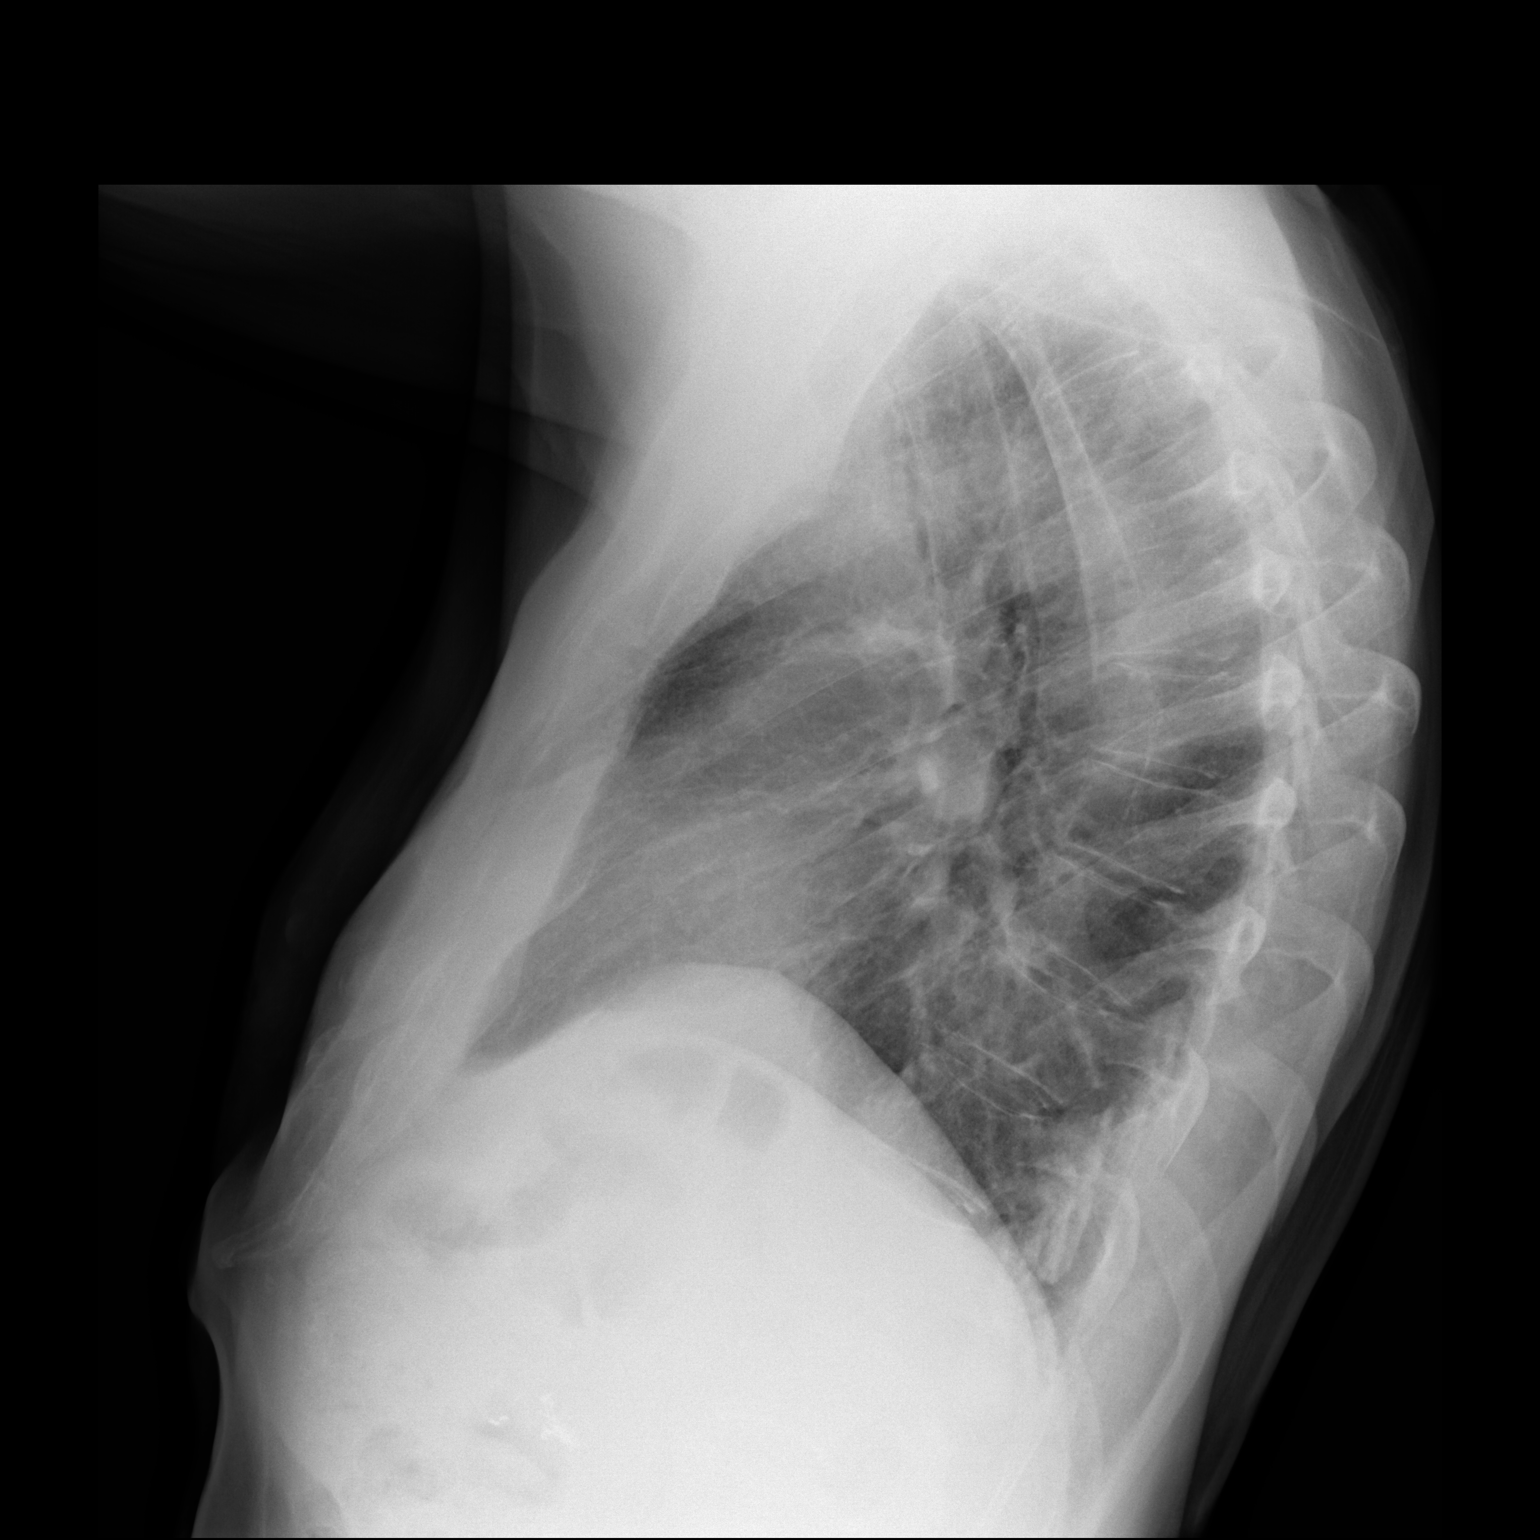

[2 of 2 positions shown; findings below may reference images not displayed]

FINDINGS: Lungs clear. Heart size normal. No pneumothorax or pleural effusion.
Azygos fissure noted. No acute or focal bony abnormality.
IMPRESSION: Negative chest.

## 2020-09-16 ENCOUNTER — Other Ambulatory Visit (HOSPITAL_COMMUNITY): Payer: Self-pay | Admitting: Internal Medicine

## 2020-09-20 DIAGNOSIS — I10 Essential (primary) hypertension: Secondary | ICD-10-CM | POA: Diagnosis not present

## 2020-09-20 DIAGNOSIS — M179 Osteoarthritis of knee, unspecified: Secondary | ICD-10-CM | POA: Diagnosis not present

## 2020-09-20 DIAGNOSIS — D509 Iron deficiency anemia, unspecified: Secondary | ICD-10-CM | POA: Diagnosis not present

## 2020-09-20 DIAGNOSIS — I480 Paroxysmal atrial fibrillation: Secondary | ICD-10-CM | POA: Diagnosis not present

## 2020-09-20 DIAGNOSIS — I25119 Atherosclerotic heart disease of native coronary artery with unspecified angina pectoris: Secondary | ICD-10-CM | POA: Diagnosis not present

## 2020-09-20 DIAGNOSIS — E7800 Pure hypercholesterolemia, unspecified: Secondary | ICD-10-CM | POA: Diagnosis not present

## 2020-09-20 DIAGNOSIS — I2510 Atherosclerotic heart disease of native coronary artery without angina pectoris: Secondary | ICD-10-CM | POA: Diagnosis not present

## 2020-11-02 ENCOUNTER — Other Ambulatory Visit (HOSPITAL_COMMUNITY): Payer: Self-pay | Admitting: Internal Medicine

## 2020-11-23 DIAGNOSIS — I25119 Atherosclerotic heart disease of native coronary artery with unspecified angina pectoris: Secondary | ICD-10-CM | POA: Diagnosis not present

## 2020-11-23 DIAGNOSIS — I2510 Atherosclerotic heart disease of native coronary artery without angina pectoris: Secondary | ICD-10-CM | POA: Diagnosis not present

## 2020-11-23 DIAGNOSIS — E7800 Pure hypercholesterolemia, unspecified: Secondary | ICD-10-CM | POA: Diagnosis not present

## 2020-11-23 DIAGNOSIS — D509 Iron deficiency anemia, unspecified: Secondary | ICD-10-CM | POA: Diagnosis not present

## 2020-11-23 DIAGNOSIS — I10 Essential (primary) hypertension: Secondary | ICD-10-CM | POA: Diagnosis not present

## 2020-11-23 DIAGNOSIS — I480 Paroxysmal atrial fibrillation: Secondary | ICD-10-CM | POA: Diagnosis not present

## 2020-11-23 DIAGNOSIS — M179 Osteoarthritis of knee, unspecified: Secondary | ICD-10-CM | POA: Diagnosis not present

## 2020-12-15 ENCOUNTER — Other Ambulatory Visit (HOSPITAL_COMMUNITY): Payer: Self-pay | Admitting: Internal Medicine

## 2020-12-30 DIAGNOSIS — I25119 Atherosclerotic heart disease of native coronary artery with unspecified angina pectoris: Secondary | ICD-10-CM | POA: Diagnosis not present

## 2020-12-30 DIAGNOSIS — I2510 Atherosclerotic heart disease of native coronary artery without angina pectoris: Secondary | ICD-10-CM | POA: Diagnosis not present

## 2020-12-30 DIAGNOSIS — M179 Osteoarthritis of knee, unspecified: Secondary | ICD-10-CM | POA: Diagnosis not present

## 2020-12-30 DIAGNOSIS — I480 Paroxysmal atrial fibrillation: Secondary | ICD-10-CM | POA: Diagnosis not present

## 2020-12-30 DIAGNOSIS — D509 Iron deficiency anemia, unspecified: Secondary | ICD-10-CM | POA: Diagnosis not present

## 2020-12-30 DIAGNOSIS — I10 Essential (primary) hypertension: Secondary | ICD-10-CM | POA: Diagnosis not present

## 2020-12-30 DIAGNOSIS — E7800 Pure hypercholesterolemia, unspecified: Secondary | ICD-10-CM | POA: Diagnosis not present

## 2021-01-29 ENCOUNTER — Other Ambulatory Visit (HOSPITAL_COMMUNITY): Payer: Self-pay | Admitting: Internal Medicine

## 2021-02-14 DIAGNOSIS — Z23 Encounter for immunization: Secondary | ICD-10-CM | POA: Diagnosis not present

## 2021-02-14 DIAGNOSIS — I480 Paroxysmal atrial fibrillation: Secondary | ICD-10-CM | POA: Diagnosis not present

## 2021-02-14 DIAGNOSIS — R5383 Other fatigue: Secondary | ICD-10-CM | POA: Diagnosis not present

## 2021-02-14 DIAGNOSIS — I10 Essential (primary) hypertension: Secondary | ICD-10-CM | POA: Diagnosis not present

## 2021-03-08 DIAGNOSIS — I2510 Atherosclerotic heart disease of native coronary artery without angina pectoris: Secondary | ICD-10-CM | POA: Diagnosis not present

## 2021-03-08 DIAGNOSIS — D509 Iron deficiency anemia, unspecified: Secondary | ICD-10-CM | POA: Diagnosis not present

## 2021-03-08 DIAGNOSIS — E7800 Pure hypercholesterolemia, unspecified: Secondary | ICD-10-CM | POA: Diagnosis not present

## 2021-03-08 DIAGNOSIS — M179 Osteoarthritis of knee, unspecified: Secondary | ICD-10-CM | POA: Diagnosis not present

## 2021-03-08 DIAGNOSIS — I10 Essential (primary) hypertension: Secondary | ICD-10-CM | POA: Diagnosis not present

## 2021-03-08 DIAGNOSIS — I480 Paroxysmal atrial fibrillation: Secondary | ICD-10-CM | POA: Diagnosis not present

## 2021-03-08 DIAGNOSIS — I25119 Atherosclerotic heart disease of native coronary artery with unspecified angina pectoris: Secondary | ICD-10-CM | POA: Diagnosis not present

## 2021-03-15 ENCOUNTER — Other Ambulatory Visit (HOSPITAL_COMMUNITY): Payer: Self-pay | Admitting: Internal Medicine

## 2021-05-23 DIAGNOSIS — I480 Paroxysmal atrial fibrillation: Secondary | ICD-10-CM | POA: Diagnosis not present

## 2021-05-23 DIAGNOSIS — E7800 Pure hypercholesterolemia, unspecified: Secondary | ICD-10-CM | POA: Diagnosis not present

## 2021-05-23 DIAGNOSIS — I10 Essential (primary) hypertension: Secondary | ICD-10-CM | POA: Diagnosis not present

## 2021-06-16 ENCOUNTER — Other Ambulatory Visit (HOSPITAL_COMMUNITY): Payer: Self-pay | Admitting: Internal Medicine

## 2021-06-17 ENCOUNTER — Ambulatory Visit: Payer: Medicare Other | Admitting: Internal Medicine

## 2021-06-17 ENCOUNTER — Other Ambulatory Visit: Payer: Self-pay

## 2021-06-17 ENCOUNTER — Encounter: Payer: Self-pay | Admitting: Internal Medicine

## 2021-06-17 VITALS — BP 122/80 | HR 54 | Resp 20 | Ht 72.0 in | Wt 206.0 lb

## 2021-06-17 DIAGNOSIS — I480 Paroxysmal atrial fibrillation: Secondary | ICD-10-CM | POA: Diagnosis not present

## 2021-06-17 DIAGNOSIS — Z7901 Long term (current) use of anticoagulants: Secondary | ICD-10-CM

## 2021-06-17 DIAGNOSIS — Z79899 Other long term (current) drug therapy: Secondary | ICD-10-CM

## 2021-06-17 DIAGNOSIS — R0609 Other forms of dyspnea: Secondary | ICD-10-CM | POA: Diagnosis not present

## 2021-06-17 DIAGNOSIS — I10 Essential (primary) hypertension: Secondary | ICD-10-CM | POA: Diagnosis not present

## 2021-06-17 DIAGNOSIS — Z5181 Encounter for therapeutic drug level monitoring: Secondary | ICD-10-CM

## 2021-06-17 NOTE — Progress Notes (Signed)
Cardiology Office Note  Date:  06/17/2021   ID:  [name], [name] [birth year], MRN [id number]  PCP:  Corey Funk, MD   CC: Follow-up stress test   History of Present Illness: Corey [name] is a 87 y.o. male who presents for cardiology evaluation.  He is seen by me for the first time today.  He is seen at the request of Dr. Kirby [name].  The patient is being seen because of atrial fibrillation of unknown duration.  The patient was seen for a physical in November 2016 by Dr. Valentina [name].  No EKG was done at that time according to the patient, but no mention of atrial fibrillation or irregular pulse wasn't mentioned.  Subsequently had a nurse practitioner house call about a week ago who performed a physical examination on him at his home.  She noted an irregular pulse.  Corey [name]'s office was notified and he was seen in Dr. Jone [name] office on 07/14/15 by Corey Rogue, NP, who obtained an electrocardiogram and confirmed the presence of atrial fibrillation. The patient himself is really not aware of his heart rate.  He does not know how long he may have been in the atrial fibrillation.  He began experiencing fatigue several years ago and because of that stop playing golf about 2 years ago.  He would give out after playing about 12 holes of golf.  He has not had any symptoms of congestive heart failure.  He sleeps on one pillow.  He does not have any difficulty climbing stairs.  He has had a sense of vague chest tightness at times.  There is no radiation to the arms.  The past 6 months the patient has also had occasional night sweats which are new. The patient has had some aching in the muscles of his legs.  He is on statin therapy. He has not had any TIA symptoms.  He has not had any dizziness or syncope.  Corey [name] is seen in the office today is a new patient to me however he was recently seen by Corey [name]. Unfortunately Corey [name] just retired and as Mr. Corey [name]  is my patient, he is kindly requested my services. This or Corey [name] says over the past year he's had some worsening fatigue and decreased energy levels. In fact she's given up a lot of activities including golf which he enjoyed playing. He thought it was just related to being older. He also stopped doing a lot of lawn work and other activities. Recently had a home physical where he was found to be out of rhythm and then had an EKG which demonstrated atrial fibrillation. He was started on Eliquis and was seen by Corey [name]. An echocardiogram and stress test were ordered. Both studies showed low normal LVEF of 50-55% however the nuclear stress test showed no ischemia. He had only mild left atrial enlargement on his echocardiogram. Apparently Corey [name] had discussed the possibility of a cardioversion with him but had recommended at least 1 month of anticoagulation. Today is his 28th day of Eliquis. He has not missed any doses. EKG shows persistent atrial fibrillation.  I saw Corey [name] back today in the office for follow-up. He underwent successful cardioversion to sinus rhythm and maintains that today. He notes that he's had some improvement in his energy and was able to mow his lawn without having to stop. He started walking again and is doing about a mile a day. Generally seems to be thriving. Heart rate is  noted to be low in the 50s but he noted that it was in the 50s for most of his life. He denies any chest pain, shortness of breath and as mentioned his energy level has improved. He seems to be tolerating Eliquis without any bleeding complications.  02/16/2016  Corey [name] is doing well without any complaints today. He is maintaining a sinus bradycardia with heart rate of 47 and is asymptomatic. He is not on any AV nodal blocking medications. He is on Eliquis 5 mg twice a day and has no complaints. He denies any bleeding. He is on Lipitor for dyslipidemia but has not had a recent lipid  profile. He denies chest pain or shortness of breath with exertion.  07/17/2016  Corey [name] returns today for follow-up. He is currently in sinus bradycardia with first-degree AV block at 53. He remains on Eliquis for anticoagulation. He was previously on flecainide 50 mg twice daily, but given recent palpitations his medication was increased to 75 mg twice daily and then 100 mg twice daily in the A. fib clinic. He reports good control of her palpitations now although heart rate is low in the 50s. He says with exercise he gets short of breath and has some fatigue which could indicate some chronotropic incompetence.  08/03/2016  Corey [name] returns today for follow-up of his stress test. He was able to exercise for 7 minutes and achieved a max predicted heart rate of 92%. He achieved a heart rate of 85% max predicted after 4.5 minutes and had recovery after 7 minutes. There is no evidence of chronotropic incompetence. The study was negative for ischemia. He reports the shortness of breath has improved significantly. He's been able to do some heavy yard work since then without any difficulty. I suspect his symptoms actually were related to probably atrial mechanical inactivity with his recent A. fib which was slow to recover despite the fact that he is flecainide has restored sinus rhythm.  01/30/2017  Corey [name] returns today for follow-up. He continues to have bradycardia. He had exercise stress testing which showed improvement in heart rate and no chronotropic incompetence. He is walking now up to about 2 miles. He says his heart rate can get up into the 80s. His shortness of breath is still present with marked exertion but goes away fairly quickly. He is maintaining a sinus bradycardia. He denies any bleeding problems on the Eliquis. He said no labs that we can detect this year. His last lipid profile was September 2017 which showed an LDL 69.  02/21/2018  Corey [name] is seen today for  annual follow-up.  He remains asymptomatic and denies any worsening or recurrent atrial fibrillation.  He has persistent bradycardia but says his heart rate goes up with exercise and monitors it with a fit bit.  He does get short of breath only with marked exertion and it improves quickly after resting.  Blood pressures been well controlled.  He denies any adverse bleeding on Eliquis.  He had labs with his PCP at [facility] primary care this year and had a well-controlled lipid profile.  02/12/2019  Corey [name] returns today for follow-up of his stress test.  He underwent routine exercise treadmill stress testing due to flecainide for which she takes for atrial flutter.  The treadmill stress test however was abnormal demonstrating significant ST segment depression.  Fortunately, he did not have any symptoms of chest pain or worsening shortness of breath during the study.  A solitary PVC was noted.  He says afterwards his heart rate was elevated somewhat in the 60s to 80s and bounced around during the day but then went back into the 40s or 50s the next day which is his baseline.  Overall he feels well.  He also had recent Mohs surgery for basal cell carcinoma on his nose, unfortunately they did not hold his anticoagulation prior to the procedure but subsequently requested holding it after he had significant bleeding complications.  Fortunately that has resolved.  04/20/2020  Corey [name] is seen today for annual follow-up.  He had been seen a couple times over the summer regarding issues with possible A. fib.  He noted some increase in heart rate.  EKG however did not show any breakthrough A. fib.  He has been unfortunately struggling with recurrent depression.  Recently been started on some medication which is a newer medicine and seems to be helping him.  This is good because there were some unfortunate suicidal thoughts to the point where he said that his son had taken away a pistol that he owned in by  his bedside.  Today he denies any suicidal ideations.  06/17/2021  Corey [name] returns for follow-up.  More recently he has noted some shortness of breath and fatigue with exercise.  Over the summer it was worse when trying to mow the lawn.  He also noted it seems to be worse when he wakes up in the morning but gradually improves somewhat during the day.  He also has symptoms when walking up steps or carrying things.  He denies any frank chest pain.  He did undergo treadmill stress testing in 2020 which showed some mild ST segment depressions and was considered abnormal.  We did not do any follow-up testing at the time as he was asymptomatic.  This was mainly to assess EKG changes on flecainide.  Now it seems like he may be symptomatic.  Past Medical History:  Diagnosis Date   Basal cell carcinoma of auricle of right ear    BPH (benign prostatic hyperplasia)    DJD (degenerative joint disease)    left   ED (erectile dysfunction)    Fatigue    Hearing loss, sensorineural    Hx of adenomatous colonic polyps    Hx of major depression    Hypertension    Iron deficiency anemia    Nephrolithiasis    Paroxysmal atrial fibrillation (HCC)    Seasonal allergic rhinitis     Past Surgical History:  Procedure Laterality Date   CARDIOVERSION N/A 08/13/2015   Procedure: CARDIOVERSION;  Surgeon: Chrystie Nose, MD;  Location: [facility] ENDOSCOPY;  Service: Cardiovascular;  Laterality: N/A;   CARDIOVERSION N/A 04/04/2016   Procedure: CARDIOVERSION;  Surgeon: Quintella Reichert, MD;  Location: [facility] ENDOSCOPY;  Service: Cardiovascular;  Laterality: N/A;   CARDIOVERSION N/A 11/12/2017   Procedure: CARDIOVERSION;  Surgeon: Chrystie Nose, MD;  Location: [facility] ENDOSCOPY;  Service: Cardiovascular;  Laterality: N/A;   CHOLECYSTECTOMY     colonscopy     herniorraphy Bilateral    had surgery x2 on the left  and right   TONSILLECTOMY AND ADENOIDECTOMY       Current Outpatient Medications  Medication Sig Dispense  Refill   apixaban (ELIQUIS) 5 MG TABS tablet Take 5 mg by mouth 2 (two) times daily.     atorvastatin (LIPITOR) 20 MG tablet Take 20 mg by mouth daily.     buPROPion (WELLBUTRIN SR) 150 MG 12 hr tablet Take 150 mg by mouth daily.  busPIRone (BUSPAR) 7.5 MG tablet Take 7.5 mg by mouth 3 (three) times daily.     Cariprazine HCl (VRAYLAR) 1.5 & 3 MG CPPK Take 1.5 mg by mouth daily.     clonazePAM (KLONOPIN) 0.5 MG tablet Take 0.5 mg by mouth 2 (two) times daily as needed.     ferrous sulfate 324 MG TBEC Take 324 mg by mouth daily with breakfast.     flecainide (TAMBOCOR) 100 MG tablet Take 1 tablet by mouth twice daily 90 tablet 0   LAGEVRIO 200 MG CAPS capsule SMARTSIG:4 Capsule(s) By Mouth Every 12 Hours     LORazepam (ATIVAN) 0.5 MG tablet Take 0.5 mg by mouth daily.     mirtazapine (REMERON) 30 MG tablet Take 30 mg by mouth daily.     sertraline (ZOLOFT) 100 MG tablet Take 100 mg by mouth daily.     vitamin B-12 (CYANOCOBALAMIN) 500 MCG tablet Take 500 mcg by mouth daily.     No current facility-administered medications for this visit.    Allergies:   Patient has no known allergies.    Social History:  The patient  reports that he quit smoking about 60 years ago. His smoking use included cigarettes. He has never used smokeless tobacco. He reports that he does not drink alcohol.   Family History:  The patient's family history includes Alcoholism in his daughter; Arrhythmia in his daughter; CAD in his mother; Cancer - Prostate in his brother; Healthy in his daughter and son; Heart attack in his father; Heart disease in his mother. history father died of a heart attack at age 62.  Mother died of postoperative hemorrhage after heart surgery at age 85   ROS:  Pertinent items noted in HPI and remainder of comprehensive ROS otherwise negative.  PHYSICAL EXAM: VS:  BP 122/80 (BP Location: Left Arm, Patient Position: Sitting, Cuff Size: Normal)    Pulse (!) 54    Resp 20    Ht 6' (1.829 m)     Wt 206 lb (93.4 kg)    SpO2 96%    BMI 27.94 kg/m  , BMI Body mass index is 27.94 kg/m. General appearance: alert and no distress Neck: no carotid bruit, no JVD and thyroid not enlarged, symmetric, no tenderness/mass/nodules Lungs: clear to auscultation bilaterally Heart: regular bradycardia Abdomen: soft, non-tender; bowel sounds normal; no masses,  no organomegaly Extremities: extremities normal, atraumatic, no cyanosis or edema Pulses: 2+ and symmetric Skin: Skin color, texture, turgor normal. No rashes or lesions Neurologic: Grossly normal Psych: pleasant  EKG:  Sinus bradycardia first-degree AV block, bifascicular block at 54-personally reviewed  Recent Labs: No results found for requested labs within last 8760 hours.    Lipid Panel    Component Value Date/Time   CHOL 148 01/31/2017 0849   TRIG 70 01/31/2017 0849   HDL 69 01/31/2017 0849   CHOLHDL 2.1 01/31/2017 0849   CHOLHDL 2.1 02/16/2016 0826   VLDL 17 02/16/2016 0826   LDLCALC 65 01/31/2017 0849      Wt Readings from Last 3 Encounters:  06/17/21 206 lb (93.4 kg)  04/20/20 203 lb 3.2 oz (92.2 kg)  01/09/20 178 lb 6.4 oz (80.9 kg)    ASSESSMENT AND PLAN:  1.  Atrial fibrillation - CHADSVASC score of 3 on Eliquis 2.  Essential hypertension 3.  History of hypercholesterolemia 4.  DOE -abnormal exercise treadmill stress test 5.  Depression with suicidal ideation  PLAN:    Mr. Licursi has had some progressive shortness of breath  and decreased exercise tolerance.  He did have a mildly abnormal ETT in 2020.  We did not follow this up at the time because he was asymptomatic as it was ordered for flecainide therapy.  Since he has had some more recent symptoms I like to repeat an exercise treadmill test but with nuclear perfusion imaging to determine if there is indeed any evidence for ischemia.  He should perform the exercise test on flecainide.  He notes his heart rate does go up with exertion and improves at  rest.  Follow-up annually or sooner as necessary.  Chrystie Nose, MD, [facility], FACP  [place]   [facility]  Medical Director of the Advanced Lipid Disorders &  Cardiovascular Risk Reduction Clinic Diplomate of the American Board of Clinical Lipidology Attending Cardiologist  Direct Dial: [phone]   Fax: [phone]  Website:  [email or web address]  06/17/2021 10:33 AM

## 2021-06-17 NOTE — Patient Instructions (Addendum)
Medication Instructions:  NO CHANGES  *If you need a refill on your cardiac medications before your next appointment, please call your pharmacy*   Testing/Procedures: Dr. Debara Pickett has ordered a Myocardial Perfusion Imaging Study. This will be scheduled at [address] [place] [address] [employer]    The test will take approximately 3 to 4 hours to complete; you may bring reading material.  If someone comes with you to your appointment, they will need to remain in the main lobby due to limited space in the testing area. **If you are pregnant or breastfeeding, please notify the nuclear lab prior to your appointment**   You will continue flecainide for this test. Do not hold.    How to prepare for your Myocardial Perfusion Test: Do not eat or drink 3 hours prior to your test, except you may have water. Do not consume products containing caffeine (regular or decaffeinated) 12 hours prior to your test. (ex: coffee, chocolate, sodas, tea). Do wear comfortable clothes (no dresses or overalls) and walking shoes, tennis shoes preferred (No heels or open toe shoes are allowed). Do NOT wear cologne, perfume, aftershave, or lotions (deodorant is allowed). If these instructions are not followed, your test will have to be rescheduled.    Follow-Up: At [facility], you and your health needs are our priority.  As part of our continuing mission to provide you with exceptional heart care, we have created designated Provider Care Teams.  These Care Teams include your primary Cardiologist (physician) and Advanced Practice Providers (APPs -  Physician Assistants and Nurse Practitioners) who all work together to provide you with the care you need, when you need it.  We recommend signing up for the patient portal called "MyChart".  Sign up information is provided on this After Visit Summary.  MyChart is used to connect with patients for Virtual Visits (Telemedicine).  Patients are able  to view lab/test results, encounter notes, upcoming appointments, etc.  Non-urgent messages can be sent to your provider as well.   To learn more about what you can do with MyChart, go to [email or web address].    Your next appointment:   12 months with Dr. Debara Pickett

## 2021-06-20 ENCOUNTER — Telehealth (HOSPITAL_COMMUNITY): Payer: Self-pay | Admitting: *Deleted

## 2021-06-20 NOTE — Telephone Encounter (Signed)
Patient given detailed instructions per Myocardial Perfusion Study Information Sheet for the test on 06/27/21 at 10:15. Patient notified to arrive 15 minutes early and that it is imperative to arrive on time for appointment to keep from having the test rescheduled.  If you need to cancel or reschedule your appointment, please call the office within 24 hours of your appointment. . Patient verbalized understanding.Corey [name]

## 2021-06-27 ENCOUNTER — Ambulatory Visit (HOSPITAL_COMMUNITY): Payer: Medicare Other | Attending: Cardiology

## 2021-06-27 ENCOUNTER — Other Ambulatory Visit: Payer: Self-pay

## 2021-06-27 DIAGNOSIS — Z5181 Encounter for therapeutic drug level monitoring: Secondary | ICD-10-CM | POA: Diagnosis not present

## 2021-06-27 DIAGNOSIS — I480 Paroxysmal atrial fibrillation: Secondary | ICD-10-CM | POA: Insufficient documentation

## 2021-06-27 DIAGNOSIS — Z79899 Other long term (current) drug therapy: Secondary | ICD-10-CM | POA: Insufficient documentation

## 2021-06-27 DIAGNOSIS — I10 Essential (primary) hypertension: Secondary | ICD-10-CM | POA: Insufficient documentation

## 2021-06-27 DIAGNOSIS — Z7901 Long term (current) use of anticoagulants: Secondary | ICD-10-CM | POA: Insufficient documentation

## 2021-06-27 DIAGNOSIS — R0609 Other forms of dyspnea: Secondary | ICD-10-CM | POA: Insufficient documentation

## 2021-06-27 LAB — MYOCARDIAL PERFUSION IMAGING
Angina Index: 0
Duke Treadmill Score: 5
Estimated workload: 7
Exercise duration (min): 5 min
Exercise duration (sec): 16 s
LV dias vol: 88 mL (ref 62–150)
LV sys vol: 43 mL
MPHR: 136 {beats}/min
Nuc Stress EF: 51 %
Peak HR: 129 {beats}/min
Percent HR: 94 %
RPE: 18
Rest HR: 58 {beats}/min
Rest Nuclear Isotope Dose: 10.9 mCi
SDS: 0
SRS: 0
SSS: 0
ST Depression (mm): 0 mm
Stress Nuclear Isotope Dose: 32.3 mCi
TID: 0.91

## 2021-06-27 MED ORDER — TECHNETIUM TC 99M TETROFOSMIN IV KIT
32.3000 | PACK | Freq: Once | INTRAVENOUS | Status: AC | PRN
  Administered 2021-06-27: 32.3 via INTRAVENOUS
  Filled 2021-06-27: qty 33

## 2021-06-27 MED ORDER — TECHNETIUM TC 99M TETROFOSMIN IV KIT
10.9000 | PACK | Freq: Once | INTRAVENOUS | Status: AC | PRN
  Administered 2021-06-27: 10.9 via INTRAVENOUS
  Filled 2021-06-27: qty 11

## 2021-07-25 ENCOUNTER — Other Ambulatory Visit (HOSPITAL_COMMUNITY): Payer: Self-pay | Admitting: Internal Medicine

## 2021-08-03 ENCOUNTER — Telehealth: Payer: Self-pay | Admitting: Internal Medicine

## 2021-08-03 NOTE — Telephone Encounter (Signed)
Routed to pharmD to review 

## 2021-08-03 NOTE — Telephone Encounter (Signed)
No drug interactions, but can increase pulse and blood pressure.  Recommend patient monitor. ?

## 2021-08-03 NOTE — Telephone Encounter (Signed)
Pt c/o medication issue: ? ?1. Name of Medication: Nuvigil ? ?2. How are you currently taking this medication (dosage and times per day)?   ? ?3. Are you having a reaction (difficulty breathing--STAT)? no ? ?4. What is your medication issue? Patient therapist want to prescribe this medication for him. Want to make sure it will be okay with Dr. Debara Pickett. Please advise ? ?

## 2021-08-03 NOTE — Telephone Encounter (Signed)
Patient aware and verbalized understanding. °

## 2021-08-22 ENCOUNTER — Other Ambulatory Visit: Payer: Self-pay | Admitting: Internal Medicine

## 2021-08-22 ENCOUNTER — Ambulatory Visit
Admission: RE | Admit: 2021-08-22 | Discharge: 2021-08-22 | Disposition: A | Discharge status: Home / Self Care | Payer: Medicare Other | Source: Ambulatory Visit | Attending: Internal Medicine | Admitting: Internal Medicine

## 2021-08-22 DIAGNOSIS — Z9049 Acquired absence of other specified parts of digestive tract: Secondary | ICD-10-CM | POA: Diagnosis not present

## 2021-08-22 DIAGNOSIS — R0609 Other forms of dyspnea: Secondary | ICD-10-CM

## 2021-08-22 DIAGNOSIS — I480 Paroxysmal atrial fibrillation: Secondary | ICD-10-CM | POA: Diagnosis not present

## 2021-08-22 DIAGNOSIS — R490 Dysphonia: Secondary | ICD-10-CM | POA: Diagnosis not present

## 2021-08-22 DIAGNOSIS — I700 Atherosclerosis of aorta: Secondary | ICD-10-CM | POA: Diagnosis not present

## 2021-08-22 DIAGNOSIS — R5383 Other fatigue: Secondary | ICD-10-CM | POA: Diagnosis not present

## 2021-08-22 DIAGNOSIS — I25119 Atherosclerotic heart disease of native coronary artery with unspecified angina pectoris: Secondary | ICD-10-CM | POA: Diagnosis not present

## 2021-08-22 DIAGNOSIS — I10 Essential (primary) hypertension: Secondary | ICD-10-CM | POA: Diagnosis not present

## 2021-09-03 ENCOUNTER — Other Ambulatory Visit (HOSPITAL_COMMUNITY): Payer: Self-pay | Admitting: Internal Medicine

## 2021-09-27 DIAGNOSIS — I10 Essential (primary) hypertension: Secondary | ICD-10-CM | POA: Diagnosis not present

## 2021-09-29 ENCOUNTER — Ambulatory Visit: Payer: Medicare Other | Admitting: Internal Medicine

## 2021-09-29 ENCOUNTER — Encounter: Payer: Self-pay | Admitting: Internal Medicine

## 2021-09-29 ENCOUNTER — Ambulatory Visit (INDEPENDENT_AMBULATORY_CARE_PROVIDER_SITE_OTHER): Payer: Medicare Other | Admitting: Internal Medicine

## 2021-09-29 VITALS — BP 130/78 | HR 64 | Temp 97.8°F | Ht 72.0 in | Wt 204.0 lb

## 2021-09-29 DIAGNOSIS — R0602 Shortness of breath: Secondary | ICD-10-CM

## 2021-09-29 DIAGNOSIS — I480 Paroxysmal atrial fibrillation: Secondary | ICD-10-CM | POA: Diagnosis not present

## 2021-09-29 LAB — PULMONARY FUNCTION TEST
DL/VA % pred: 118 %
DL/VA: 4.52 ml/min/mmHg/L
DLCO cor % pred: 87 %
DLCO cor: 22.12 ml/min/mmHg
DLCO unc % pred: 87 %
DLCO unc: 22.12 ml/min/mmHg
FEF 25-75 Post: 5.01 L/sec
FEF 25-75 Pre: 4.69 L/sec
FEF2575-%Change-Post: 6 %
FEF2575-%Pred-Post: 255 %
FEF2575-%Pred-Pre: 238 %
FEV1-%Change-Post: 0 %
FEV1-%Pred-Post: 94 %
FEV1-%Pred-Pre: 94 %
FEV1-Post: 2.81 L
FEV1-Pre: 2.8 L
FEV1FVC-%Change-Post: 0 %
FEV1FVC-%Pred-Pre: 122 %
FEV6-%Change-Post: 0 %
FEV6-%Pred-Post: 82 %
FEV6-%Pred-Pre: 82 %
FEV6-Post: 3.21 L
FEV6-Pre: 3.23 L
FEV6FVC-%Change-Post: 0 %
FEV6FVC-%Pred-Post: 107 %
FEV6FVC-%Pred-Pre: 107 %
FVC-%Change-Post: 0 %
FVC-%Pred-Post: 76 %
FVC-%Pred-Pre: 76 %
FVC-Post: 3.21 L
FVC-Pre: 3.23 L
Post FEV1/FVC ratio: 88 %
Post FEV6/FVC ratio: 100 %
Pre FEV1/FVC ratio: 87 %
Pre FEV6/FVC Ratio: 100 %
RV % pred: 102 %
RV: 2.92 L
TLC % pred: 87 %
TLC: 6.5 L

## 2021-09-29 NOTE — Progress Notes (Signed)
PFT done today. 

## 2021-09-29 NOTE — Progress Notes (Signed)
? ?      ?Corey [name]    [id number]    12-22-1934 ? ?Primary Care Physician:[name], Jonny Ruiz, MD ? ?Referring Physician: Kirby Funk, MD ?[address] ?[address] ?[place],  [place] [address] ?Reason for Consultation: shortness of breath.  ?Date of Consultation: 09/29/2021 ? ?Chief complaint:   ?Chief Complaint  ?Patient presents with  ? Consult  ?  Pt states he has had problems with SOB for about 1 year that happens when he exerts himself. Denies any problems with coughing, wheezing, or chest discomfort.  ?  ? ?HPI: ?Corey [name] is a 87 y.o. man with history of atrial fibrillation controlled on flecanide who presents for new patient evaluation of dyspnea on exertion. ? ?Symptoms started about 4 years ago but have worsened over the last year.  ?He used to play 18 holes of golf every other day and had to quit golf.  ?Over the last year notes progressive dyspnea  ?He has atrial fibrillation and has had DCCV and is now on flecanide 100 mg and has maintained NSR.   ? ?Dyspnea is with exertion such as mowing his lawn using the trimmer, climbing stairs.  ?He can perform ADLs.  ? ?He describes himself as very active and healthy and took minimal medications until 5 years ago.  ? ?Denies any childhood respiratory disease, recurrent pneumonia or bronchitis.  ? ?Denies chest tightness, wheezing, coughing.  ?Denies positional changes.  ?Walking up hill is worse than walking flat.  ?Denies seasonal allergies.  ? ?He has had a home sleep study with Dr. Valentina Lucks which was negative for sleep apnea (I am unable to view these results.) ? ? ?Social history: ? 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  ?Exposures: lives at home with wife. No pets. He is originally from [place] [facility] ?Smoking history: smoke for a year when he was 18. Passive smoke exposure in childhood.  ? ?Social History  ? ?Occupational History  ? Not on file  ?Tobacco Use  ? Smoking status: Former  ?   Packs/day: 1.00  ?  Years: 1.50  ?  Pack years: 1.50  ?  Types: Cigarettes  ?  Quit date: 07/15/1960  ?  Years since quitting: 61.2  ? Smokeless tobacco: Never  ?Substance and Sexual Activity  ? Alcohol use: No  ? Drug use: Not on file  ? Sexual activity: Yes  ? ? ?Relevant family history: ? ?Family History  ?Problem Relation Age of Onset  ? CAD Mother   ? Heart disease Mother   ? Heart attack Father   ? Cancer - Prostate Brother   ? Alcoholism Daughter   ? Arrhythmia Daughter   ? Healthy Son   ? Healthy Daughter   ? ? ?Past Medical History:  ?Diagnosis Date  ? Basal cell carcinoma of auricle of right ear   ? BPH (benign prostatic hyperplasia)   ? DJD (degenerative joint disease)   ? left  ? ED (erectile dysfunction)   ? Fatigue   ? Hearing loss, sensorineural   ? Hx of adenomatous colonic polyps   ? Hx of major depression   ? Hypertension   ? Iron deficiency anemia   ? Nephrolithiasis   ? Paroxysmal atrial fibrillation (HCC)   ? Seasonal allergic rhinitis   ? ? ?Past Surgical History:  ?Procedure Laterality Date  ? CARDIOVERSION N/A 08/13/2015  ? Procedure: CARDIOVERSION;  Surgeon: Chrystie Nose, MD;  Location: [facility] ENDOSCOPY;  Service:  Cardiovascular;  Laterality: N/A;  ? CARDIOVERSION N/A 04/04/2016  ? Procedure: CARDIOVERSION;  Surgeon: Quintella Reichert, MD;  Location: [facility] ENDOSCOPY;  Service: Cardiovascular;  Laterality: N/A;  ? CARDIOVERSION N/A 11/12/2017  ? Procedure: CARDIOVERSION;  Surgeon: Chrystie Nose, MD;  Location: [facility] ENDOSCOPY;  Service: Cardiovascular;  Laterality: N/A;  ? CHOLECYSTECTOMY    ? colonscopy    ? herniorraphy Bilateral   ? had surgery x2 on the left  and right  ? TONSILLECTOMY AND ADENOIDECTOMY    ? ? ? ?Physical Exam: ?Blood pressure 130/78, pulse 64, temperature 97.8 ?F (36.6 ?C), temperature source Oral, height 6' (1.829 m), weight 204 lb (92.5 kg), SpO2 96 %. ?Gen:      No acute distress ?ENT:  no nasal polyps, mucus membranes moist ?Lungs:    No increased respiratory effort, symmetric  chest wall excursion, clear to auscultation bilaterally, no wheezes or crackles ?CV:         Regular rate and rhythm; no murmurs, rubs, or gallops.  No pedal edema ?Abd:      + bowel sounds; soft, non-tender; no distension ?MSK: no acute synovitis of DIP or PIP joints, no mechanics hands.  ?Skin:      Warm and dry; no rashes ?Neuro: normal speech, no focal facial asymmetry ?Psych: alert and oriented x3, normal mood and affect ? ? ?Data Reviewed/Medical Decision Making: ? ?Independent interpretation of tests: ?Imaging: ? Review of patient's chest xray images march 2023 revealed no acute pulmonary process. The patient's images have been independently reviewed by me.   ? ?PFTs: ?I have personally reviewed the patient's PFTs and  ?   ? View : No data to display.  ?  ?  ?  ? ? ?Labs:  ?Lab Results  ?Component Value Date  ? WBC 9.1 12/18/2019  ? HGB 15.2 12/18/2019  ? HCT 45.5 12/18/2019  ? MCV 94.0 12/18/2019  ? PLT 306 12/18/2019  ? ?Lab Results  ?Component Value Date  ? NA 135 12/18/2019  ? K 4.4 12/18/2019  ? CL 97 (L) 12/18/2019  ? CO2 28 12/18/2019  ? ? ? ?Immunization status:  ?Immunization History  ?Administered Date(s) Administered  ? Fluad Quad(high Dose 65+) 02/12/2019  ? Influenza Split 03/05/2009, 02/25/2010, 03/02/2011, 02/18/2012, 01/27/2013, 02/26/2014, 01/28/2015  ? Influenza, High Dose Seasonal PF 02/16/2017, 02/15/2018  ? PFIZER(Purple Top)SARS-COV-2 Vaccination 06/07/2019, 06/28/2019  ? Pneumococcal Conjugate-13 08/04/2013  ? Pneumococcal Polysaccharide-23 05/29/2002  ? Td 06/12/2008  ? Zoster, Live 07/06/2006  ? ? ? I reviewed prior external note(s) from cardiology, pcp ? I reviewed the result(s) of the labs and imaging as noted above.  ? I have ordered pft ? ? ?Assessment:  ?Shortness of breath ?Atrial Fibrillation, rate and rhythm controlled on flecanide ? ?Plan/Recommendations: ?Will proceed with pfts. ?Low suspicion for smoking related lung disease or asthma ?No evidence of ILD.  ?Concern for  chronotropic incompetence vs deconditionig vs OSA - reportedly sleep study negative.  ?I will see him back after PFTs. ?  ?We discussed disease management and progression at length today.  ? ? ?Return to Care: ?Return in about 2 weeks (around 10/13/2021). ? ?Durel Salts, MD ?Pulmonary and Critical Care Medicine ?[facility] ?Office:[phone] ? ?CC: Kirby Funk, MD ? ? ? ?

## 2021-09-29 NOTE — Patient Instructions (Addendum)
Please schedule follow up scheduled with myself in 2-3 weeks.  If my schedule is not open yet, we will contact you with a reminder closer to that time. Please call [phone] if you haven't heard from [place] a month before.  ? ?Before your next visit I would like you to have: ? ?Full set of PFTs - 40 minutes ? ?

## 2021-10-17 DIAGNOSIS — H6123 Impacted cerumen, bilateral: Secondary | ICD-10-CM | POA: Diagnosis not present

## 2021-10-17 DIAGNOSIS — Z7901 Long term (current) use of anticoagulants: Secondary | ICD-10-CM | POA: Diagnosis not present

## 2021-10-17 DIAGNOSIS — J3489 Other specified disorders of nose and nasal sinuses: Secondary | ICD-10-CM | POA: Diagnosis not present

## 2021-10-17 DIAGNOSIS — Z9889 Other specified postprocedural states: Secondary | ICD-10-CM | POA: Diagnosis not present

## 2021-10-17 DIAGNOSIS — K219 Gastro-esophageal reflux disease without esophagitis: Secondary | ICD-10-CM | POA: Diagnosis not present

## 2021-10-17 DIAGNOSIS — Z9089 Acquired absence of other organs: Secondary | ICD-10-CM | POA: Diagnosis not present

## 2021-10-19 ENCOUNTER — Ambulatory Visit (INDEPENDENT_AMBULATORY_CARE_PROVIDER_SITE_OTHER): Payer: Medicare Other | Admitting: Internal Medicine

## 2021-10-19 ENCOUNTER — Encounter: Payer: Self-pay | Admitting: Internal Medicine

## 2021-10-19 VITALS — BP 118/72 | HR 60 | Ht 72.0 in | Wt 207.0 lb

## 2021-10-19 DIAGNOSIS — I480 Paroxysmal atrial fibrillation: Secondary | ICD-10-CM | POA: Diagnosis not present

## 2021-10-19 DIAGNOSIS — R0609 Other forms of dyspnea: Secondary | ICD-10-CM

## 2021-10-19 NOTE — Progress Notes (Signed)
Corey [name]    [id number]    07/30/1934  Primary Care Physician:[name], Jonny Ruiz, MD Date of Appointment: 10/19/2021 Established Patient Visit  Chief complaint:   Chief Complaint  Patient presents with   Follow-up    2 wk f/u for SOB. States his breathing has been stable since last visit. Denies any new concerns.      HPI: Corey [name] is a 87 y.o. man with history of A. Fib.   Interval Updates: Here for follow up after PFTs. They showed normal pulmonary function.  His dyspnea is stable without worsening.   I have reviewed the patient's family social and past medical history and updated as appropriate.   Past Medical History:  Diagnosis Date   Basal cell carcinoma of auricle of right ear    BPH (benign prostatic hyperplasia)    DJD (degenerative joint disease)    left   ED (erectile dysfunction)    Fatigue    Hearing loss, sensorineural    Hx of adenomatous colonic polyps    Hx of major depression    Hypertension    Iron deficiency anemia    Nephrolithiasis    Paroxysmal atrial fibrillation (HCC)    Seasonal allergic rhinitis     Past Surgical History:  Procedure Laterality Date   CARDIOVERSION N/A 08/13/2015   Procedure: CARDIOVERSION;  Surgeon: Chrystie Nose, MD;  Location: [facility] ENDOSCOPY;  Service: Cardiovascular;  Laterality: N/A;   CARDIOVERSION N/A 04/04/2016   Procedure: CARDIOVERSION;  Surgeon: Quintella Reichert, MD;  Location: [facility] ENDOSCOPY;  Service: Cardiovascular;  Laterality: N/A;   CARDIOVERSION N/A 11/12/2017   Procedure: CARDIOVERSION;  Surgeon: Chrystie Nose, MD;  Location: [facility] ENDOSCOPY;  Service: Cardiovascular;  Laterality: N/A;   CHOLECYSTECTOMY     colonscopy     herniorraphy Bilateral    had surgery x2 on the left  and right   TONSILLECTOMY AND ADENOIDECTOMY      Family History  Problem Relation Age of Onset   CAD Mother    Heart disease Mother    Heart attack Father    Cancer - Prostate Brother     Alcoholism Daughter    Arrhythmia Daughter    Healthy Son    Healthy Daughter     Social History   Occupational History   Not on file  Tobacco Use   Smoking status: Former    Packs/day: 1.00    Years: 1.50    Pack years: 1.50    Types: Cigarettes    Quit date: 07/15/1960    Years since quitting: 61.3   Smokeless tobacco: Never  Substance and Sexual Activity   Alcohol use: No   Drug use: Not on file   Sexual activity: Yes     Physical Exam: Blood pressure 118/72, pulse 60, height 6' (1.829 m), weight 207 lb (93.9 kg), SpO2 98 %.  Gen:      No acute distress Lungs:    No increased respiratory effort, symmetric chest wall excursion, clear to auscultation bilaterally, no wheezes or crackles CV:         RRR no edema   Data Reviewed: Imaging: I have personally reviewed the chest xray march 2023 - no acute process  PFTs:     Latest Ref Rng & Units 09/29/2021   11:44 AM  PFT Results  FVC-Pre L 3.23    FVC-Predicted Pre % 76    FVC-Post L 3.21    FVC-Predicted Post %  76    Pre FEV1/FVC % % 87    Post FEV1/FCV % % 88    FEV1-Pre L 2.80    FEV1-Predicted Pre % 94    FEV1-Post L 2.81    DLCO uncorrected ml/min/mmHg 22.12    DLCO UNC% % 87    DLCO corrected ml/min/mmHg 22.12    DLCO COR %Predicted % 87    DLVA Predicted % 118    TLC L 6.50    TLC % Predicted % 87    RV % Predicted % 102     I have personally reviewed the patient's PFTs and normal pulmonary function.   Labs: Lab Results  Component Value Date   WBC 9.1 12/18/2019   HGB 15.2 12/18/2019   HCT 45.5 12/18/2019   MCV 94.0 12/18/2019   PLT 306 12/18/2019   Lab Results  Component Value Date   NA 135 12/18/2019   K 4.4 12/18/2019   CL 97 (L) 12/18/2019   CO2 28 12/18/2019      Immunization status: Immunization History  Administered Date(s) Administered   Fluad Quad(high Dose 65+) 02/12/2019   Influenza Split 03/05/2009, 02/25/2010, 03/02/2011, 02/18/2012, 01/27/2013, 02/26/2014, 01/28/2015    Influenza, High Dose Seasonal PF 02/16/2017, 02/15/2018   PFIZER(Purple Top)SARS-COV-2 Vaccination 06/07/2019, 06/28/2019   Pneumococcal Conjugate-13 08/04/2013   Pneumococcal Polysaccharide-23 05/29/2002   Td 06/12/2008   Zoster, Live 07/06/2006    External Records Personally Reviewed: cardiology, pcp  Assessment:  Shortness of breath Atrial fibrillation on flecanide  Plan/Recommendations: He had an exercise stress test in Jan 2023 which was wnl without ischemic changes. At this point with normal pulmonary function and no evidence of ischemia I would probably suggest CPET testing to determine etiology of dyspnea.  He has had a sleep apnea work up which is unremarkable. Will talk with Dr. Rennis Golden to see if he has any other ideas.     Return to Care: Will contact him on next steps after CPET.    Durel Salts, MD Pulmonary and Critical Care Medicine [facility] Office:[phone]

## 2021-10-19 NOTE — Patient Instructions (Signed)
We will follow up with you once we have the results of the cardiopulmonary exercise test. This is a stress test that measures different parameters than the one you had in January. We will arrange the test for you and call you with instructions.   I will reach out to Dr. Debara Pickett to see if he has any other ideas.   Pulmonary function testing is normal.

## 2021-10-27 ENCOUNTER — Ambulatory Visit (HOSPITAL_COMMUNITY): Payer: Medicare Other | Attending: Internal Medicine

## 2021-10-27 DIAGNOSIS — R0600 Dyspnea, unspecified: Secondary | ICD-10-CM | POA: Diagnosis not present

## 2021-10-27 DIAGNOSIS — I480 Paroxysmal atrial fibrillation: Secondary | ICD-10-CM | POA: Insufficient documentation

## 2021-10-27 DIAGNOSIS — I10 Essential (primary) hypertension: Secondary | ICD-10-CM | POA: Diagnosis not present

## 2021-10-27 DIAGNOSIS — Z7901 Long term (current) use of anticoagulants: Secondary | ICD-10-CM | POA: Diagnosis not present

## 2021-10-27 DIAGNOSIS — R0609 Other forms of dyspnea: Secondary | ICD-10-CM | POA: Diagnosis not present

## 2021-11-02 ENCOUNTER — Telehealth: Payer: Self-pay | Admitting: Internal Medicine

## 2021-11-02 NOTE — Telephone Encounter (Signed)
-----   Message from Pixie Casino, MD sent at 10/28/2021 12:34 PM EDT ----- Regarding: RE: CPET Ok thanks .. doesn't look too bad.  ----- Message ----- From: Spero Geralds, MD Sent: 10/28/2021  11:13 AM EDT To: Pixie Casino, MD Subject: CPET                                           Still waiting on the final read for this. But here is the preliminary.   ----- Message ----- From: Suzan Slick T Sent: 10/27/2021  10:12 AM EDT To: Spero Geralds, MD

## 2021-11-02 NOTE — Telephone Encounter (Signed)
Please let the patient know we are still waiting for a final interpretation from the exercise test but the preliminary shows no major heart or lung problems during his test and that most likely this could be related to age related deconditioning. he should continue activity as tolerated without restriction and can follow up with Dr. Debara Pickett sooner if needed.

## 2021-11-03 NOTE — Telephone Encounter (Signed)
Spoke with pt and reviewed results as dictated for exercise test by Dr. Shearon Stalls. Pt stated understanding and no questions at this time.

## 2021-11-10 DIAGNOSIS — I10 Essential (primary) hypertension: Secondary | ICD-10-CM | POA: Diagnosis not present

## 2021-11-10 DIAGNOSIS — Z5181 Encounter for therapeutic drug level monitoring: Secondary | ICD-10-CM | POA: Diagnosis not present

## 2021-11-18 DIAGNOSIS — I10 Essential (primary) hypertension: Secondary | ICD-10-CM | POA: Diagnosis not present

## 2021-11-18 DIAGNOSIS — I480 Paroxysmal atrial fibrillation: Secondary | ICD-10-CM | POA: Diagnosis not present

## 2021-11-18 DIAGNOSIS — E7800 Pure hypercholesterolemia, unspecified: Secondary | ICD-10-CM | POA: Diagnosis not present

## 2022-02-03 DIAGNOSIS — D509 Iron deficiency anemia, unspecified: Secondary | ICD-10-CM | POA: Diagnosis not present

## 2022-02-03 DIAGNOSIS — I10 Essential (primary) hypertension: Secondary | ICD-10-CM | POA: Diagnosis not present

## 2022-02-03 DIAGNOSIS — E7800 Pure hypercholesterolemia, unspecified: Secondary | ICD-10-CM | POA: Diagnosis not present

## 2022-02-03 DIAGNOSIS — I480 Paroxysmal atrial fibrillation: Secondary | ICD-10-CM | POA: Diagnosis not present

## 2022-02-08 DIAGNOSIS — E7800 Pure hypercholesterolemia, unspecified: Secondary | ICD-10-CM | POA: Diagnosis not present

## 2022-02-08 DIAGNOSIS — Z23 Encounter for immunization: Secondary | ICD-10-CM | POA: Diagnosis not present

## 2022-02-08 DIAGNOSIS — I480 Paroxysmal atrial fibrillation: Secondary | ICD-10-CM | POA: Diagnosis not present

## 2022-02-08 DIAGNOSIS — I10 Essential (primary) hypertension: Secondary | ICD-10-CM | POA: Diagnosis not present

## 2022-02-08 DIAGNOSIS — I25119 Atherosclerotic heart disease of native coronary artery with unspecified angina pectoris: Secondary | ICD-10-CM | POA: Diagnosis not present

## 2022-02-08 DIAGNOSIS — R7303 Prediabetes: Secondary | ICD-10-CM | POA: Diagnosis not present

## 2022-02-08 DIAGNOSIS — D509 Iron deficiency anemia, unspecified: Secondary | ICD-10-CM | POA: Diagnosis not present

## 2022-02-08 DIAGNOSIS — Z0000 Encounter for general adult medical examination without abnormal findings: Secondary | ICD-10-CM | POA: Diagnosis not present

## 2022-02-08 DIAGNOSIS — E538 Deficiency of other specified B group vitamins: Secondary | ICD-10-CM | POA: Diagnosis not present

## 2022-05-04 DIAGNOSIS — Z135 Encounter for screening for eye and ear disorders: Secondary | ICD-10-CM | POA: Diagnosis not present

## 2022-05-04 DIAGNOSIS — H5203 Hypermetropia, bilateral: Secondary | ICD-10-CM | POA: Diagnosis not present

## 2022-05-04 DIAGNOSIS — H524 Presbyopia: Secondary | ICD-10-CM | POA: Diagnosis not present

## 2022-05-04 DIAGNOSIS — H2513 Age-related nuclear cataract, bilateral: Secondary | ICD-10-CM | POA: Diagnosis not present

## 2022-05-04 DIAGNOSIS — H52223 Regular astigmatism, bilateral: Secondary | ICD-10-CM | POA: Diagnosis not present

## 2022-06-13 ENCOUNTER — Other Ambulatory Visit (HOSPITAL_COMMUNITY): Payer: Self-pay | Admitting: Internal Medicine

## 2022-07-10 ENCOUNTER — Telehealth: Payer: Self-pay | Admitting: Internal Medicine

## 2022-07-10 ENCOUNTER — Other Ambulatory Visit (HOSPITAL_COMMUNITY): Payer: Self-pay | Admitting: Internal Medicine

## 2022-07-10 MED ORDER — FLECAINIDE ACETATE 100 MG PO TABS: 100.0000 mg | ORAL_TABLET | Freq: Two times a day (BID) | ORAL | 1 refills | Status: DC

## 2022-07-10 NOTE — Telephone Encounter (Signed)
*  STAT* If patient is at the pharmacy, call can be transferred to refill team.   1. Which medications need to be refilled? (please list name of each medication and dose if known)  flecainide (TAMBOCOR) 100 MG tablet  2. Which pharmacy/location (including street and city if local pharmacy) is medication to be sent to? [place] ([place]), [place] - [address]  3. Do they need a 30 day or 90 day supply?   90 day supply

## 2022-07-25 DIAGNOSIS — H40013 Open angle with borderline findings, low risk, bilateral: Secondary | ICD-10-CM | POA: Diagnosis not present

## 2022-07-25 DIAGNOSIS — H2513 Age-related nuclear cataract, bilateral: Secondary | ICD-10-CM | POA: Diagnosis not present

## 2022-07-25 DIAGNOSIS — H25013 Cortical age-related cataract, bilateral: Secondary | ICD-10-CM | POA: Diagnosis not present

## 2022-07-25 DIAGNOSIS — H2512 Age-related nuclear cataract, left eye: Secondary | ICD-10-CM | POA: Diagnosis not present

## 2022-07-25 DIAGNOSIS — H18413 Arcus senilis, bilateral: Secondary | ICD-10-CM | POA: Diagnosis not present

## 2022-08-10 ENCOUNTER — Ambulatory Visit
Admission: RE | Admit: 2022-08-10 | Discharge: 2022-08-10 | Disposition: A | Discharge status: Home / Self Care | Payer: Medicare Other | Source: Ambulatory Visit | Attending: Internal Medicine | Admitting: Internal Medicine

## 2022-08-10 ENCOUNTER — Other Ambulatory Visit: Payer: Self-pay | Admitting: Internal Medicine

## 2022-08-10 DIAGNOSIS — Z79899 Other long term (current) drug therapy: Secondary | ICD-10-CM | POA: Diagnosis not present

## 2022-08-10 DIAGNOSIS — E7800 Pure hypercholesterolemia, unspecified: Secondary | ICD-10-CM | POA: Diagnosis not present

## 2022-08-10 DIAGNOSIS — I480 Paroxysmal atrial fibrillation: Secondary | ICD-10-CM | POA: Diagnosis not present

## 2022-08-10 DIAGNOSIS — I25119 Atherosclerotic heart disease of native coronary artery with unspecified angina pectoris: Secondary | ICD-10-CM | POA: Diagnosis not present

## 2022-08-10 DIAGNOSIS — Z23 Encounter for immunization: Secondary | ICD-10-CM | POA: Diagnosis not present

## 2022-08-10 DIAGNOSIS — R0989 Other specified symptoms and signs involving the circulatory and respiratory systems: Secondary | ICD-10-CM

## 2022-08-10 DIAGNOSIS — E538 Deficiency of other specified B group vitamins: Secondary | ICD-10-CM | POA: Diagnosis not present

## 2022-08-10 DIAGNOSIS — I700 Atherosclerosis of aorta: Secondary | ICD-10-CM | POA: Diagnosis not present

## 2022-08-10 DIAGNOSIS — R7303 Prediabetes: Secondary | ICD-10-CM | POA: Diagnosis not present

## 2022-09-18 ENCOUNTER — Ambulatory Visit: Payer: Medicare Other | Attending: Internal Medicine | Admitting: Internal Medicine

## 2022-09-18 VITALS — BP 102/64 | HR 58 | Ht 72.0 in | Wt 209.0 lb

## 2022-09-18 DIAGNOSIS — I10 Essential (primary) hypertension: Secondary | ICD-10-CM | POA: Diagnosis not present

## 2022-09-18 DIAGNOSIS — Z5181 Encounter for therapeutic drug level monitoring: Secondary | ICD-10-CM | POA: Diagnosis not present

## 2022-09-18 DIAGNOSIS — Z79899 Other long term (current) drug therapy: Secondary | ICD-10-CM

## 2022-09-18 DIAGNOSIS — I480 Paroxysmal atrial fibrillation: Secondary | ICD-10-CM | POA: Diagnosis not present

## 2022-09-18 DIAGNOSIS — Z7901 Long term (current) use of anticoagulants: Secondary | ICD-10-CM

## 2022-09-18 MED ORDER — AMLODIPINE BESYLATE 2.5 MG PO TABS: 2.5000 mg | ORAL_TABLET | Freq: Every day | ORAL | 3 refills | Status: DC

## 2022-09-18 NOTE — Patient Instructions (Signed)
Medication Instructions:  DECREASE amlodipine to 2.5mg  daily    Follow-Up: At [facility], you and your health needs are our priority.  As part of our continuing mission to provide you with exceptional heart care, we have created designated Provider Care Teams.  These Care Teams include your primary Cardiologist (physician) and Advanced Practice Providers (APPs -  Physician Assistants and Nurse Practitioners) who all work together to provide you with the care you need, when you need it.  We recommend signing up for the patient portal called "MyChart".  Sign up information is provided on this After Visit Summary.  MyChart is used to connect with patients for Virtual Visits (Telemedicine).  Patients are able to view lab/test results, encounter notes, upcoming appointments, etc.  Non-urgent messages can be sent to your provider as well.   To learn more about what you can do with MyChart, go to [email or web address].    Your next appointment:    12 months with Dr. Rennis Golden

## 2022-09-18 NOTE — Progress Notes (Signed)
Cardiology [name] Note  Date:  09/18/2022   ID:  Corey [name] [birth year], MRN [id number]  PCP:  Pcp, No   CC: Follow-up   History of Present Illness: Corey [name] is a 88 y.o. male who presents for cardiology evaluation.  He is seen by me for the first time today.  He is seen at the request of Dr. Kirby Funk.  The patient is being seen because of atrial fibrillation of unknown duration.  The patient was seen for a physical in November 2016 by Dr. Valentina Lucks.  No EKG was done at that time according to the patient, but no mention of atrial fibrillation or irregular pulse wasn't mentioned.  Subsequently had a nurse practitioner house call about a week ago who performed a physical examination on him at his home.  She noted an irregular pulse.  Corey [name] was notified and he was seen in Dr. Jone Baseman [name] on 07/14/15 by Ida Rogue, NP, who obtained an electrocardiogram and confirmed the presence of atrial fibrillation. The patient himself is really not aware of his heart rate.  He does not know how long he may have been in the atrial fibrillation.  He began experiencing fatigue several years ago and because of that stop playing golf about 2 years ago.  He would give out after playing about 12 holes of golf.  He has not had any symptoms of congestive heart failure.  He sleeps on one pillow.  He does not have any difficulty climbing stairs.  He has had a sense of vague chest tightness at times.  There is no radiation to the arms.  The past 6 months the patient has also had occasional night sweats which are new. The patient has had some aching in the muscles of his legs.  He is on statin therapy. He has not had any TIA symptoms.  He has not had any dizziness or syncope.  Corey [name] is seen in the [name] today is a new patient to me however he was recently seen by Dr. Patty Sermons. Unfortunately Dr. Patty Sermons just retired and as Corey [name] wife is my patient, he  is kindly requested my services. This or Varnes says over the past year he's had some worsening fatigue and decreased energy levels. In fact she's given up a lot of activities including golf which he enjoyed playing. He thought it was just related to being older. He also stopped doing a lot of lawn work and other activities. Recently had a home physical where he was found to be out of rhythm and then had an EKG which demonstrated atrial fibrillation. He was started on Eliquis and was seen by Dr. Patty Sermons. An echocardiogram and stress test were ordered. Both studies showed low normal LVEF of 50-55% however the nuclear stress test showed no ischemia. He had only mild left atrial enlargement on his echocardiogram. Apparently Dr. Patty Sermons had discussed the possibility of a cardioversion with him but had recommended at least 1 month of anticoagulation. Today is his 28th day of Eliquis. He has not missed any doses. EKG shows persistent atrial fibrillation.  I saw Corey [name] back today in the [name] for follow-up. He underwent successful cardioversion to sinus rhythm and maintains that today. He notes that he's had some improvement in his energy and was able to mow his lawn without having to stop. He started walking again and is doing about a mile a day. Generally seems to be thriving. Heart rate is noted  to be low in the 50s but he noted that it was in the 50s for most of his life. He denies any chest pain, shortness of breath and as mentioned his energy level has improved. He seems to be tolerating Eliquis without any bleeding complications.  02/16/2016  Corey [name] is doing well without any complaints today. He is maintaining a sinus bradycardia with heart rate of 47 and is asymptomatic. He is not on any AV nodal blocking medications. He is on Eliquis 5 mg twice a day and has no complaints. He denies any bleeding. He is on Lipitor for dyslipidemia but has not had a recent lipid profile. He denies  chest pain or shortness of breath with exertion.  07/17/2016  Corey [name] returns today for follow-up. He is currently in sinus bradycardia with first-degree AV block at 53. He remains on Eliquis for anticoagulation. He was previously on flecainide 50 mg twice daily, but given recent palpitations his medication was increased to 75 mg twice daily and then 100 mg twice daily in the A. fib clinic. He reports good control of her palpitations now although heart rate is low in the 50s. He says with exercise he gets short of breath and has some fatigue which could indicate some chronotropic incompetence.  08/03/2016  Corey [name] returns today for follow-up of his stress test. He was able to exercise for 7 minutes and achieved a max predicted heart rate of 92%. He achieved a heart rate of 85% max predicted after 4.5 minutes and had recovery after 7 minutes. There is no evidence of chronotropic incompetence. The study was negative for ischemia. He reports the shortness of breath has improved significantly. He's been able to do some heavy yard work since then without any difficulty. I suspect his symptoms actually were related to probably atrial mechanical inactivity with his recent A. fib which was slow to recover despite the fact that he is flecainide has restored sinus rhythm.  01/30/2017  Corey [name] returns today for follow-up. He continues to have bradycardia. He had exercise stress testing which showed improvement in heart rate and no chronotropic incompetence. He is walking now up to about 2 miles. He says his heart rate can get up into the 80s. His shortness of breath is still present with marked exertion but goes away fairly quickly. He is maintaining a sinus bradycardia. He denies any bleeding problems on the Eliquis. He said no labs that we can detect this year. His last lipid profile was September 2017 which showed an LDL 69.  02/21/2018  Corey [name] is seen today for annual follow-up.   He remains asymptomatic and denies any worsening or recurrent atrial fibrillation.  He has persistent bradycardia but says his heart rate goes up with exercise and monitors it with a fit bit.  He does get short of breath only with marked exertion and it improves quickly after resting.  Blood pressures been well controlled.  He denies any adverse bleeding on Eliquis.  He had labs with his PCP at [facility] primary care this year and had a well-controlled lipid profile.  02/12/2019  Mr. Lemelin returns today for follow-up of his stress test.  He underwent routine exercise treadmill stress testing due to flecainide for which she takes for atrial flutter.  The treadmill stress test however was abnormal demonstrating significant ST segment depression.  Fortunately, he did not have any symptoms of chest pain or worsening shortness of breath during the study.  A solitary PVC was noted.  He says afterwards his heart rate was elevated somewhat in the 60s to 80s and bounced around during the day but then went back into the 40s or 50s the next day which is his baseline.  Overall he feels well.  He also had recent Mohs surgery for basal cell carcinoma on his nose, unfortunately they did not hold his anticoagulation prior to the procedure but subsequently requested holding it after he had significant bleeding complications.  Fortunately that has resolved.  04/20/2020  Mr. Prinkey is seen today for annual follow-up.  He had been seen a couple times over the summer regarding issues with possible A. fib.  He noted some increase in heart rate.  EKG however did not show any breakthrough A. fib.  He has been unfortunately struggling with recurrent depression.  Recently been started on some medication which is a newer medicine and seems to be helping him.  This is good because there were some unfortunate suicidal thoughts to the point where he said that his son had taken away a pistol that he owned in by his bedside.  Today  he denies any suicidal ideations.  06/17/2021  Mr. Ducker returns for follow-up.  More recently he has noted some shortness of breath and fatigue with exercise.  Over the summer it was worse when trying to mow the lawn.  He also noted it seems to be worse when he wakes up in the morning but gradually improves somewhat during the day.  He also has symptoms when walking up steps or carrying things.  He denies any frank chest pain.  He did undergo treadmill stress testing in 2020 which showed some mild ST segment depressions and was considered abnormal.  We did not do any follow-up testing at the time as he was asymptomatic.  This was mainly to assess EKG changes on flecainide.  Now it seems like he may be symptomatic.  09/18/2022  Mr. Musarra is seen today in follow-up.  He seems to be doing well.  He does get some shortness of breath and fatigue with exercise.  He underwent cardiopulmonary exercise testing from pulmonary and may have had some increased in oxygen utilization with exercise possibly related to pulmonary hypertension.  He does note that recently he has been having a lower blood pressure.  He had some systolics in the 90s but generally it runs of just over 100 systolic.  This was after addition of amlodipine to his valsartan and HCTZ.  Prior syncopal episode.  Recurrent A-fib on flecainide.  Past Medical History:  Diagnosis Date   Basal cell carcinoma of auricle of right ear    BPH (benign prostatic hyperplasia)    DJD (degenerative joint disease)    left   ED (erectile dysfunction)    Fatigue    Hearing loss, sensorineural    Hx of adenomatous colonic polyps    Hx of major depression    Hypertension    Iron deficiency anemia    Nephrolithiasis    Paroxysmal atrial fibrillation (HCC)    Seasonal allergic rhinitis     Past Surgical History:  Procedure Laterality Date   CARDIOVERSION N/A 08/13/2015   Procedure: CARDIOVERSION;  Surgeon: Chrystie Nose, MD;  Location: [facility]  ENDOSCOPY;  Service: Cardiovascular;  Laterality: N/A;   CARDIOVERSION N/A 04/04/2016   Procedure: CARDIOVERSION;  Surgeon: Quintella Reichert, MD;  Location: [facility] ENDOSCOPY;  Service: Cardiovascular;  Laterality: N/A;   CARDIOVERSION N/A 11/12/2017   Procedure: CARDIOVERSION;  Surgeon: Chrystie Nose, MD;  Location: [facility] ENDOSCOPY;  Service: Cardiovascular;  Laterality: N/A;   CHOLECYSTECTOMY     colonscopy     herniorraphy Bilateral    had surgery x2 on the left  and right   TONSILLECTOMY AND ADENOIDECTOMY       Current Outpatient Medications  Medication Sig Dispense Refill   apixaban (ELIQUIS) 5 MG TABS tablet Take 5 mg by mouth 2 (two) times daily.     atorvastatin (LIPITOR) 20 MG tablet Take 20 mg by mouth daily.     buPROPion (WELLBUTRIN SR) 150 MG 12 hr tablet Take 150 mg by mouth daily.     busPIRone (BUSPAR) 7.5 MG tablet Take 7.5 mg by mouth 3 (three) times daily.     ferrous sulfate 324 MG TBEC Take 324 mg by mouth daily with breakfast.     flecainide (TAMBOCOR) 100 MG tablet Take 1 tablet (100 mg total) by mouth 2 (two) times daily. 90 tablet 1   LORazepam (ATIVAN) 0.5 MG tablet Take 0.5 mg by mouth daily.     mirtazapine (REMERON) 30 MG tablet Take 30 mg by mouth daily.     valsartan-hydrochlorothiazide (DIOVAN-HCT) 160-12.5 MG tablet Take 1 tablet by mouth daily.     vitamin B-12 (CYANOCOBALAMIN) 500 MCG tablet Take 500 mcg by mouth daily.     amLODipine (NORVASC) 2.5 MG tablet Take 1 tablet (2.5 mg total) by mouth daily. 90 tablet 3   No current facility-administered medications for this visit.    Allergies:   Patient has no known allergies.    Social History:  The patient  reports that he quit smoking about 62 years ago. His smoking use included cigarettes. He has a 1.50 pack-year smoking history. He has never used smokeless tobacco. He reports that he does not drink alcohol.   Family History:  The patient's family history includes Alcoholism in his daughter; Arrhythmia in  his daughter; CAD in his mother; Cancer - Prostate in his brother; Healthy in his daughter and son; Heart attack in his father; Heart disease in his mother. history father died of a heart attack at age 68.  Mother died of postoperative hemorrhage after heart surgery at age 57   ROS:  Pertinent items noted in HPI and remainder of comprehensive ROS otherwise negative.  PHYSICAL EXAM: VS:  BP 102/64 (BP Location: Right Arm, Patient Position: Sitting, Cuff Size: Normal)   Pulse (!) 58   Ht 6' (1.829 m)   Wt 209 lb (94.8 kg)   SpO2 96%   BMI 28.35 kg/m  , BMI Body mass index is 28.35 kg/m. General appearance: alert and no distress Neck: no carotid bruit, no JVD and thyroid not enlarged, symmetric, no tenderness/mass/nodules Lungs: clear to auscultation bilaterally Heart: regular bradycardia Abdomen: soft, non-tender; bowel sounds normal; no masses,  no organomegaly Extremities: extremities normal, atraumatic, no cyanosis or edema Pulses: 2+ and symmetric Skin: Skin color, texture, turgor normal. No rashes or lesions Neurologic: Grossly normal Psych: pleasant  EKG:  Sinus bradycardia with first-degree AV block, bifascicular block at 58-personally reviewed  Recent Labs: No results found for requested labs within last 365 days.    Lipid Panel    Component Value Date/Time   CHOL 148 01/31/2017 0849   TRIG 70 01/31/2017 0849   HDL 69 01/31/2017 0849   CHOLHDL 2.1 01/31/2017 0849   CHOLHDL 2.1 02/16/2016 0826   VLDL 17 02/16/2016 0826   LDLCALC 65 01/31/2017 0849      Wt Readings from Last 3 Encounters:  09/18/22 209 lb (94.8 kg)  10/19/21 207 lb (93.9 kg)  09/29/21 204 lb (92.5 kg)    ASSESSMENT AND PLAN:  1.  Atrial fibrillation - CHADSVASC score of 3 on Eliquis 2.  Essential hypertension 3.  History of hypercholesterolemia 4.  DOE -abnormal exercise treadmill stress test 5.  Depression with suicidal ideation  PLAN:    Mr. Losano seems to be doing well although  does still get some fatigue and shortness of breath with exertion.  He had cardiopulmonary exercise testing which showed some ventilatory limitation with an increase in heart rate.  He gets occasional increases in heart rate which may be breakthrough A-fib.  EKG today however shows a sinus bradycardia with bifascicular block.  He is on flecainide.  No anginal symptoms.  He has some low blood pressures on medical therapy.  Will cut back on his amlodipine from 5 to 2-1/2 mg daily and continue his current dose of the valsartan and HCTZ.  Follow-up annually or sooner as necessary.  Chrystie Nose, MD, [facility], FACP  [place]  [facility]  Medical Director of the Advanced Lipid Disorders &  Cardiovascular Risk Reduction Clinic Diplomate of the American Board of Clinical Lipidology Attending Cardiologist  Direct Dial: [phone]  Fax: [phone]  Website:  [email or web address]  09/18/2022 1:43 PM

## 2022-10-09 DIAGNOSIS — H25812 Combined forms of age-related cataract, left eye: Secondary | ICD-10-CM | POA: Diagnosis not present

## 2022-10-09 DIAGNOSIS — H2512 Age-related nuclear cataract, left eye: Secondary | ICD-10-CM | POA: Diagnosis not present

## 2022-10-10 DIAGNOSIS — H2511 Age-related nuclear cataract, right eye: Secondary | ICD-10-CM | POA: Diagnosis not present

## 2022-10-30 DIAGNOSIS — H25811 Combined forms of age-related cataract, right eye: Secondary | ICD-10-CM | POA: Diagnosis not present

## 2022-10-30 DIAGNOSIS — H2511 Age-related nuclear cataract, right eye: Secondary | ICD-10-CM | POA: Diagnosis not present

## 2022-12-18 ENCOUNTER — Ambulatory Visit: Payer: Medicare Other | Admitting: Internal Medicine

## 2023-02-13 DIAGNOSIS — I10 Essential (primary) hypertension: Secondary | ICD-10-CM | POA: Diagnosis not present

## 2023-02-13 DIAGNOSIS — I480 Paroxysmal atrial fibrillation: Secondary | ICD-10-CM | POA: Diagnosis not present

## 2023-02-13 DIAGNOSIS — R7303 Prediabetes: Secondary | ICD-10-CM | POA: Diagnosis not present

## 2023-02-13 DIAGNOSIS — R0989 Other specified symptoms and signs involving the circulatory and respiratory systems: Secondary | ICD-10-CM | POA: Diagnosis not present

## 2023-02-13 DIAGNOSIS — R748 Abnormal levels of other serum enzymes: Secondary | ICD-10-CM | POA: Diagnosis not present

## 2023-02-13 DIAGNOSIS — Z79899 Other long term (current) drug therapy: Secondary | ICD-10-CM | POA: Diagnosis not present

## 2023-02-13 DIAGNOSIS — E538 Deficiency of other specified B group vitamins: Secondary | ICD-10-CM | POA: Diagnosis not present

## 2023-02-13 DIAGNOSIS — I25119 Atherosclerotic heart disease of native coronary artery with unspecified angina pectoris: Secondary | ICD-10-CM | POA: Diagnosis not present

## 2023-02-13 DIAGNOSIS — E7800 Pure hypercholesterolemia, unspecified: Secondary | ICD-10-CM | POA: Diagnosis not present

## 2023-02-13 DIAGNOSIS — Z23 Encounter for immunization: Secondary | ICD-10-CM | POA: Diagnosis not present

## 2023-02-13 DIAGNOSIS — Z0000 Encounter for general adult medical examination without abnormal findings: Secondary | ICD-10-CM | POA: Diagnosis not present

## 2023-02-19 ENCOUNTER — Encounter: Payer: Self-pay | Admitting: Internal Medicine

## 2023-02-19 ENCOUNTER — Other Ambulatory Visit: Payer: Self-pay | Admitting: Internal Medicine

## 2023-02-19 DIAGNOSIS — R748 Abnormal levels of other serum enzymes: Secondary | ICD-10-CM

## 2023-02-26 ENCOUNTER — Ambulatory Visit
Admission: RE | Admit: 2023-02-26 | Discharge: 2023-02-26 | Disposition: A | Discharge status: Home / Self Care | Payer: Medicare Other | Source: Ambulatory Visit | Attending: Internal Medicine | Admitting: Internal Medicine

## 2023-02-26 DIAGNOSIS — R7989 Other specified abnormal findings of blood chemistry: Secondary | ICD-10-CM | POA: Diagnosis not present

## 2023-02-26 DIAGNOSIS — R748 Abnormal levels of other serum enzymes: Secondary | ICD-10-CM

## 2023-02-26 DIAGNOSIS — R932 Abnormal findings on diagnostic imaging of liver and biliary tract: Secondary | ICD-10-CM | POA: Diagnosis not present

## 2023-02-27 ENCOUNTER — Other Ambulatory Visit: Payer: Self-pay | Admitting: Internal Medicine

## 2023-02-27 DIAGNOSIS — R9389 Abnormal findings on diagnostic imaging of other specified body structures: Secondary | ICD-10-CM

## 2023-03-09 ENCOUNTER — Ambulatory Visit
Admission: RE | Admit: 2023-03-09 | Discharge: 2023-03-09 | Disposition: A | Discharge status: Home / Self Care | Payer: Medicare Other | Source: Ambulatory Visit | Attending: Internal Medicine | Admitting: Internal Medicine

## 2023-03-09 DIAGNOSIS — R9389 Abnormal findings on diagnostic imaging of other specified body structures: Secondary | ICD-10-CM

## 2023-03-09 DIAGNOSIS — C787 Secondary malignant neoplasm of liver and intrahepatic bile duct: Secondary | ICD-10-CM | POA: Diagnosis not present

## 2023-03-09 DIAGNOSIS — C801 Malignant (primary) neoplasm, unspecified: Secondary | ICD-10-CM | POA: Diagnosis not present

## 2023-03-09 MED ORDER — GADOPICLENOL 0.5 MMOL/ML IV SOLN
9.0000 mL | Freq: Once | INTRAVENOUS | Status: AC | PRN
  Administered 2023-03-09: 9 mL via INTRAVENOUS

## 2023-03-20 ENCOUNTER — Other Ambulatory Visit (HOSPITAL_BASED_OUTPATIENT_CLINIC_OR_DEPARTMENT_OTHER): Payer: Self-pay | Admitting: Internal Medicine

## 2023-03-20 DIAGNOSIS — C787 Secondary malignant neoplasm of liver and intrahepatic bile duct: Secondary | ICD-10-CM

## 2023-03-21 ENCOUNTER — Ambulatory Visit (HOSPITAL_COMMUNITY)
Admission: RE | Admit: 2023-03-21 | Discharge: 2023-03-21 | Disposition: A | Discharge status: Home / Self Care | Payer: Medicare Other | Source: Ambulatory Visit | Attending: Internal Medicine | Admitting: Internal Medicine

## 2023-03-21 DIAGNOSIS — I700 Atherosclerosis of aorta: Secondary | ICD-10-CM | POA: Diagnosis not present

## 2023-03-21 DIAGNOSIS — C787 Secondary malignant neoplasm of liver and intrahepatic bile duct: Secondary | ICD-10-CM | POA: Diagnosis not present

## 2023-03-21 DIAGNOSIS — R599 Enlarged lymph nodes, unspecified: Secondary | ICD-10-CM | POA: Diagnosis not present

## 2023-03-21 DIAGNOSIS — J849 Interstitial pulmonary disease, unspecified: Secondary | ICD-10-CM | POA: Diagnosis not present

## 2023-03-21 MED ORDER — IOHEXOL 300 MG/ML  SOLN
100.0000 mL | Freq: Once | INTRAMUSCULAR | Status: AC | PRN
  Administered 2023-03-21: 100 mL via INTRAVENOUS

## 2023-03-29 ENCOUNTER — Encounter: Payer: Self-pay | Admitting: Hematology

## 2023-03-29 ENCOUNTER — Other Ambulatory Visit: Payer: Self-pay

## 2023-03-29 ENCOUNTER — Inpatient Hospital Stay: Payer: Medicare Other

## 2023-03-29 ENCOUNTER — Inpatient Hospital Stay: Payer: Medicare Other | Attending: Hematology | Admitting: Hematology

## 2023-03-29 VITALS — BP 135/87 | HR 76 | Temp 97.8°F | Resp 17 | Wt 200.0 lb

## 2023-03-29 DIAGNOSIS — R5383 Other fatigue: Secondary | ICD-10-CM | POA: Diagnosis not present

## 2023-03-29 DIAGNOSIS — Z87891 Personal history of nicotine dependence: Secondary | ICD-10-CM

## 2023-03-29 DIAGNOSIS — N400 Enlarged prostate without lower urinary tract symptoms: Secondary | ICD-10-CM

## 2023-03-29 DIAGNOSIS — R978 Other abnormal tumor markers: Secondary | ICD-10-CM | POA: Diagnosis not present

## 2023-03-29 DIAGNOSIS — R160 Hepatomegaly, not elsewhere classified: Secondary | ICD-10-CM

## 2023-03-29 DIAGNOSIS — K769 Liver disease, unspecified: Secondary | ICD-10-CM | POA: Diagnosis not present

## 2023-03-29 DIAGNOSIS — R9389 Abnormal findings on diagnostic imaging of other specified body structures: Secondary | ICD-10-CM

## 2023-03-29 DIAGNOSIS — R531 Weakness: Secondary | ICD-10-CM

## 2023-03-29 LAB — COMPREHENSIVE METABOLIC PANEL
ALT: 49 U/L — ABNORMAL HIGH (ref 0–44)
AST: 47 U/L — ABNORMAL HIGH (ref 15–41)
Albumin: 3.7 g/dL (ref 3.5–5.0)
Alkaline Phosphatase: 577 U/L — ABNORMAL HIGH (ref 38–126)
Anion gap: 7 (ref 5–15)
BUN: 24 mg/dL — ABNORMAL HIGH (ref 8–23)
CO2: 30 mmol/L (ref 22–32)
Calcium: 9.3 mg/dL (ref 8.9–10.3)
Chloride: 99 mmol/L (ref 98–111)
Creatinine, Ser: 1.25 mg/dL — ABNORMAL HIGH (ref 0.61–1.24)
GFR, Estimated: 56 mL/min — ABNORMAL LOW (ref >60)
Glucose, Bld: 184 mg/dL — ABNORMAL HIGH (ref 70–99)
Potassium: 4.1 mmol/L (ref 3.5–5.1)
Sodium: 136 mmol/L (ref 135–145)
Total Bilirubin: 1.1 mg/dL (ref 0.3–1.2)
Total Protein: 7.2 g/dL (ref 6.5–8.1)

## 2023-03-29 LAB — CBC WITH DIFFERENTIAL/PLATELET
Abs Immature Granulocytes: 0.03 10*3/uL (ref 0.00–0.07)
Basophils Absolute: 0.1 10*3/uL (ref 0.0–0.1)
Basophils Relative: 1 %
Eosinophils Absolute: 0.2 10*3/uL (ref 0.0–0.5)
Eosinophils Relative: 2 %
HCT: 41.6 % (ref 39.0–52.0)
Hemoglobin: 13.5 g/dL (ref 13.0–17.0)
Immature Granulocytes: 0 %
Lymphocytes Relative: 13 %
Lymphs Abs: 1.1 10*3/uL (ref 0.7–4.0)
MCH: 30.5 pg (ref 26.0–34.0)
MCHC: 32.5 g/dL (ref 30.0–36.0)
MCV: 93.9 fL (ref 80.0–100.0)
Monocytes Absolute: 0.8 10*3/uL (ref 0.1–1.0)
Monocytes Relative: 10 %
Neutro Abs: 6.2 10*3/uL (ref 1.7–7.7)
Neutrophils Relative %: 74 %
Platelets: 345 10*3/uL (ref 150–400)
RBC: 4.43 MIL/uL (ref 4.22–5.81)
RDW: 13.2 % (ref 11.5–15.5)
WBC: 8.3 10*3/uL (ref 4.0–10.5)
nRBC: 0 % (ref 0.0–0.2)

## 2023-03-29 NOTE — Progress Notes (Signed)
[facility]   [phone] [phone]   Clinic New Consult Note   Patient Care Team: Pcp, No as PCP - General Chrystie Nose, MD as PCP - Cardiology (Cardiology) 03/29/2023  CHIEF COMPLAINTS/PURPOSE OF CONSULTATION:  Diffuse liver lesions, suspicious for metastatic malignancy  Referring physician: Dr. Eleanora Neighbor   Discussed the use of AI scribe software for clinical note transcription with the patient, who gave verbal consent to proceed.  History of Present Illness   An 88 year old patient presents with a referral for an abnormal CT scan suspicious for malignancy.  Patient was referred by his PCP.  He presented to clinic with his daughter.    The patient reports experiencing fatigue and weakness for about six weeks, which has been progressively worsening. He describes the weakness as so severe that he would likely pass out if he attempted to run to the door. He has been able to do minor chores and go up and down the stairs, but even minor activities like feeding his birds leave him needing to rest. He denies experiencing any pain, but notes occasional discomfort on his kidney sides that is relieved by reclining.   Pt was seen by his PCP Dr. Orson Aloe for routine follow-up in September 2023, labs showed mildly elevated alkaline phosphatase, CBC was unremarkable.  B12 level was slightly elevated.  He underwent ultrasound of abdomen on February 26, 2023, which showed possible liver masses.  Liver MRI with and without contrast was subsequently obtained on March 09, 2023, which showed innumerable hepatic metastasis.  CT of the chest, abdomen pelvis with contrast for further evaluation reviewed no obvious primary tumor except enlarged aortocaval lymph node measuring 1.3 cm, and a left supraclavicular lymph node measuring 1 cm.  CT confirmed multifocal liver metastasis.  He has not had tissue biopsy.  He has a history of atrial fibrillation, kidney  stones, hypertension, depression, and colon polyps. He also has a history of basal cell skin cancer. He has had surgeries in the past for hernias, gallbladder removal, and tonsillectomy. He is currently on several medications including amlodipine, flecainide, Eliquis, Lipitor, Ativan, mirtazapine, and B12. He does not smoke or drink alcohol.      MEDICAL HISTORY:  Past Medical History:  Diagnosis Date   Basal cell carcinoma of auricle of right ear    BPH (benign prostatic hyperplasia)    DJD (degenerative joint disease)    left   ED (erectile dysfunction)    Fatigue    Hearing loss, sensorineural    Hx of adenomatous colonic polyps    Hx of major depression    Hypertension    Iron deficiency anemia    Nephrolithiasis    Paroxysmal atrial fibrillation (HCC)    Seasonal allergic rhinitis     SURGICAL HISTORY: Past Surgical History:  Procedure Laterality Date   CARDIOVERSION N/A 08/13/2015   Procedure: CARDIOVERSION;  Surgeon: Chrystie Nose, MD;  Location: [facility] ENDOSCOPY;  Service: Cardiovascular;  Laterality: N/A;   CARDIOVERSION N/A 04/04/2016   Procedure: CARDIOVERSION;  Surgeon: Quintella Reichert, MD;  Location: [facility] ENDOSCOPY;  Service: Cardiovascular;  Laterality: N/A;   CARDIOVERSION N/A 11/12/2017   Procedure: CARDIOVERSION;  Surgeon: Chrystie Nose, MD;  Location: [facility] ENDOSCOPY;  Service: Cardiovascular;  Laterality: N/A;   CHOLECYSTECTOMY     colonscopy     herniorraphy Bilateral    had surgery x2 on the left  and right   TONSILLECTOMY AND ADENOIDECTOMY      SOCIAL HISTORY: Social  History   Socioeconomic History   Marital status: Married    Spouse name: Not on file   Number of children: Not on file   Years of education: Not on file   Highest education level: Not on file  Occupational History   Not on file  Tobacco Use   Smoking status: Former    Current packs/day: 0.00    Average packs/day: 1 pack/day for 1.5 years (1.5 ttl pk-yrs)    Types: Cigarettes    Start  date: 01/15/1959    Quit date: 07/15/1960    Years since quitting: 62.7   Smokeless tobacco: Never  Substance and Sexual Activity   Alcohol use: No   Drug use: Not on file   Sexual activity: Yes  Other Topics Concern   Not on file  Social History Narrative   Not on file   Social Determinants of Health   Financial Resource Strain: Not on file  Food Insecurity: Not on file  Transportation Needs: Not on file  Physical Activity: Not on file  Stress: Not on file  Social Connections: Not on file  Intimate Partner Violence: Not on file    FAMILY HISTORY: Family History  Problem Relation Age of Onset   CAD Mother    Heart disease Mother    Heart attack Father    Cancer - Prostate Brother    Alcoholism Daughter    Arrhythmia Daughter    Healthy Son    Healthy Daughter     ALLERGIES:  has No Known Allergies.  MEDICATIONS:  Current Outpatient Medications  Medication Sig Dispense Refill   amLODipine (NORVASC) 2.5 MG tablet Take 1 tablet (2.5 mg total) by mouth daily. 90 tablet 3   apixaban (ELIQUIS) 5 MG TABS tablet Take 5 mg by mouth 2 (two) times daily.     atorvastatin (LIPITOR) 20 MG tablet Take 20 mg by mouth daily.     buPROPion (WELLBUTRIN SR) 150 MG 12 hr tablet Take 150 mg by mouth daily.     ferrous sulfate 324 MG TBEC Take 324 mg by mouth daily with breakfast.     flecainide (TAMBOCOR) 100 MG tablet Take 1 tablet (100 mg total) by mouth 2 (two) times daily. 90 tablet 1   LORazepam (ATIVAN) 0.5 MG tablet Take 0.5 mg by mouth daily.     mirtazapine (REMERON) 30 MG tablet Take 30 mg by mouth daily.     valsartan-hydrochlorothiazide (DIOVAN-HCT) 160-12.5 MG tablet Take 1 tablet by mouth daily.     vitamin B-12 (CYANOCOBALAMIN) 500 MCG tablet Take 500 mcg by mouth daily.     No current facility-administered medications for this visit.    REVIEW OF SYSTEMS:   Constitutional: Denies fevers, chills or abnormal night sweats Eyes: Denies blurriness of vision, double  vision or watery eyes Ears, nose, mouth, throat, and face: Denies mucositis or sore throat Respiratory: Denies cough, dyspnea or wheezes Cardiovascular: Denies palpitation, chest discomfort or lower extremity swelling Gastrointestinal:  Denies nausea, heartburn or change in bowel habits Skin: Denies abnormal skin rashes Lymphatics: Denies new lymphadenopathy or easy bruising Neurological:Denies numbness, tingling or new weaknesses Behavioral/Psych: Mood is stable, no new changes  All other systems were reviewed with the patient and are negative.  PHYSICAL EXAMINATION: ECOG PERFORMANCE STATUS: 2 - Symptomatic, <50% confined to bed  Vitals:   03/29/23 1444  BP: 135/87  Pulse: 76  Resp: 17  Temp: 97.8 F (36.6 C)  SpO2: 96%   Filed Weights   03/29/23 1444  Weight: 200 lb (90.7 kg)    GENERAL:alert, no distress and comfortable SKIN: skin color, texture, turgor are normal, no rashes or significant lesions EYES: normal, conjunctiva are pink and non-injected, sclera clear OROPHARYNX:no exudate, no erythema and lips, buccal mucosa, and tongue normal  NECK: supple, thyroid normal size, non-tender, without nodularity LYMPH:  no palpable lymphadenopathy in the cervical, axillary or inguinal LUNGS: clear to auscultation and percussion with normal breathing effort HEART: regular rate & rhythm and no murmurs and no lower extremity edema ABDOMEN:abdomen soft, non-tender and normal bowel sounds Musculoskeletal:no cyanosis of digits and no clubbing  PSYCH: alert & oriented x 3 with fluent speech NEURO: no focal motor/sensory deficits  Physical Exam   ABDOMEN: Liver slightly enlarged, edge palpable a couple of centimeters below the ribcage, not tender.      LABORATORY DATA:  I have reviewed the data as listed    Latest Ref Rng & Units 03/29/2023    3:30 PM 12/18/2019    3:52 PM 11/28/2019    6:30 AM  CBC  WBC 4.0 - 10.5 K/uL 8.3  9.1  7.1   Hemoglobin 13.0 - 17.0 g/dL 16.1  09.6   04.5   Hematocrit 39.0 - 52.0 % 41.6  45.5  42.7   Platelets 150 - 400 K/uL 345  306  313     @cmpl @  RADIOGRAPHIC STUDIES: I have personally reviewed the radiological images as listed and agreed with the findings in the report. CT CHEST ABDOMEN PELVIS W CONTRAST  Result Date: 03/21/2023 CLINICAL DATA:  Metastatic disease evaluation. * Tracking Code: BO * EXAM: CT CHEST, ABDOMEN, AND PELVIS WITH CONTRAST TECHNIQUE: Multidetector CT imaging of the chest, abdomen and pelvis was performed following the standard protocol during bolus administration of intravenous contrast. RADIATION DOSE REDUCTION: This exam was performed according to the departmental dose-optimization program which includes automated exposure control, adjustment of the mA and/or kV according to patient size and/or use of iterative reconstruction technique. CONTRAST:  OMNIPAQUE IOHEXOL 300 MG/ML  SOLN COMPARISON:  Abdominal MRI 03/09/2023 FINDINGS: CT CHEST FINDINGS Cardiovascular: Heart size is within normal limits. Aortic atherosclerosis and coronary artery calcifications. No pericardial effusion. Mediastinum/Nodes: Left supraclavicular lymph node measures 1 cm, image 7/7. No enlarged mediastinal or hilar lymph nodes. No axillary adenopathy. The trachea, thyroid gland and esophagus appear normal. Lungs/Pleura: No pleural effusion, airspace consolidation, atelectasis or pneumothorax. Bilateral peripheral interstitial reticulation is identified within the upper and lower lung zones. There is mild cylindrical bronchiectasis. No subpleural honeycombing. Right middle lobe lung nodule measures 4 mm, image 76/9. Scattered calcified granulomas. Musculoskeletal: No chest wall mass or suspicious bone lesions identified. CT ABDOMEN PELVIS FINDINGS Hepatobiliary: Multifocal, bilobar liver metastases identified. There is mass affect upon the intrahepatic bile ducts centrally with mild dilatation of the left and right intrahepatic bile ducts.  Normal caliber common bile duct. Index lesions include: -Conglomerate lesion involving segments 4A, 2, and 3 measures 16.7 x 9.6 cm, image 88/7. -lesion within segment 7 measures 5.2 x 3.2 cm, image 73/2. -lesion within segment 4 B measures 6.6 x 7.2 cm. Status post cholecystectomy. No common bile duct dilatation identified. Pancreas: Unremarkable. No pancreatic ductal dilatation or surrounding inflammatory changes. Spleen: Normal in size without focal abnormality. Adrenals/Urinary Tract: Normal adrenal glands. Several simple appearing right kidney cysts are identified. The largest arises off the inferior pole of the right kidney measuring 3.4 cm. No follow-up imaging recommended. No nephrolithiasis or obstructive uropathy. Urinary bladder appears normal. Stomach/Bowel: The stomach appears nondistended.  No pathologic dilatation of the large or small bowel loops. The appendix is visualized and appears normal. Distal colonic diverticulosis identified without signs of acute diverticulitis. No obstructing colon lesion identified. Vascular/Lymphatic: Aortic atherosclerosis. Patent upper abdominal vascularity. Liver tumor has mass effect upon the intrahepatic portal vein branches without signs of tumor thrombosis. Extrahepatic portal vein is patent. Aortocaval lymph node measures 1.3 cm, image 124/7. No pelvic or inguinal adenopathy. Reproductive: Prostate is unremarkable. Other: No ascites. No signs of peritoneal nodularity. No focal fluid collections. No signs of pneumoperitoneum. Musculoskeletal: No suspicious bone lesions. IMPRESSION: 1. Multifocal, bilobar liver metastases identified. Primary tumor is not apparent on current exam. 2. Enlarged aortocaval lymph node measures 1.3 cm. Suspicious for nodal metastasis. 3. Left supraclavicular lymph node measures 1 cm. Suspicious for nodal metastatic disease. 4. Right middle lobe lung nodule measures 4 mm. Nonspecific. Attention on future surveillance imaging is advised. 5.  Bilateral peripheral interstitial reticulation is identified within the upper and lower lung zones. There is mild cylindrical bronchiectasis. No subpleural honeycombing. Findings are favored to represent early interstitial lung disease. 6. Coronary artery calcifications. 7.  Aortic Atherosclerosis (ICD10-I70.0). Electronically Signed   By: Signa Kell M.D.   On: 03/21/2023 17:02   MR LIVER W WO CONTRAST  Result Date: 03/19/2023 CLINICAL DATA:  Abnormal liver on ultrasound EXAM: MRI ABDOMEN WITHOUT AND WITH CONTRAST TECHNIQUE: Multiplanar multisequence MR imaging of the abdomen was performed both before and after the administration of intravenous contrast. CONTRAST:  9 mL Vueway IV COMPARISON:  None Available. FINDINGS: Motion degraded images. Lower chest: Lung bases are clear. Hepatobiliary: Innumerable hepatic metastases. Index lesions include 6.9 cm lesion in segment 2 (series 15/image 49) and a 7.2 cm lesion in segment 8 (series 16/image 48). Status post cholecystectomy. No intrahepatic or extrahepatic duct dilatation. Pancreas:  Within normal limits. Spleen:  Within normal limits. Adrenals/Urinary Tract:  Adrenal glands are within normal limits. Bilateral renal cysts, including a dominant 3.4 cm cyst in the anterior right lower kidney (series 4/image 50) and left renal sinus cysts, benign (Bosniak I). No follow-up is recommended. No hydronephrosis. Stomach/Bowel: Stomach is within normal limits. Visualized bowel is unremarkable. Vascular/Lymphatic:  No evidence of abdominal aortic aneurysm. No suspicious abdominal lymphadenopathy. Other:  No abdominal ascites. Musculoskeletal: No focal osseous lesions. IMPRESSION: Innumerable hepatic metastases, as above. Primary tumor not identified on the current MR. Consider CT chest abdomen pelvis or PET-CT for staging, as clinically warranted. These results will be called to the ordering clinician or representative by the Radiologist Assistant, and communication  documented in the PACS or [employer]. Electronically Signed   By: Charline Bills M.D.   On: 03/19/2023 01:56    ASSESSMENT & PLAN:  88 year old male without history of malignancy except basal cell skin cancer, presented with fatigue, and imaging discovered innumerous liver metastasis.    Suspicion of Metastatic Cancer Diffuse liver lesions and enlarged lymph nodes on imaging, with slightly elevated alkaline phosphatase. No dominant lesion identified. No history of malignancy. Patient reports fatigue and weakness for six weeks, but no pain or significant weight loss. Liver is slightly enlarged on physical examination. -Order tumor markers and repeat liver and kidney function tests. -Schedule ultrasound-guided biopsy of left supraclavicular lymph nodes or liver within the next couple of weeks. -Encourage high protein diet and maintaining activity levels.  Depression and Anxiety History of depression and anxiety, previously managed with buspirone and bupropion. Buspirone has been discontinued and bupropion dose reduced to 150mg  daily. Patient denies current feelings of  depression. -Continue current management with bupropion 150mg  daily.  Atrial Fibrillation Managed with flecainide and Eliquis. -Continue current management with flecainide and Eliquis.  Hypertension Managed with amlodipine. -Continue current management with amlodipine.  Hyperlipidemia Managed with Lipitor. -Continue current management with Lipitor.  Plan -I reviewed his outside labs, and multiple images with patient and his daughter in detail -I recommend ultrasound-guided biopsy of left supraclavicular lymph node, if it does not feasible, then we will proceed to liver biopsy.  Order sent to IR -I plan to see him back 3 days after biopsy -Lab today for CBC, CMP and tumor markers.     Orders Placed This Encounter  Procedures   [place] CORE BIOPSY (LYMPH NODES)    Standing Status:   Future    Standing  Expiration Date:   03/28/2024    Order Specific Question:   Lab orders requested (DO NOT place separate lab orders, these will be automatically ordered during procedure specimen collection):    Answer:   Surgical Pathology    Order Specific Question:   Reason for Exam (SYMPTOM  OR DIAGNOSIS REQUIRED)    Answer:   US biopsy of left  node or liver lesion, confrim malignancy    Order Specific Question:   Preferred location?    Answer:   [place] [facility]   CBC with Differential/Platelet    Standing Status:   Standing    Number of Occurrences:   50    Standing Expiration Date:   03/28/2024   Cancer antigen 19-9    Standing Status:   Standing    Number of Occurrences:   20    Standing Expiration Date:   03/28/2024   Comprehensive metabolic panel    Standing Status:   Standing    Number of Occurrences:   50    Standing Expiration Date:   03/28/2024   Chromogranin A    Standing Status:   Standing    Number of Occurrences:   20    Standing Expiration Date:   03/28/2024   CEA (IN HOUSE-[facility])    Standing Status:   Standing    Number of Occurrences:   5    Standing Expiration Date:   03/28/2024   Prostate-Specific AG, Serum    Standing Status:   Standing    Number of Occurrences:   20    Standing Expiration Date:   03/28/2024   Chromogranin A    All questions were answered. The patient knows to call the clinic with any problems, questions or concerns. I spent 45 minutes counseling the patient face to face. The total time spent in the appointment was 60 minutes and more than 50% was on counseling.     [name], MD 03/29/2023

## 2023-03-30 ENCOUNTER — Telehealth: Payer: Self-pay | Admitting: Hematology

## 2023-03-30 ENCOUNTER — Telehealth: Payer: Self-pay

## 2023-03-30 LAB — PROSTATE-SPECIFIC AG, SERUM (LABCORP): Prostate Specific Ag, Serum: 1.1 ng/mL (ref 0.0–4.0)

## 2023-03-30 LAB — CHROMOGRANIN A: Chromogranin A (ng/mL): 200.5 ng/mL — ABNORMAL HIGH (ref 0.0–101.8)

## 2023-03-30 LAB — CANCER ANTIGEN 19-9: CA 19-9: 62 U/mL — ABNORMAL HIGH (ref 0–35)

## 2023-03-30 LAB — CEA (IN HOUSE-CHCC): CEA (CHCC-In House): 2.2 ng/mL (ref 0.00–5.00)

## 2023-03-30 NOTE — Telephone Encounter (Signed)
Pt advised with verbal understanding  °

## 2023-03-30 NOTE — Progress Notes (Signed)
Oley Balm, MD  Claudean Kinds PROCEDURE / BIOPSY REVIEW Date: 03/30/23  Requested Biopsy site: liver Reason for request: mets Imaging review: Best seen on CT 03/21/23  Decision: Approved Imaging modality to perform: Ultrasound Schedule with: Moderate Sedation Schedule for: Any VIR  Additional comments: @VIR : L supraclav node <1cm nonspecific, decreased yield for progs etc  Please contact me with questions, concerns, or if issue pertaining to this request arise.  Dayne Oley Balm, MD [facility]       Previous Messages    ----- Message ----- From: Claudean Kinds Sent: 03/30/2023   9:01 AM EDT To: Caroleen Hamman, NT; Ir Procedure Requests Subject: [place] Core Biopsy ( lymph nodes)                  Procedure: [place] Core  Biopsy (lymph nodes)  Reason: US biopsy of left  node or liver lesion, confrim malignancy Dx: Liver mass [R16.0 (ICD-10-CM)]    History: CT Chest Abdomen Pelvis w/, MR Liver w/wo, US abdomen limited RUQ  Provider: [name], MD  Provider Contact : [phone]

## 2023-03-30 NOTE — Progress Notes (Signed)
Chrystie Nose, MD  Claudean Kinds Yes .[name] Ok to hold Eliquis 2 days prior to the procedure - restart when safe after.  Dr Rennis Golden       Previous Messages    ----- Message ----- From: Claudean Kinds Sent: 03/30/2023  12:00 PM EDT To: Chrystie Nose, MD; Claudean Kinds Subject: Eliquis Hold request                          Hi Dr. Rennis Golden,  Patient has biopsy procedure scheduled for 04/20/23 - wanted to get the okay for patient to hold Eliquis 48 hrs prior to procedure.  Thanks , Gwen Sarvis X

## 2023-03-30 NOTE — Telephone Encounter (Addendum)
Called patient and relayed message below as per Dr. Mosetta Putt. Patient voiced full understanding.    ----- Message from [name] sent at 03/29/2023  8:50 PM EDT ----- Let pt know his Cr is slightly elevated and encourage him to drink more liquid. Thanks  [name]

## 2023-03-30 NOTE — Telephone Encounter (Signed)
Spoke with patient confirming upcoming appointment  

## 2023-03-30 NOTE — Telephone Encounter (Signed)
-----   Message from [name] sent at 03/29/2023  8:50 PM EDT ----- Let pt know his Cr is slightly elevated and encourage him to drink more liquid. Thanks  [name]

## 2023-04-03 ENCOUNTER — Telehealth: Payer: Self-pay | Admitting: Hematology

## 2023-04-04 ENCOUNTER — Other Ambulatory Visit: Payer: Self-pay | Admitting: *Deleted

## 2023-04-04 NOTE — Progress Notes (Signed)
The proposed treatment discussed in conference is for discussion purpose only and is not a binding recommendation.  The patients have not been physically examined, or presented with their treatment options.  Therefore, final treatment plans cannot be decided.  

## 2023-04-05 NOTE — Progress Notes (Signed)
Patient for US guided Liver Biopsy on Friday 04/06/23, I called and spoke with the patient's wife, Lanora Manis on the phone and gave pre-procedure instructions. Lanora Manis was made aware to have the patient here at 9a, last does of Eliquis was Tues 04/03/23, NPO after MN prior to procedure as well as driver post procedure/recovery/discharge. Lanora Manis stated understanding.  Called 04/05/2023

## 2023-04-06 ENCOUNTER — Other Ambulatory Visit: Payer: Self-pay

## 2023-04-06 ENCOUNTER — Ambulatory Visit
Admission: RE | Admit: 2023-04-06 | Discharge: 2023-04-06 | Disposition: A | Discharge status: Home / Self Care | Payer: Medicare Other | Source: Ambulatory Visit | Attending: Hematology

## 2023-04-06 VITALS — BP 104/62 | HR 66 | Temp 97.8°F | Resp 16 | Ht 72.0 in | Wt 200.0 lb

## 2023-04-06 DIAGNOSIS — K769 Liver disease, unspecified: Secondary | ICD-10-CM | POA: Diagnosis not present

## 2023-04-06 DIAGNOSIS — K5730 Diverticulosis of large intestine without perforation or abscess without bleeding: Secondary | ICD-10-CM | POA: Insufficient documentation

## 2023-04-06 DIAGNOSIS — C787 Secondary malignant neoplasm of liver and intrahepatic bile duct: Secondary | ICD-10-CM | POA: Insufficient documentation

## 2023-04-06 DIAGNOSIS — R160 Hepatomegaly, not elsewhere classified: Secondary | ICD-10-CM | POA: Insufficient documentation

## 2023-04-06 DIAGNOSIS — I10 Essential (primary) hypertension: Secondary | ICD-10-CM | POA: Diagnosis not present

## 2023-04-06 DIAGNOSIS — Z87891 Personal history of nicotine dependence: Secondary | ICD-10-CM | POA: Diagnosis not present

## 2023-04-06 LAB — PROTIME-INR
INR: 1.1 (ref 0.8–1.2)
Prothrombin Time: 14.1 s (ref 11.4–15.2)

## 2023-04-06 LAB — CBC
HCT: 43.9 % (ref 39.0–52.0)
Hemoglobin: 14.3 g/dL (ref 13.0–17.0)
MCH: 30.4 pg (ref 26.0–34.0)
MCHC: 32.6 g/dL (ref 30.0–36.0)
MCV: 93.2 fL (ref 80.0–100.0)
Platelets: 331 K/uL (ref 150–400)
RBC: 4.71 MIL/uL (ref 4.22–5.81)
RDW: 13.3 % (ref 11.5–15.5)
WBC: 10.3 K/uL (ref 4.0–10.5)
nRBC: 0 % (ref 0.0–0.2)

## 2023-04-06 MED ORDER — MIDAZOLAM HCL 2 MG/2ML IJ SOLN
INTRAMUSCULAR | Status: AC | PRN
Start: 2023-04-06 — End: 2023-04-06
  Administered 2023-04-06: 1 mg via INTRAVENOUS
  Administered 2023-04-06: .5 mg via INTRAVENOUS

## 2023-04-06 MED ORDER — FENTANYL CITRATE (PF) 100 MCG/2ML IJ SOLN
INTRAMUSCULAR | Status: AC
  Filled 2023-04-06: qty 2

## 2023-04-06 MED ORDER — MIDAZOLAM HCL 2 MG/2ML IJ SOLN
INTRAMUSCULAR | Status: AC
  Filled 2023-04-06: qty 2

## 2023-04-06 MED ORDER — LIDOCAINE HCL (PF) 1 % IJ SOLN
10.0000 mL | Freq: Once | INTRAMUSCULAR | Status: AC
  Administered 2023-04-06: 10 mL via INTRADERMAL
  Filled 2023-04-06: qty 10

## 2023-04-06 MED ORDER — FENTANYL CITRATE (PF) 100 MCG/2ML IJ SOLN
INTRAMUSCULAR | Status: AC | PRN
  Administered 2023-04-06: 25 ug via INTRAVENOUS
  Administered 2023-04-06: 50 ug via INTRAVENOUS

## 2023-04-06 NOTE — H&P (Signed)
Chief Complaint: Patient was seen in consultation today for liver mass  Referring Physician(s): Feng,Yan  Supervising Physician: Pernell Dupre  Patient Status: [facility] - Out-pt  History of Present Illness: Corey [name] is a 88 y.o. male with PMH significant for atrial fibrillation, fatigue, hearing loss, and hypertension being seen today in relation to newly discovered liver mass. The patient is being followed by Dr Mosetta Putt from Oncology service. Patient had abdominal US on 9/30 which revealed suspicion for possible masses. Follow-up liver MRI on 10/11 was obtained which revealed numerous hepatic metastases. CT CAP on 10/23 confirmed above findings, but was unable to identify a suspected primary tumor. Patient was referred to IR for evaluation for possible biopsy.  Past Medical History:  Diagnosis Date   Basal cell carcinoma of auricle of right ear    BPH (benign prostatic hyperplasia)    DJD (degenerative joint disease)    left   ED (erectile dysfunction)    Fatigue    Hearing loss, sensorineural    Hx of adenomatous colonic polyps    Hx of major depression    Hypertension    Iron deficiency anemia    Nephrolithiasis    Paroxysmal atrial fibrillation (HCC)    Seasonal allergic rhinitis     Past Surgical History:  Procedure Laterality Date   CARDIOVERSION N/A 08/13/2015   Procedure: CARDIOVERSION;  Surgeon: Chrystie Nose, MD;  Location: [facility] ENDOSCOPY;  Service: Cardiovascular;  Laterality: N/A;   CARDIOVERSION N/A 04/04/2016   Procedure: CARDIOVERSION;  Surgeon: Quintella Reichert, MD;  Location: [facility] ENDOSCOPY;  Service: Cardiovascular;  Laterality: N/A;   CARDIOVERSION N/A 11/12/2017   Procedure: CARDIOVERSION;  Surgeon: Chrystie Nose, MD;  Location: [facility] ENDOSCOPY;  Service: Cardiovascular;  Laterality: N/A;   CHOLECYSTECTOMY     colonscopy     herniorraphy Bilateral    had surgery x2 on the left  and right   TONSILLECTOMY AND ADENOIDECTOMY       Allergies: Patient has no known allergies.  Medications: Prior to Admission medications   Medication Sig Start Date End Date Taking? Authorizing Provider  amLODipine (NORVASC) 2.5 MG tablet Take 1 tablet (2.5 mg total) by mouth daily. 09/18/22  Yes Hilty, Lisette Abu, MD  atorvastatin (LIPITOR) 20 MG tablet Take 20 mg by mouth daily.   Yes [provider]  buPROPion (WELLBUTRIN SR) 150 MG 12 hr tablet Take 150 mg by mouth daily.   Yes [provider]  flecainide (TAMBOCOR) 100 MG tablet Take 1 tablet (100 mg total) by mouth 2 (two) times daily. 07/10/22  Yes Hilty, Lisette Abu, MD  LORazepam (ATIVAN) 0.5 MG tablet Take 0.5 mg by mouth daily.   Yes [provider]  mirtazapine (REMERON) 30 MG tablet Take 30 mg by mouth daily.   Yes [provider]  valsartan-hydrochlorothiazide (DIOVAN-HCT) 160-12.5 MG tablet Take 1 tablet by mouth daily. 09/15/21  Yes [provider]  apixaban (ELIQUIS) 5 MG TABS tablet Take 5 mg by mouth 2 (two) times daily. 07/14/15   [provider]  ferrous sulfate 324 MG TBEC Take 324 mg by mouth daily with breakfast. Patient not taking: Reported on 04/06/2023    [provider]  vitamin B-12 (CYANOCOBALAMIN) 500 MCG tablet Take 500 mcg by mouth daily.    [provider]     Family History  Problem Relation Age of Onset   CAD Mother    Heart disease Mother    Heart attack Father    Cancer -  Prostate Brother    Alcoholism Daughter    Arrhythmia Daughter    Healthy Son    Healthy Daughter     Social History   Socioeconomic History   Marital status: Married    Spouse name: Not on file   Number of children: Not on file   Years of education: Not on file   Highest education level: Not on file  Occupational History   Not on file  Tobacco Use   Smoking status: Former    Current packs/day: 0.00    Average packs/day: 1 pack/day for 1.5 years (1.5 ttl pk-yrs)    Types: Cigarettes    Start  date: 01/15/1959    Quit date: 07/15/1960    Years since quitting: 62.7   Smokeless tobacco: Never  Vaping Use   Vaping status: Never Used  Substance and Sexual Activity   Alcohol use: No   Drug use: Never   Sexual activity: Yes  Other Topics Concern   Not on file  Social History Narrative   Lives with wife, Thurston Hole. No indoor pets.    Social Determinants of Health   Financial Resource Strain: Not on file  Food Insecurity: Not on file  Transportation Needs: Not on file  Physical Activity: Not on file  Stress: Not on file  Social Connections: Not on file    Code Status: Full code  Review of Systems: A 12 point ROS discussed and pertinent positives are indicated in the HPI above.  All other systems are negative.  Review of Systems  Constitutional:  Positive for fatigue. Negative for chills and fever.  Respiratory:  Negative for chest tightness and shortness of breath.   Cardiovascular:  Negative for chest pain and leg swelling.  Gastrointestinal:  Negative for abdominal pain, diarrhea, nausea and vomiting.  Neurological:  Negative for dizziness and headaches.  Psychiatric/Behavioral:  Negative for confusion.     Vital Signs: BP (!) 162/74   Pulse 66   Temp 97.8 F (36.6 C) (Oral)   Resp 20   Ht 6' (1.829 m)   Wt 200 lb (90.7 kg)   SpO2 98%   BMI 27.12 kg/m    Physical Exam Vitals reviewed.  Constitutional:      General: He is not in acute distress.    Appearance: He is not ill-appearing.  Cardiovascular:     Rate and Rhythm: Normal rate and regular rhythm.     Pulses: Normal pulses.     Heart sounds: Normal heart sounds.  Pulmonary:     Effort: Pulmonary effort is normal.     Breath sounds: Normal breath sounds.  Abdominal:     Palpations: Abdomen is soft.     Tenderness: There is no abdominal tenderness.  Musculoskeletal:     Right lower leg: No edema.     Left lower leg: No edema.  Skin:    General: Skin is warm and dry.  Neurological:     Mental  Status: He is alert and oriented to person, place, and time.  Psychiatric:        Mood and Affect: Mood normal.        Behavior: Behavior normal.        Thought Content: Thought content normal.        Judgment: Judgment normal.     Imaging: CT CHEST ABDOMEN PELVIS W CONTRAST  Result Date: 03/21/2023 CLINICAL DATA:  Metastatic disease evaluation. * Tracking Code: BO * EXAM: CT CHEST, ABDOMEN, AND PELVIS WITH CONTRAST TECHNIQUE: Multidetector  CT imaging of the chest, abdomen and pelvis was performed following the standard protocol during bolus administration of intravenous contrast. RADIATION DOSE REDUCTION: This exam was performed according to the departmental dose-optimization program which includes automated exposure control, adjustment of the mA and/or kV according to patient size and/or use of iterative reconstruction technique. CONTRAST:  OMNIPAQUE IOHEXOL 300 MG/ML  SOLN COMPARISON:  Abdominal MRI 03/09/2023 FINDINGS: CT CHEST FINDINGS Cardiovascular: Heart size is within normal limits. Aortic atherosclerosis and coronary artery calcifications. No pericardial effusion. Mediastinum/Nodes: Left supraclavicular lymph node measures 1 cm, image 7/7. No enlarged mediastinal or hilar lymph nodes. No axillary adenopathy. The trachea, thyroid gland and esophagus appear normal. Lungs/Pleura: No pleural effusion, airspace consolidation, atelectasis or pneumothorax. Bilateral peripheral interstitial reticulation is identified within the upper and lower lung zones. There is mild cylindrical bronchiectasis. No subpleural honeycombing. Right middle lobe lung nodule measures 4 mm, image 76/9. Scattered calcified granulomas. Musculoskeletal: No chest wall mass or suspicious bone lesions identified. CT ABDOMEN PELVIS FINDINGS Hepatobiliary: Multifocal, bilobar liver metastases identified. There is mass affect upon the intrahepatic bile ducts centrally with mild dilatation of the left and right intrahepatic  bile ducts. Normal caliber common bile duct. Index lesions include: -Conglomerate lesion involving segments 4A, 2, and 3 measures 16.7 x 9.6 cm, image 88/7. -lesion within segment 7 measures 5.2 x 3.2 cm, image 73/2. -lesion within segment 4 B measures 6.6 x 7.2 cm. Status post cholecystectomy. No common bile duct dilatation identified. Pancreas: Unremarkable. No pancreatic ductal dilatation or surrounding inflammatory changes. Spleen: Normal in size without focal abnormality. Adrenals/Urinary Tract: Normal adrenal glands. Several simple appearing right kidney cysts are identified. The largest arises off the inferior pole of the right kidney measuring 3.4 cm. No follow-up imaging recommended. No nephrolithiasis or obstructive uropathy. Urinary bladder appears normal. Stomach/Bowel: The stomach appears nondistended. No pathologic dilatation of the large or small bowel loops. The appendix is visualized and appears normal. Distal colonic diverticulosis identified without signs of acute diverticulitis. No obstructing colon lesion identified. Vascular/Lymphatic: Aortic atherosclerosis. Patent upper abdominal vascularity. Liver tumor has mass effect upon the intrahepatic portal vein branches without signs of tumor thrombosis. Extrahepatic portal vein is patent. Aortocaval lymph node measures 1.3 cm, image 124/7. No pelvic or inguinal adenopathy. Reproductive: Prostate is unremarkable. Other: No ascites. No signs of peritoneal nodularity. No focal fluid collections. No signs of pneumoperitoneum. Musculoskeletal: No suspicious bone lesions. IMPRESSION: 1. Multifocal, bilobar liver metastases identified. Primary tumor is not apparent on current exam. 2. Enlarged aortocaval lymph node measures 1.3 cm. Suspicious for nodal metastasis. 3. Left supraclavicular lymph node measures 1 cm. Suspicious for nodal metastatic disease. 4. Right middle lobe lung nodule measures 4 mm. Nonspecific. Attention on future surveillance imaging is  advised. 5. Bilateral peripheral interstitial reticulation is identified within the upper and lower lung zones. There is mild cylindrical bronchiectasis. No subpleural honeycombing. Findings are favored to represent early interstitial lung disease. 6. Coronary artery calcifications. 7.  Aortic Atherosclerosis (ICD10-I70.0). Electronically Signed   By: Signa Kell M.D.   On: 03/21/2023 17:02   MR LIVER W WO CONTRAST  Result Date: 03/19/2023 CLINICAL DATA:  Abnormal liver on ultrasound EXAM: MRI ABDOMEN WITHOUT AND WITH CONTRAST TECHNIQUE: Multiplanar multisequence MR imaging of the abdomen was performed both before and after the administration of intravenous contrast. CONTRAST:  9 mL Vueway IV COMPARISON:  None Available. FINDINGS: Motion degraded images. Lower chest: Lung bases are clear. Hepatobiliary: Innumerable hepatic metastases. Index lesions include 6.9 cm lesion in  segment 2 (series 15/image 49) and a 7.2 cm lesion in segment 8 (series 16/image 48). Status post cholecystectomy. No intrahepatic or extrahepatic duct dilatation. Pancreas:  Within normal limits. Spleen:  Within normal limits. Adrenals/Urinary Tract:  Adrenal glands are within normal limits. Bilateral renal cysts, including a dominant 3.4 cm cyst in the anterior right lower kidney (series 4/image 50) and left renal sinus cysts, benign (Bosniak I). No follow-up is recommended. No hydronephrosis. Stomach/Bowel: Stomach is within normal limits. Visualized bowel is unremarkable. Vascular/Lymphatic:  No evidence of abdominal aortic aneurysm. No suspicious abdominal lymphadenopathy. Other:  No abdominal ascites. Musculoskeletal: No focal osseous lesions. IMPRESSION: Innumerable hepatic metastases, as above. Primary tumor not identified on the current MR. Consider CT chest abdomen pelvis or PET-CT for staging, as clinically warranted. These results will be called to the ordering clinician or representative by the Radiologist Assistant, and  communication documented in the PACS or [employer]. Electronically Signed   By: Charline Bills M.D.   On: 03/19/2023 01:56    Labs:  CBC: Recent Labs    03/29/23 1530 04/06/23 0841  WBC 8.3 10.3  HGB 13.5 14.3  HCT 41.6 43.9  PLT 345 331    COAGS: Recent Labs    04/06/23 0841  INR 1.1    BMP: Recent Labs    03/29/23 1530  NA 136  K 4.1  CL 99  CO2 30  GLUCOSE 184*  BUN 24*  CALCIUM 9.3  CREATININE 1.25*  GFRNONAA 56*    LIVER FUNCTION TESTS: Recent Labs    03/29/23 1530  BILITOT 1.1  AST 47*  ALT 49*  ALKPHOS 577*  PROT 7.2  ALBUMIN 3.7    TUMOR MARKERS: No results for input(s): "AFPTM", "CEA", "CA199", "CHROMGRNA" in the last 8760 hours.  Assessment and Plan:  Fraser Vlahos is an 88 yo male being seen today in relation to newly discovered liver masses. Patient is under the care of Dr Mosetta Putt from Oncology service. A tissue diagnosis to determine primary type is desired, and patient subsequently referred to IR for image-guided liver biopsy. Case reviewed with Dr Juliette Alcide this AM. Patient is NPO and presents today in his usual state of health.  Risks and benefits of image-guided liver biopsy was discussed with the patient and/or patient's family including, but not limited to bleeding, infection, damage to adjacent structures or low yield requiring additional tests.  All of the questions were answered and there is agreement to proceed.  Consent signed and in chart.   Thank you for this interesting consult.  I greatly enjoyed meeting MEREK KASPARIAN and look forward to participating in their care.  A copy of this report was sent to the requesting provider on this date.  Electronically Signed: Kennieth Francois, PA-C 04/06/2023, 9:20 AM   I spent a total of  15 Minutes   in face to face in clinical consultation, greater than 50% of which was counseling/coordinating care for liver mass.

## 2023-04-06 NOTE — Procedures (Signed)
Interventional Radiology Procedure Note  Date of Procedure: 04/06/2023  Procedure: Liver lesion core needle biopsy   Findings:  1. Left liver lesion CNB 18ga x4 cores    Complications: No immediate complications noted.   Estimated Blood Loss: minimal  Follow-up and Recommendations: 1. Bedrest 2 hours    Olive Bass, MD  Vascular & Interventional Radiology  04/06/2023 10:19 AM

## 2023-04-06 NOTE — Progress Notes (Signed)
Patient clinically stable post post US liver biopsy per Dr Juliette Alcide, tolerated well. Vitals stable pre and post procedure. Received Versed 1.5 mg along with Fentanyl 75 mcg IV for procedure. Report given to Moreen Fowler RN post procedure/specials/22

## 2023-04-10 ENCOUNTER — Encounter: Payer: Self-pay | Admitting: Hematology

## 2023-04-10 ENCOUNTER — Other Ambulatory Visit: Payer: Self-pay

## 2023-04-10 ENCOUNTER — Inpatient Hospital Stay: Payer: Medicare Other | Attending: Hematology | Admitting: Hematology

## 2023-04-10 VITALS — BP 150/88 | HR 71 | Temp 97.8°F | Resp 18 | Wt 197.5 lb

## 2023-04-10 DIAGNOSIS — C7A8 Other malignant neuroendocrine tumors: Secondary | ICD-10-CM | POA: Diagnosis not present

## 2023-04-10 DIAGNOSIS — C7B8 Other secondary neuroendocrine tumors: Secondary | ICD-10-CM | POA: Diagnosis not present

## 2023-04-10 LAB — SURGICAL PATHOLOGY

## 2023-04-10 NOTE — Progress Notes (Signed)
[facility]   [phone] [phone]   Clinic Follow up Note   Patient Care Team: Pcp, No as PCP - General Hilty, Lisette Abu, MD as PCP - Cardiology (Cardiology) [name], MD as Consulting Physician (Hematology and Oncology)  Date of Service:  04/10/2023  CHIEF COMPLAINT: f/u of liver biopsy results  CURRENT THERAPY:  Pending  Assessment and plan  Metastatic malignant neuroendocrine tumor to liver [facility]) -Stage IV with diffuse liver metastasis, unknown primary. -diagnosed in 03/2023 -Patient presents with progressive weakness over a year, but still able to function well at home. -Due to the abnormal liver function, patient underwent ultrasound, CT and MRI scan of abdomen which showed diffuse liver metastasis, and slightly enlarged left supraclavicular and aortocaval lymph nodes. -Patient underwent a liver biopsy on April 06, 2023.  It showed well-differentiated neuroendocrine tumor, grade 1, less than 1 mitosis per 2 mm, 67 less than 3% -I recommend dotatate PET scan for staging and looking for primary,  and see if he is a candidate for Lutathera treatment -Patient does not have diarrhea or flushing, may not benefit much from Sandostatin injection. -If his dotatate PET scan is positive, I recommend Lutathera treatment as first-line therapy, to the bulky diffuse liver metastasis and his symptom of fatigue.  I discussed the benefit and potential side effects, and the logistics of the therapy.  He is interested.   Plan -Dotatate PET scan in the next few weeks -Follow-up after PET scan           Discussed the use of AI scribe software for clinical note transcription with the patient, who gave verbal consent to proceed.  History of Present Illness       Patient is here for follow-up.  He presents to the clinic with his wife and daughter, his son was also on the phone during his visit.  No significant change since his initial visit 2 weeks  ago.  He underwent a liver biopsy last week and tolerated well.  His main concern is fatigue, but he is still able to function well at home.  He denies any pain or other new symptoms.   All other systems were reviewed with the patient and are negative.  MEDICAL HISTORY:  Past Medical History:  Diagnosis Date   Basal cell carcinoma of auricle of right ear    BPH (benign prostatic hyperplasia)    DJD (degenerative joint disease)    left   ED (erectile dysfunction)    Fatigue    Hearing loss, sensorineural    Hx of adenomatous colonic polyps    Hx of major depression    Hypertension    Iron deficiency anemia    Nephrolithiasis    Paroxysmal atrial fibrillation (HCC)    Seasonal allergic rhinitis     SURGICAL HISTORY: Past Surgical History:  Procedure Laterality Date   CARDIOVERSION N/A 08/13/2015   Procedure: CARDIOVERSION;  Surgeon: Chrystie Nose, MD;  Location: [facility] ENDOSCOPY;  Service: Cardiovascular;  Laterality: N/A;   CARDIOVERSION N/A 04/04/2016   Procedure: CARDIOVERSION;  Surgeon: Quintella Reichert, MD;  Location: [facility] ENDOSCOPY;  Service: Cardiovascular;  Laterality: N/A;   CARDIOVERSION N/A 11/12/2017   Procedure: CARDIOVERSION;  Surgeon: Chrystie Nose, MD;  Location: [facility] ENDOSCOPY;  Service: Cardiovascular;  Laterality: N/A;   CHOLECYSTECTOMY     colonscopy     herniorraphy Bilateral    had surgery x2 on the left  and right   TONSILLECTOMY AND ADENOIDECTOMY  I have reviewed the social history and family history with the patient and they are unchanged from previous note.  ALLERGIES:  has No Known Allergies.  MEDICATIONS:  Current Outpatient Medications  Medication Sig Dispense Refill   amLODipine (NORVASC) 2.5 MG tablet Take 1 tablet (2.5 mg total) by mouth daily. 90 tablet 3   apixaban (ELIQUIS) 5 MG TABS tablet Take 5 mg by mouth 2 (two) times daily.     atorvastatin (LIPITOR) 20 MG tablet Take 20 mg by mouth daily.     buPROPion (WELLBUTRIN SR) 150 MG 12 hr  tablet Take 150 mg by mouth daily.     flecainide (TAMBOCOR) 100 MG tablet Take 1 tablet (100 mg total) by mouth 2 (two) times daily. 90 tablet 1   LORazepam (ATIVAN) 0.5 MG tablet Take 0.5 mg by mouth daily.     mirtazapine (REMERON) 30 MG tablet Take 15 mg by mouth daily.     valsartan-hydrochlorothiazide (DIOVAN-HCT) 160-12.5 MG tablet Take 1 tablet by mouth daily.     vitamin B-12 (CYANOCOBALAMIN) 500 MCG tablet Take 500 mcg by mouth daily. Patient is taking it 3 times a week     No current facility-administered medications for this visit.    PHYSICAL EXAMINATION: ECOG PERFORMANCE STATUS: 1 - Symptomatic but completely ambulatory  Vitals:   04/10/23 1002  BP: (!) 150/88  Pulse: 71  Resp: 18  Temp: 97.8 F (36.6 C)  SpO2: 96%   Wt Readings from Last 3 Encounters:  04/10/23 197 lb 8 oz (89.6 kg)  04/06/23 200 lb (90.7 kg)  03/29/23 200 lb (90.7 kg)     GENERAL:alert, no distress and comfortable SKIN: skin color, texture, turgor are normal, no rashes or significant lesions EYES: normal, Conjunctiva are pink and non-injected, sclera clear Musculoskeletal:no cyanosis of digits and no clubbing  NEURO: alert & oriented x 3 with fluent speech, no focal motor/sensory deficits   LABORATORY DATA:  I have reviewed the data as listed    Latest Ref Rng & Units 04/06/2023    8:41 AM 03/29/2023    3:30 PM 12/18/2019    3:52 PM  CBC  WBC 4.0 - 10.5 K/uL 10.3  8.3  9.1   Hemoglobin 13.0 - 17.0 g/dL 16.1  09.6  04.5   Hematocrit 39.0 - 52.0 % 43.9  41.6  45.5   Platelets 150 - 400 K/uL 331  345  306         Latest Ref Rng & Units 03/29/2023    3:30 PM 12/18/2019    3:52 PM 11/28/2019    6:30 AM  CMP  Glucose 70 - 99 mg/dL 409  811  914   BUN 8 - 23 mg/dL 24  12  18    Creatinine 0.61 - 1.24 mg/dL 7.82  9.56  2.13   Sodium 135 - 145 mmol/L 136  135  141   Potassium 3.5 - 5.1 mmol/L 4.1  4.4  5.1   Chloride 98 - 111 mmol/L 99  97  107   CO2 22 - 32 mmol/L 30  28  26    Calcium  8.9 - 10.3 mg/dL 9.3  9.3  8.9   Total Protein 6.5 - 8.1 g/dL 7.2   6.1   Total Bilirubin 0.3 - 1.2 mg/dL 1.1   1.0   Alkaline Phos 38 - 126 U/L 577   58   AST 15 - 41 U/L 47   15   ALT 0 - 44 U/L 49  17       RADIOGRAPHIC STUDIES: I have personally reviewed the radiological images as listed and agreed with the findings in the report. No results found.    Orders Placed This Encounter  Procedures   NM PET DOTATATE SKULL BASE TO MID THIGH    Standing Status:   Future    Standing Expiration Date:   04/09/2024    Order Specific Question:   If indicated for the ordered procedure, I authorize the administration of a radiopharmaceutical per Radiology protocol    Answer:   Yes    Order Specific Question:   Preferred imaging location?    Answer:   [facility], [place]   All questions were answered. The patient knows to call the clinic with any problems, questions or concerns. No barriers to learning was detected. The total time spent in the appointment was 40 minutes.     [name], MD 04/10/2023

## 2023-04-10 NOTE — Assessment & Plan Note (Signed)
-  Stage IV with diffuse liver metastasis, unknown primary. -diagnosed in 03/2023 -Patient presents with progressive weakness over a year, but still able to function well at home. -Due to the abnormal liver function, patient underwent ultrasound, CT and MRI scan of abdomen which showed diffuse liver metastasis, and slightly enlarged left supraclavicular and aortocaval lymph nodes. -Patient underwent a liver biopsy on April 06, 2023.  It showed well-differentiated neuroendocrine tumor, grade 1, less than 1 mitosis per 2 mm, 67 less than 3% -I recommend dotatate PET scan for staging and looking for primary,  and see if he is a candidate for Lutathera treatment -Patient does not have diarrhea or flushing, may not benefit much from Sandostatin injection. -If his dotatate PET scan is positive, I recommend Lutathera treatment as first-line therapy, to the bulky diffuse liver metastasis and his symptom of fatigue.  I discussed the benefit and potential side effects, and the logistics of the therapy.  He is interested.

## 2023-04-12 ENCOUNTER — Telehealth: Payer: Self-pay | Admitting: Hematology

## 2023-04-12 ENCOUNTER — Other Ambulatory Visit (HOSPITAL_COMMUNITY): Payer: Self-pay | Admitting: Internal Medicine

## 2023-04-16 ENCOUNTER — Ambulatory Visit
Admission: RE | Admit: 2023-04-16 | Discharge: 2023-04-16 | Disposition: A | Discharge status: Home / Self Care | Payer: Medicare Other | Source: Ambulatory Visit | Attending: Hematology | Admitting: Hematology

## 2023-04-16 DIAGNOSIS — R590 Localized enlarged lymph nodes: Secondary | ICD-10-CM | POA: Insufficient documentation

## 2023-04-16 DIAGNOSIS — C7B8 Other secondary neuroendocrine tumors: Secondary | ICD-10-CM | POA: Insufficient documentation

## 2023-04-16 DIAGNOSIS — C787 Secondary malignant neoplasm of liver and intrahepatic bile duct: Secondary | ICD-10-CM | POA: Diagnosis not present

## 2023-04-16 DIAGNOSIS — Z85828 Personal history of other malignant neoplasm of skin: Secondary | ICD-10-CM | POA: Diagnosis not present

## 2023-04-16 DIAGNOSIS — I700 Atherosclerosis of aorta: Secondary | ICD-10-CM | POA: Insufficient documentation

## 2023-04-16 MED ORDER — COPPER CU 64 DOTATATE 1 MCI/ML IV SOLN
4.0000 | Freq: Once | INTRAVENOUS | Status: AC
  Administered 2023-04-16: 4.2 via INTRAVENOUS

## 2023-04-20 ENCOUNTER — Ambulatory Visit (HOSPITAL_COMMUNITY): Payer: Medicare Other

## 2023-04-22 NOTE — Assessment & Plan Note (Signed)
-  Stage IV with diffuse liver metastasis, unknown primary. -diagnosed in 03/2023 -Patient presents with progressive weakness over a year, but still able to function well at home. -Due to the abnormal liver function, patient underwent ultrasound, CT and MRI scan of abdomen which showed diffuse liver metastasis, and slightly enlarged left supraclavicular and aortocaval lymph nodes. -Patient underwent a liver biopsy on April 06, 2023.  It showed well-differentiated neuroendocrine tumor, grade 1, less than 1 mitosis per 2 mm, 67 less than 3% -I recommend dotatate PET scan for staging and looking for primary,  and see if he is a candidate for Lutathera treatment -Patient does not have diarrhea or flushing, may not benefit much from Sandostatin injection. -If his dotatate PET scan is positive, I recommend Lutathera treatment as first-line therapy, to the bulky diffuse liver metastasis and his symptom of fatigue.  I discussed the benefit and potential side effects, and the logistics of the therapy.  He is interested.

## 2023-04-23 ENCOUNTER — Encounter: Payer: Self-pay | Admitting: Hematology

## 2023-04-23 ENCOUNTER — Inpatient Hospital Stay: Payer: Medicare Other | Admitting: Hematology

## 2023-04-23 ENCOUNTER — Inpatient Hospital Stay: Payer: Medicare Other

## 2023-04-23 ENCOUNTER — Other Ambulatory Visit: Payer: Self-pay

## 2023-04-23 VITALS — BP 123/76 | HR 76 | Temp 97.4°F | Resp 17 | Ht 72.0 in | Wt 197.0 lb

## 2023-04-23 DIAGNOSIS — C7B8 Other secondary neuroendocrine tumors: Secondary | ICD-10-CM | POA: Diagnosis not present

## 2023-04-23 DIAGNOSIS — R160 Hepatomegaly, not elsewhere classified: Secondary | ICD-10-CM

## 2023-04-23 DIAGNOSIS — C7A8 Other malignant neuroendocrine tumors: Secondary | ICD-10-CM | POA: Diagnosis not present

## 2023-04-23 LAB — CBC WITH DIFFERENTIAL/PLATELET
Abs Immature Granulocytes: 0.07 10*3/uL (ref 0.00–0.07)
Basophils Absolute: 0.1 10*3/uL (ref 0.0–0.1)
Basophils Relative: 1 %
Eosinophils Absolute: 0.1 10*3/uL (ref 0.0–0.5)
Eosinophils Relative: 0 %
HCT: 45.4 % (ref 39.0–52.0)
Hemoglobin: 15.4 g/dL (ref 13.0–17.0)
Immature Granulocytes: 1 %
Lymphocytes Relative: 9 %
Lymphs Abs: 1.1 10*3/uL (ref 0.7–4.0)
MCH: 31.4 pg (ref 26.0–34.0)
MCHC: 33.9 g/dL (ref 30.0–36.0)
MCV: 92.5 fL (ref 80.0–100.0)
Monocytes Absolute: 1.4 10*3/uL — ABNORMAL HIGH (ref 0.1–1.0)
Monocytes Relative: 12 %
Neutro Abs: 8.6 10*3/uL — ABNORMAL HIGH (ref 1.7–7.7)
Neutrophils Relative %: 77 %
Platelets: 352 10*3/uL (ref 150–400)
RBC: 4.91 MIL/uL (ref 4.22–5.81)
RDW: 13.6 % (ref 11.5–15.5)
WBC: 11.3 10*3/uL — ABNORMAL HIGH (ref 4.0–10.5)
nRBC: 0 % (ref 0.0–0.2)

## 2023-04-23 LAB — COMPREHENSIVE METABOLIC PANEL
ALT: 66 U/L — ABNORMAL HIGH (ref 0–44)
AST: 87 U/L — ABNORMAL HIGH (ref 15–41)
Albumin: 4.1 g/dL (ref 3.5–5.0)
Alkaline Phosphatase: 782 U/L — ABNORMAL HIGH (ref 38–126)
Anion gap: 9 (ref 5–15)
BUN: 22 mg/dL (ref 8–23)
CO2: 32 mmol/L (ref 22–32)
Calcium: 9.9 mg/dL (ref 8.9–10.3)
Chloride: 95 mmol/L — ABNORMAL LOW (ref 98–111)
Creatinine, Ser: 1.13 mg/dL (ref 0.61–1.24)
GFR, Estimated: >60 mL/min (ref >60)
Glucose, Bld: 119 mg/dL — ABNORMAL HIGH (ref 70–99)
Potassium: 3.9 mmol/L (ref 3.5–5.1)
Sodium: 136 mmol/L (ref 135–145)
Total Bilirubin: 2 mg/dL — ABNORMAL HIGH (ref <1.2)
Total Protein: 8.4 g/dL — ABNORMAL HIGH (ref 6.5–8.1)

## 2023-04-23 NOTE — Progress Notes (Signed)
[facility]   [phone] Fax:([phone]) [phone]   Clinic Follow up Note   Patient Care Team: Pcp, No as PCP - General Hilty, Lisette Abu, MD as PCP - Cardiology (Cardiology) [name], MD as Consulting Physician (Hematology and Oncology)  Date of Service:  04/23/2023  CHIEF COMPLAINT: f/u of metastatic neuroendocrine tumor  CURRENT THERAPY:  Pending Lutathera  Oncology History   Metastatic malignant neuroendocrine tumor to liver [facility]) -Stage IV with diffuse liver metastasis, unknown primary. -diagnosed in 03/2023 -Patient presents with progressive weakness over a year, but still able to function well at home. -Due to the abnormal liver function, patient underwent ultrasound, CT and MRI scan of abdomen which showed diffuse liver metastasis, and slightly enlarged left supraclavicular and aortocaval lymph nodes. -Patient underwent a liver biopsy on April 06, 2023.  It showed well-differentiated neuroendocrine tumor, grade 1, less than 1 mitosis per 2 mm, 67 less than 3% -I recommend dotatate PET scan for staging and looking for primary,  and see if he is a candidate for Lutathera treatment -Patient does not have diarrhea or flushing, may not benefit much from Sandostatin injection. -If his dotatate PET scan is positive, I recommend Lutathera treatment as first-line therapy, to the bulky diffuse liver metastasis and his symptom of fatigue.  I discussed the benefit and potential side effects, and the logistics of the therapy.  He is interested.    Assessment and Plan    Metastatic Neuroendocrine Tumor [age]-year-old with metastatic neuroendocrine tumor confirmed by PET scan, showing metastasis to the liver, chest lymph nodes, and a small lesion in the right low cervix bone. Asymptomatic for typical symptoms like diarrhea or flushing. Tumor is treatable but not curable. Recommended Lutathera (lutetium Lu 177 dotatate) therapy, involving four treatments, each  two months apart. Discussed risks including leukemia and myelodysplastic syndromes (MDS). Blood counts will be monitored before each treatment. Alternative injection therapy deemed less beneficial. Goal is disease management and quality of life improvement. - Refer to Dr. Howie Ill for Lutathera treatment. - Schedule four Lutathera treatments, each two months apart. - Ensure Therapist, occupational for [employer]. - Monitor blood counts before each treatment. - Provide information on Lutathera. - Encourage high protein and high calorie diet. - Advise to stay active. - Follow up in three months. - Repeat PET scan every six months to assess treatment response.  Fatigue -Likely related to the high tumor burden and his advanced age - Encourage high protein and high calorie diet. - Advise to stay active.   Plan -I reviewed the PET scan images with patient and his wife today, the formal report is still pending -I made a urgent referral to radiologist Dr. Christella Scheuermann months to consider Lutathera therapy -Lab and follow-up with me in 3 months     Addendum Pt called on 04/25/2023 and reported worse fatigue, and new postprandial epigastric pain after he drunk protein drink, and abdominal bloating. I reviewed his lab from 11/25 which showed worsening LFT and new elevated tbil 2.0. I recommend Afinitor 7.5mg  (adjust per LFT Child-Pugh class A) daily for his NET while he is waiting for Lutathera. I have spoken with Dr. Amil Amen and he will see him in next few weeks.  [name]   Discussed the use of AI scribe software for clinical note transcription with the patient, who gave verbal consent to proceed.  History of Present Illness   The patient, an 88 year old male with a known diagnosis of metastatic neuroendocrine tumor, presents for a follow-up visit  after a recent PET scan. He reports persistent feelings of weakness and fatigue but is still able to perform routine daily activities. He denies any new symptoms,  including pain or discomfort, and maintains a good appetite with stable weight. He does not report any typical symptoms related to carcinoid tumors such as diarrhea or flushing.         All other systems were reviewed with the patient and are negative.  MEDICAL HISTORY:  Past Medical History:  Diagnosis Date   Basal cell carcinoma of auricle of right ear    BPH (benign prostatic hyperplasia)    DJD (degenerative joint disease)    left   ED (erectile dysfunction)    Fatigue    Hearing loss, sensorineural    Hx of adenomatous colonic polyps    Hx of major depression    Hypertension    Iron deficiency anemia    Nephrolithiasis    Paroxysmal atrial fibrillation (HCC)    Seasonal allergic rhinitis     SURGICAL HISTORY: Past Surgical History:  Procedure Laterality Date   CARDIOVERSION N/A 08/13/2015   Procedure: CARDIOVERSION;  Surgeon: Chrystie Nose, MD;  Location: [facility] ENDOSCOPY;  Service: Cardiovascular;  Laterality: N/A;   CARDIOVERSION N/A 04/04/2016   Procedure: CARDIOVERSION;  Surgeon: Quintella Reichert, MD;  Location: [facility] ENDOSCOPY;  Service: Cardiovascular;  Laterality: N/A;   CARDIOVERSION N/A 11/12/2017   Procedure: CARDIOVERSION;  Surgeon: Chrystie Nose, MD;  Location: [facility] ENDOSCOPY;  Service: Cardiovascular;  Laterality: N/A;   CHOLECYSTECTOMY     colonscopy     herniorraphy Bilateral    had surgery x2 on the left  and right   TONSILLECTOMY AND ADENOIDECTOMY      I have reviewed the social history and family history with the patient and they are unchanged from previous note.  ALLERGIES:  has No Known Allergies.  MEDICATIONS:  Current Outpatient Medications  Medication Sig Dispense Refill   amLODipine (NORVASC) 2.5 MG tablet Take 1 tablet (2.5 mg total) by mouth daily. 90 tablet 3   apixaban (ELIQUIS) 5 MG TABS tablet Take 5 mg by mouth 2 (two) times daily.     atorvastatin (LIPITOR) 20 MG tablet Take 20 mg by mouth daily.     buPROPion (WELLBUTRIN SR) 150 MG 12  hr tablet Take 150 mg by mouth daily.     flecainide (TAMBOCOR) 100 MG tablet Take 1 tablet (100 mg total) by mouth 2 (two) times daily. 180 tablet 1   LORazepam (ATIVAN) 0.5 MG tablet Take 0.5 mg by mouth daily.     mirtazapine (REMERON) 30 MG tablet Take 15 mg by mouth daily.     valsartan-hydrochlorothiazide (DIOVAN-HCT) 160-12.5 MG tablet Take 1 tablet by mouth daily.     vitamin B-12 (CYANOCOBALAMIN) 500 MCG tablet Take 500 mcg by mouth daily. Patient is taking it 3 times a week     No current facility-administered medications for this visit.    PHYSICAL EXAMINATION: ECOG PERFORMANCE STATUS: 2 - Symptomatic, <50% confined to bed  Vitals:   04/23/23 1042  BP: 123/76  Pulse: 76  Resp: 17  Temp: (!) 97.4 F (36.3 C)  SpO2: 99%   Wt Readings from Last 3 Encounters:  04/23/23 197 lb (89.4 kg)  04/10/23 197 lb 8 oz (89.6 kg)  04/06/23 200 lb (90.7 kg)     GENERAL:alert, no distress and comfortable SKIN: skin color, texture, turgor are normal, no rashes or significant lesions EYES: normal, Conjunctiva are pink and non-injected, sclera  clear Musculoskeletal:no cyanosis of digits and no clubbing  NEURO: alert & oriented x 3 with fluent speech, no focal motor/sensory deficits    LABORATORY DATA:  I have reviewed the data as listed    Latest Ref Rng & Units 04/23/2023   10:22 AM 04/06/2023    8:41 AM 03/29/2023    3:30 PM  CBC  WBC 4.0 - 10.5 K/uL 11.3  10.3  8.3   Hemoglobin 13.0 - 17.0 g/dL 16.1  09.6  04.5   Hematocrit 39.0 - 52.0 % 45.4  43.9  41.6   Platelets 150 - 400 K/uL 352  331  345         Latest Ref Rng & Units 04/23/2023   10:22 AM 03/29/2023    3:30 PM 12/18/2019    3:52 PM  CMP  Glucose 70 - 99 mg/dL 409  811  914   BUN 8 - 23 mg/dL 22  24  12    Creatinine 0.61 - 1.24 mg/dL 7.82  9.56  2.13   Sodium 135 - 145 mmol/L 136  136  135   Potassium 3.5 - 5.1 mmol/L 3.9  4.1  4.4   Chloride 98 - 111 mmol/L 95  99  97   CO2 22 - 32 mmol/L 32  30  28    Calcium 8.9 - 10.3 mg/dL 9.9  9.3  9.3   Total Protein 6.5 - 8.1 g/dL 8.4  7.2    Total Bilirubin <1.2 mg/dL 2.0  1.1    Alkaline Phos 38 - 126 U/L 782  577    AST 15 - 41 U/L 87  47    ALT 0 - 44 U/L 66  49        RADIOGRAPHIC STUDIES: I have personally reviewed the radiological images as listed and agreed with the findings in the report. No results found.    No orders of the defined types were placed in this encounter.  All questions were answered. The patient knows to call the clinic with any problems, questions or concerns. No barriers to learning was detected. The total time spent in the appointment was 30 minutes.     [name], MD 04/23/2023

## 2023-04-24 ENCOUNTER — Ambulatory Visit: Payer: Medicare Other | Admitting: Hematology

## 2023-04-25 ENCOUNTER — Telehealth: Payer: Self-pay | Admitting: Pharmacist

## 2023-04-25 ENCOUNTER — Telehealth: Payer: Self-pay

## 2023-04-25 ENCOUNTER — Telehealth: Payer: Self-pay | Admitting: Pharmacy Technician

## 2023-04-25 ENCOUNTER — Other Ambulatory Visit (HOSPITAL_COMMUNITY): Payer: Self-pay

## 2023-04-25 ENCOUNTER — Other Ambulatory Visit: Payer: Self-pay

## 2023-04-25 DIAGNOSIS — C7B8 Other secondary neuroendocrine tumors: Secondary | ICD-10-CM

## 2023-04-25 LAB — CHROMOGRANIN A: Chromogranin A (ng/mL): 254.7 ng/mL — ABNORMAL HIGH (ref 0.0–101.8)

## 2023-04-25 MED ORDER — OMEPRAZOLE 20 MG PO CPDR: 20.0000 mg | DELAYED_RELEASE_CAPSULE | Freq: Every day | ORAL | 0 refills | Status: DC

## 2023-04-25 MED ORDER — TRAMADOL HCL 50 MG PO TABS: 50.0000 mg | ORAL_TABLET | Freq: Four times a day (QID) | ORAL | 0 refills | Status: DC | PRN

## 2023-04-25 NOTE — Telephone Encounter (Signed)
Spoke with pt via telephone regarding c/o pain in lft rib cage.  Pt stated pain is 8/10 after eating and 3/10 when resting.  Pt stated the area is sore to touch.  Pt stated c/o feeling bloated after eating.  Pt stated he's eating potatoes, beans, cube steak, and drinking protein drinks.  Pt stated his last BM was today and stated he's having regular BM's.  Pt stated he has up to 2 to 3 BM's per day.  Pt denied jaw pain or chest pain.  Pt stated his gallbladder was removed 37yrs ago.  Pt denied taking any pain medication for the pain but stated he's taking Alkaseltzer and Pepto bismol.  Recommended pt to decrease eating food that are high in gas production like beans and green leafy vegetables.  Recommended pt stop drinking the protein drinks d/t "Whey" protein in drinks can cause increased gas productions.  Instructed pt to get up and move around more to help with pain relief.  Recommended pt to take Tums or GasX and start taking Pepcid AC OTC.  Pt verbalized understanding.  Stated this nurse will notify Dr. Mosetta Putt of the pt's symptoms and will have Dr. Mosetta Putt contact the pt to further assess pt's pain and neuroendocrine liver tumor.  Pt was thankful and stated he would await Dr. Latanya Maudlin call.

## 2023-04-25 NOTE — Telephone Encounter (Signed)
Oral Oncology Patient Advocate Encounter   Received notification that prior authorization for everolimus is required.   PA submitted on 04/25/23 Key [id number] Status is pending     Jinger Neighbors, CPhT-Adv Oncology Pharmacy Patient Advocate [facility] Direct Number: [phone]  Fax: [phone]

## 2023-04-25 NOTE — Telephone Encounter (Signed)
Oral Oncology Pharmacist Encounter  Received new prescription for Afinitor (everolimus) for the treatment of metastatic NET, planned duration until disease progression or unacceptable drug toxicity.  CBC w/ Diff and CMP from 04/23/23 assessed, noted patient with AST 87 U/L, ALT 66 U/L and T. Bili 2 mg/dL - MD dose reducing starting dose of Afinitor to 7.5 mg/d to account for this.  Current medication list in Epic reviewed, no relevant/significant DDIs with Afinitor identified.  Evaluated chart and no patient barriers to medication adherence noted.   Patient agreement for treatment documented in MD note on 04/23/23.  Oral Oncology Clinic will continue to follow for insurance authorization, copayment issues, initial counseling and start date.  Sherry Ruffing, PharmD, BCPS, [facility] Hematology/Oncology Clinical Pharmacist [facility] Oral Chemotherapy Navigation Clinics [phone] 04/25/2023 4:34 PM

## 2023-04-25 NOTE — Addendum Note (Signed)
Addended by: [name] on: 04/25/2023 03:53 PM   Modules accepted: Orders

## 2023-04-27 NOTE — Telephone Encounter (Signed)
Oral Oncology Patient Advocate Encounter  Received notification that the request for prior authorization for everolimus has been denied due to "EVEROLIMUS TAB 7.5MG  is not FDA approved or supported by an accepted reference for your medical condition(s): Advanced Neuroendocrine Tumor. In order to approve your medication, you must also have progressive, well-differentiated, non-functional neuroendocrine tumor of gastrointestinal or lung origin; or progressive neuroendocrine tumor of pancreatic origin. Therefore, your drug is denied because it is not being used for a "medically accepted indication"."    This decision can be appealed though [employer] by calling phone:[phone] Or by faxed request to fax: [phone]   Jinger Neighbors, CPhT-Adv Oncology Pharmacy Patient Advocate [facility] Direct Number: [phone]  Fax: [phone]

## 2023-04-30 NOTE — Telephone Encounter (Signed)
Oral Oncology Pharmacist Encounter   Urgent/Expedited appeal letter sent to [employer] Part D Appeals and [facility] department with supporting documentation. Appeal faxed to: [phone]    Oral Oncology Clinic will continue to follow.    Sherry Ruffing, PharmD, BCPS, BCOP Hematology/Oncology Clinical Pharmacist [facility] and [facility] Oral Chemotherapy Navigation Clinics [phone] 04/30/2023 2:24 PM

## 2023-04-30 NOTE — Telephone Encounter (Signed)
Oral Oncology Patient Advocate Encounter   An urgent appeal for the prior authorization denial of everolimus has been started by the pharmacist on 04/30/23.   This encounter will continue to be updated until final appeal determination.      Jinger Neighbors, CPhT-Adv Oncology Pharmacy Patient Advocate [facility] Direct Number: [phone]  Fax: [phone]

## 2023-05-01 ENCOUNTER — Other Ambulatory Visit (HOSPITAL_COMMUNITY): Payer: Self-pay

## 2023-05-01 ENCOUNTER — Telehealth: Payer: Self-pay | Admitting: Pharmacy Technician

## 2023-05-01 NOTE — Telephone Encounter (Signed)
Oral Oncology Patient Advocate Encounter   Began application for assistance for Afinitor Disperz through [employer].   Application will be submitted upon completion of necessary supporting documentation.   [employer] phone number [phone].  Patient to sign forms at office 05/02/23   I will continue to check the status until final determination.   Jinger Neighbors, CPhT-Adv Oncology Pharmacy Patient Advocate [facility] Direct Number: [phone]  Fax: [phone]

## 2023-05-01 NOTE — Telephone Encounter (Signed)
Oral Oncology Patient Advocate Encounter  Received fax request for additional information to support the appeal of Everolimus. Requested information sent via e-fax to [phone]. Information sent along with copy of original appeal letter.  Jinger Neighbors, CPhT-Adv Oncology Pharmacy Patient Advocate [facility] Direct Number: [phone]  Fax: [phone]

## 2023-05-01 NOTE — Telephone Encounter (Signed)
Oral Oncology Patient Advocate Encounter  Received notification that the request for prior authorization for everolimus dispersible has been denied due to medication falls under same category as non-dispersible everolimus. See previous denial.     Jinger Neighbors, CPhT-Adv Oncology Pharmacy Patient Advocate [facility] Direct Number: [phone]  Fax: [phone]

## 2023-05-01 NOTE — Telephone Encounter (Signed)
Oral Oncology Patient Advocate Encounter   Received notification that prior authorization for everolimus (dispersable) is required.   PA submitted on 05/01/23 Key [id number] Status is pending     Jinger Neighbors, CPhT-Adv Oncology Pharmacy Patient Advocate [facility] Direct Number: [phone]  Fax: [phone]

## 2023-05-02 NOTE — Telephone Encounter (Signed)
Oral Oncology Patient Advocate Encounter  Met with patient in lobby and collected required signatures and documents for the assistance application for Afinitor Disperz. Application is now pending MD signatures.,  Jinger Neighbors, CPhT-Adv Oncology Pharmacy Patient Advocate [facility] Direct Number: [phone]  Fax: [phone]

## 2023-05-07 NOTE — Telephone Encounter (Signed)
Oral Oncology Patient Advocate Encounter   Submitted application for assistance for Afinitor Disperz to [employer].   Application submitted via e-fax to [phone] on 05/04/23   [employer] phone number [phone].   I will continue to check the status until final determination.   Jinger Neighbors, CPhT-Adv Oncology Pharmacy Patient Advocate [facility] Direct Number: [phone]  Fax: [phone]

## 2023-05-08 NOTE — Telephone Encounter (Signed)
Oral Oncology Patient Advocate Encounter  Called [employer] to check status of application for Afinitor Disperz. Application had been closed due to an internal error at [employer], case has now been reopened and is under review. The representative is hopeful there will be a determination by Friday, 05/11/23.  I will continue to follow up until a final determination is made.  Jinger Neighbors, CPhT-Adv Oncology Pharmacy Patient Advocate [facility] Direct Number: [phone]  Fax: [phone]

## 2023-05-09 ENCOUNTER — Other Ambulatory Visit: Payer: Self-pay

## 2023-05-09 ENCOUNTER — Other Ambulatory Visit: Payer: Self-pay | Admitting: Hematology

## 2023-05-09 ENCOUNTER — Other Ambulatory Visit (HOSPITAL_COMMUNITY): Payer: Self-pay

## 2023-05-09 ENCOUNTER — Telehealth: Payer: Self-pay

## 2023-05-09 DIAGNOSIS — R160 Hepatomegaly, not elsewhere classified: Secondary | ICD-10-CM

## 2023-05-09 MED ORDER — DEXAMETHASONE 0.5 MG/5ML PO SOLN
1.0000 mg | Freq: Four times a day (QID) | ORAL | 2 refills | Status: DC
  Filled 2023-05-09: qty 500, 13d supply, fill #0
  Filled 2023-05-20: qty 500, 13d supply, fill #1
  Filled 2023-06-04: qty 500, 13d supply, fill #2

## 2023-05-09 MED ORDER — EVEROLIMUS 7.5 MG PO TABS: 7.5000 mg | ORAL_TABLET | Freq: Every day | ORAL | 0 refills | Status: DC

## 2023-05-09 NOTE — Telephone Encounter (Signed)
Spoke with pt via telephone to schedule lab appt for baseline CMP prior to starting pt's Afinitor.  Appt scheduled for 05/11/2023 @1030am .  Pt confirmed appt date and time.  Staff message sent to this nurse from Dr. Mosetta Putt regarding baseline CMP needed prior to pt starting new medication (Afinitor).

## 2023-05-09 NOTE — Telephone Encounter (Signed)
Oral Oncology Patient Advocate Encounter  Patient has opted to pay discounted cash price through [facility] online pharmacy in order to receive 30 day of the everolimus 7.5mg  tablets while awaiting final PAP decision.  The estimated cost will be $368.23 plus the cost of shipping.  Patient is aware of these costs.  I have sent an email to their preferred email address, [email or web address] with instructions on how to create their account.  I advised the patient to please reach out with any issues or concerns and to let me know once the account has been set up.  Jinger Neighbors, CPhT-Adv Oncology Pharmacy Patient Advocate [facility] Direct Number: [phone]  Fax: [phone]

## 2023-05-09 NOTE — Telephone Encounter (Signed)
Oral Oncology Patient Advocate Encounter   Received notification that the application for assistance for Afinitor Disperz through [employer] has been approved.   [employer] phone number [phone].   Effective dates: 05/09/23 through 05/28/24  Medication [facility] at [facility]  I have spoken to the patient.  Jinger Neighbors, CPhT-Adv Oncology Pharmacy Patient Advocate [facility] Direct Number: [phone]  Fax: [phone]

## 2023-05-11 ENCOUNTER — Inpatient Hospital Stay: Payer: Medicare Other | Attending: Hematology

## 2023-05-11 ENCOUNTER — Other Ambulatory Visit (HOSPITAL_COMMUNITY): Payer: Self-pay

## 2023-05-11 DIAGNOSIS — R160 Hepatomegaly, not elsewhere classified: Secondary | ICD-10-CM

## 2023-05-11 DIAGNOSIS — C7B8 Other secondary neuroendocrine tumors: Secondary | ICD-10-CM | POA: Diagnosis not present

## 2023-05-11 DIAGNOSIS — C7A8 Other malignant neuroendocrine tumors: Secondary | ICD-10-CM | POA: Insufficient documentation

## 2023-05-11 LAB — COMPREHENSIVE METABOLIC PANEL
ALT: 47 U/L — ABNORMAL HIGH (ref 0–44)
AST: 53 U/L — ABNORMAL HIGH (ref 15–41)
Albumin: 3.6 g/dL (ref 3.5–5.0)
Alkaline Phosphatase: 675 U/L — ABNORMAL HIGH (ref 38–126)
Anion gap: 5 (ref 5–15)
BUN: 20 mg/dL (ref 8–23)
CO2: 32 mmol/L (ref 22–32)
Calcium: 9.5 mg/dL (ref 8.9–10.3)
Chloride: 99 mmol/L (ref 98–111)
Creatinine, Ser: 1.12 mg/dL (ref 0.61–1.24)
GFR, Estimated: >60 mL/min (ref >60)
Glucose, Bld: 130 mg/dL — ABNORMAL HIGH (ref 70–99)
Potassium: 5.4 mmol/L — ABNORMAL HIGH (ref 3.5–5.1)
Sodium: 136 mmol/L (ref 135–145)
Total Bilirubin: 1.4 mg/dL — ABNORMAL HIGH (ref <1.2)
Total Protein: 7.3 g/dL (ref 6.5–8.1)

## 2023-05-11 NOTE — Telephone Encounter (Signed)
Oral Oncology Patient Advocate Encounter  Received call from patient and re-confirmed that he was approved for free drug with [employer]. Re-advised patient to call the program to set uphis first shipment of medication. I also re-discussed the option to use [facility] pharmacy if needed to expedite the time that it takes for him to start therapy.  Jinger Neighbors, CPhT-Adv Oncology Pharmacy Patient Advocate [facility] Direct Number: [phone]  Fax: [phone]

## 2023-05-14 ENCOUNTER — Telehealth: Payer: Self-pay

## 2023-05-14 NOTE — Assessment & Plan Note (Deleted)
-  Stage IV with diffuse liver metastasis, unknown primary. -diagnosed in 03/2023 -Patient presents with progressive weakness over a year, but still able to function well at home. -Due to the abnormal liver function, patient underwent ultrasound, CT and MRI scan of abdomen which showed diffuse liver metastasis, and slightly enlarged left supraclavicular and aortocaval lymph nodes. -Patient underwent a liver biopsy on April 06, 2023.  It showed well-differentiated neuroendocrine tumor, grade 1, less than 1 mitosis per 2 mm, 67 less than 3% -I recommend dotatate PET scan for staging and looking for primary,  and see if he is a candidate for Lutathera treatment -Patient does not have diarrhea or flushing, may not benefit much from Sandostatin injection. -If his dotatate PET scan is positive, I recommend Lutathera treatment as first-line therapy, to the bulky diffuse liver metastasis and his symptom of fatigue.  I discussed the benefit and potential side effects, and the logistics of the therapy.  He is interested.

## 2023-05-14 NOTE — Telephone Encounter (Signed)
Oral Chemotherapy Pharmacist Encounter   I spoke with patient for overview of: Afinitor (everolimus) for the treatment of metastatic NET, planned duration until disease progression or unacceptable drug toxicity.   Counseled patient on administration, dosing, side effects, monitoring, drug-food interactions, safe handling, storage, and disposal.  Patient will take Afinitor 7.5mg  tablets, 1 tablet by mouth once daily, with water, without regard to food.  Patient understands to take Afinitor consistently with regards to food and at approximately the same time each day. Patient knows to aviod grapefruit or grapefruit juice while on therapy with Afinitor.  Afinitor start date: pending - patient has ordered Afinitor 7.5 mg tablets from [facility] (expedited shipment) and medication is expected to arrive to patient's home this week (12/16-12/20)  Adverse effects include but are not limited to: mouth sores, GI upset, nausea, diarrhea, constipation, rash, increased blood sugars, decreased blood counts, increased blood pressure, edema.   Dexamethasone mouthwash for the prevention of stomatitis has been e-scribed to local pharmacy. We discussed appropriate use of mouthwash and duration of stomatitis prevention. Patient stated he has already taken a few doses of the mouth wash. We discussed that he can stop the mouthwash for now, and then resume it when he starts the Afinitor.   Reviewed with patient importance of keeping a medication schedule and plan for any missed doses. No barriers to medication adherence identified.  Medication reconciliation performed and medication/allergy list updated.  First fill of Afinitor 7.5 mg tablets was obtained [facility] (patient paid a reduced cash price to start medication), and this is expected to be delivered to patient's home somewhere between 12/17 and 12/20. Patient has also been approved to receive the Afinitor Disperz tablets (7 mg and 2 mg  tablets) for no charge from [employer]. Patient will take the Afinitor 7.5 mg tablets this first month, and then if he continues with Afinitor thereafter, will switch to the Disperz formulation. I informed the patient I would speak with him if/when he is going to start the Disperz formulation so he has the instructions on how to properly dissolve the medication in water for administration.   All questions answered.  Mr. Corey [name] voiced understanding and appreciation.   Medication education handout placed in mail for patient. Patient knows to call the office with questions or concerns. Oral Chemotherapy Clinic phone number provided to patient.   Sherry Ruffing, PharmD, BCPS, BCOP Hematology/Oncology Clinical Pharmacist [facility] and [facility] Oral Chemotherapy Navigation Clinics [phone] 05/14/2023 10:48 AM

## 2023-05-14 NOTE — Telephone Encounter (Signed)
Called patient and relayed the message below as per Dr. Mosetta Putt. Patient stated he is not taking any Supplements with potassium in them so he will be very watchful about his diet.   [name]  Let him know his lab last week was OK, tbil came down some, K was high, avoid high K diet and stop K supplement if he is on. Please postpone his appointments tomorrow to the week of 1/6, and let him call if he has any issues with Afinitor before next appointment  [name]

## 2023-05-15 ENCOUNTER — Inpatient Hospital Stay: Payer: Medicare Other | Admitting: Hematology

## 2023-05-15 ENCOUNTER — Inpatient Hospital Stay: Payer: Medicare Other

## 2023-05-18 NOTE — Telephone Encounter (Signed)
Oral Oncology Patient Advocate Encounter  Called to check if patient has scheduled first shipment of Afinitor Disperz. Per the PAP pharmacy, medication is in the final verification stages and they have not yet reached out to patient to set up shipment.  Jinger Neighbors, CPhT-Adv Oncology Pharmacy Patient Advocate [facility] Direct Number: [phone]  Fax: [phone]

## 2023-05-21 ENCOUNTER — Other Ambulatory Visit (HOSPITAL_COMMUNITY): Payer: Self-pay

## 2023-05-25 NOTE — Telephone Encounter (Signed)
Oral Oncology Patient Advocate Encounter  Medication shipment scheduled.  Jinger Neighbors, CPhT-Adv Oncology Pharmacy Patient Advocate [facility] Direct Number: [phone]  Fax: [phone]

## 2023-05-28 ENCOUNTER — Other Ambulatory Visit: Payer: Self-pay

## 2023-05-29 ENCOUNTER — Other Ambulatory Visit: Payer: Self-pay | Admitting: Hematology

## 2023-06-03 NOTE — Assessment & Plan Note (Addendum)
-  Stage IV with diffuse liver metastasis, unknown primary. -diagnosed in 03/2023 -Patient presents with progressive weakness over a year, but still able to function well at home. -Due to the abnormal liver function, patient underwent ultrasound, CT and MRI scan of abdomen which showed diffuse liver metastasis, and slightly enlarged left supraclavicular and aortocaval lymph nodes. -Patient underwent a liver biopsy on April 06, 2023.  It showed well-differentiated neuroendocrine tumor, grade 1, less than 1 mitosis per 2 mm, 67 less than 3% -I recommend dotatate PET scan for staging and looking for primary,  and see if he is a candidate for Lutathera treatment -Patient does not have diarrhea or flushing, may not benefit much from Sandostatin injection. -his dotatate PET scan was positive, so he was referred to Dr. Malva for lutathera therapy. Due to worsening LFTs, I also started him on Afinitor  on 05/16/2023 but he tolerated poorly and stopped on 06/04/2023

## 2023-06-04 ENCOUNTER — Inpatient Hospital Stay (HOSPITAL_BASED_OUTPATIENT_CLINIC_OR_DEPARTMENT_OTHER): Payer: Medicare Other | Admitting: Hematology

## 2023-06-04 ENCOUNTER — Ambulatory Visit (HOSPITAL_COMMUNITY)
Admission: RE | Admit: 2023-06-04 | Discharge: 2023-06-04 | Disposition: A | Discharge status: Home / Self Care | Payer: Medicare Other | Source: Ambulatory Visit | Attending: Hematology | Admitting: Hematology

## 2023-06-04 ENCOUNTER — Inpatient Hospital Stay: Payer: Medicare Other | Attending: Hematology

## 2023-06-04 ENCOUNTER — Other Ambulatory Visit (HOSPITAL_COMMUNITY): Payer: Self-pay

## 2023-06-04 ENCOUNTER — Encounter: Payer: Self-pay | Admitting: Hematology

## 2023-06-04 VITALS — BP 137/72 | HR 73 | Temp 98.7°F | Resp 18 | Ht 72.0 in | Wt 193.6 lb

## 2023-06-04 DIAGNOSIS — E860 Dehydration: Secondary | ICD-10-CM | POA: Insufficient documentation

## 2023-06-04 DIAGNOSIS — C7A8 Other malignant neuroendocrine tumors: Secondary | ICD-10-CM | POA: Insufficient documentation

## 2023-06-04 DIAGNOSIS — C7B8 Other secondary neuroendocrine tumors: Secondary | ICD-10-CM | POA: Insufficient documentation

## 2023-06-04 DIAGNOSIS — R918 Other nonspecific abnormal finding of lung field: Secondary | ICD-10-CM | POA: Diagnosis not present

## 2023-06-04 DIAGNOSIS — J984 Other disorders of lung: Secondary | ICD-10-CM | POA: Diagnosis not present

## 2023-06-04 DIAGNOSIS — R160 Hepatomegaly, not elsewhere classified: Secondary | ICD-10-CM

## 2023-06-04 DIAGNOSIS — R058 Other specified cough: Secondary | ICD-10-CM | POA: Diagnosis not present

## 2023-06-04 LAB — CBC WITH DIFFERENTIAL/PLATELET
Abs Immature Granulocytes: 0.01 10*3/uL (ref 0.00–0.07)
Basophils Absolute: 0.1 10*3/uL (ref 0.0–0.1)
Basophils Relative: 1 %
Eosinophils Absolute: 0.1 10*3/uL (ref 0.0–0.5)
Eosinophils Relative: 2 %
HCT: 39.5 % (ref 39.0–52.0)
Hemoglobin: 13.5 g/dL (ref 13.0–17.0)
Immature Granulocytes: 0 %
Lymphocytes Relative: 11 %
Lymphs Abs: 0.6 10*3/uL — ABNORMAL LOW (ref 0.7–4.0)
MCH: 29.9 pg (ref 26.0–34.0)
MCHC: 34.2 g/dL (ref 30.0–36.0)
MCV: 87.4 fL (ref 80.0–100.0)
Monocytes Absolute: 0.7 10*3/uL (ref 0.1–1.0)
Monocytes Relative: 14 %
Neutro Abs: 3.8 10*3/uL (ref 1.7–7.7)
Neutrophils Relative %: 72 %
Platelets: 130 10*3/uL — ABNORMAL LOW (ref 150–400)
RBC: 4.52 MIL/uL (ref 4.22–5.81)
RDW: 13.4 % (ref 11.5–15.5)
WBC: 5.3 10*3/uL (ref 4.0–10.5)
nRBC: 0 % (ref 0.0–0.2)

## 2023-06-04 LAB — COMPREHENSIVE METABOLIC PANEL
ALT: 44 U/L (ref 0–44)
AST: 56 U/L — ABNORMAL HIGH (ref 15–41)
Albumin: 3.6 g/dL (ref 3.5–5.0)
Alkaline Phosphatase: 584 U/L — ABNORMAL HIGH (ref 38–126)
Anion gap: 8 (ref 5–15)
BUN: 22 mg/dL (ref 8–23)
CO2: 30 mmol/L (ref 22–32)
Calcium: 8.7 mg/dL — ABNORMAL LOW (ref 8.9–10.3)
Chloride: 92 mmol/L — ABNORMAL LOW (ref 98–111)
Creatinine, Ser: 1.29 mg/dL — ABNORMAL HIGH (ref 0.61–1.24)
GFR, Estimated: 54 mL/min — ABNORMAL LOW (ref >60)
Glucose, Bld: 211 mg/dL — ABNORMAL HIGH (ref 70–99)
Potassium: 3.5 mmol/L (ref 3.5–5.1)
Sodium: 130 mmol/L — ABNORMAL LOW (ref 135–145)
Total Bilirubin: 1.3 mg/dL — ABNORMAL HIGH (ref 0.0–1.2)
Total Protein: 7 g/dL (ref 6.5–8.1)

## 2023-06-04 NOTE — Progress Notes (Signed)
 [facility]   [phone]) [phone] [phone]) [phone]   Clinic Follow up Note   Patient Care Team: Charlott Dorn LABOR, MD as PCP - General (Internal Medicine) Mona Vinie BROCKS, MD as PCP - Cardiology (Cardiology) Lanny Callander, MD as Consulting Physician (Hematology and Oncology) 06/04/2023  I connected with Leonce LELON Seals on 06/04/23 at  2:20 PM EST by telephone and verified that I am speaking with the correct person using two identifiers.   I discussed the limitations, risks, security and privacy concerns of performing an evaluation and management service by telephone and the availability of in person appointments. I also discussed with the patient that there may be a patient responsible charge related to this service. The patient expressed understanding and agreed to proceed.   Patient's location:  Office  Provider's location:  Home    CHIEF COMPLAINT: f/u NET   CURRENT THERAPY: Everolimus  7.5 mg daily  Oncology history Metastatic malignant neuroendocrine tumor to liver (HCC) -Stage IV with diffuse liver metastasis, unknown primary. -diagnosed in 03/2023 -Patient presents with progressive weakness over a year, but still able to function well at home. -Due to the abnormal liver function, patient underwent ultrasound, CT and MRI scan of abdomen which showed diffuse liver metastasis, and slightly enlarged left supraclavicular and aortocaval lymph nodes. -Patient underwent a liver biopsy on April 06, 2023.  It showed well-differentiated neuroendocrine tumor, grade 1, less than 1 mitosis per 2 mm, 67 less than 3% -I recommend dotatate PET scan for staging and looking for primary,  and see if he is a candidate for Lutathera treatment -Patient does not have diarrhea or flushing, may not benefit much from Sandostatin injection. -his dotatate PET scan was positive, so he was referred to Dr. Malva for lutathera therapy. Due to worsening LFTs, I also started him  on Afinitor  on 05/16/2023 but he tolerated poorly and stopped on 06/04/2023  Assessment and Plan    Neuroendocrine Tumor Started on Afinitor  in December due to worsening liver function. Experiencing side effects including fatigue and possible cough. Potential candidate for Lutathera (Lutetium Lu 177 dotatate) treatment, pending consultation with Dr. Phyllistine. Lutathera involves bi-monthly infusions, with fatigue not being a primary concern. Insurance approval required. - Contact Dr. Otis office for consultation - Plan to start Lutathera treatment next month, pending insurance approval  Fatigue Severe fatigue and lack of strength, likely due to Afinitor . Liver function stabilized with bilirubin at 1.3. Sodium levels slightly low, likely due to dehydration from recent diarrhea. - Discontinue Afinitor  - Encourage increased fluid intake (water, Pedialyte, Gatorade)  Cough and hypoxia  Persistent cough, possibly due to Afinitor . Shortness of breath with exertion. - Check oxygen levels during a 5-minute walk test - Order chest x-ray at [facility] - If cough resolves after stopping Afinitor , no further action needed - If chest x-ray is abnormal, consider additional tests  Thrombocytopenia Platelet count slightly low, a known side effect of Afinitor . - Monitor platelet count  Dehydration Slight dehydration likely due to recent diarrhea, affecting kidney function and sodium levels. - Encourage increased fluid intake (water, Pedialyte, Gatorade)  plan -Due to his poor tolerance to everolimus , will stop it -His pulse ox dropped to 88% to 89% when he walks for 5 mins, I ordered CXR for him today. It showed mild patchy airspace disease, suggesting early multifocal pneumonia or pulmonary edema.  Clinically no concern for pneumonia, I will reach out to his cardiologist Dr. Mona to get his opinion also  -repeat lab next  week   -urgent referral to Dr. Malva for Lutathera therapy  -lab  and f/u in a month    Discussed the use of AI scribe software for clinical note transcription with the patient, who gave verbal consent to proceed.  History of Present Illness   The patient, with a history of metastatic neuroendocrine tumor, presents with worsening fatigue and a persistent cough. They report a significant drop in energy levels after starting Afinitor , to the point where they 'cannot do anything.' They also report a 'real bad cough' that lasts all day once it starts. They have noticed shortness of breath, particularly when climbing stairs, which requires them to rest for about 15 minutes afterwards. They also report having diarrhea in the past few days.         REVIEW OF SYSTEMS:   Constitutional: Denies fevers, chills or abnormal weight loss, (+) worsening fatigue  Eyes: Denies blurriness of vision Ears, nose, mouth, throat, and face: Denies mucositis or sore throat Respiratory: Denies cough, dyspnea or wheezes Cardiovascular: Denies palpitation, chest discomfort or lower extremity swelling Gastrointestinal:  Denies nausea, heartburn or change in bowel habits Skin: Denies abnormal skin rashes Lymphatics: Denies new lymphadenopathy or easy bruising Neurological:Denies numbness, tingling or new weaknesses Behavioral/Psych: Mood is stable, no new changes  All other systems were reviewed with the patient and are negative.  MEDICAL HISTORY:  Past Medical History:  Diagnosis Date   Basal cell carcinoma of auricle of right ear    BPH (benign prostatic hyperplasia)    DJD (degenerative joint disease)    left   ED (erectile dysfunction)    Fatigue    Hearing loss, sensorineural    Hx of adenomatous colonic polyps    Hx of major depression    Hypertension    Iron deficiency anemia    Nephrolithiasis    Paroxysmal atrial fibrillation (HCC)    Seasonal allergic rhinitis     SURGICAL HISTORY: Past Surgical History:  Procedure Laterality Date   CARDIOVERSION N/A  08/13/2015   Procedure: CARDIOVERSION;  Surgeon: Vinie JAYSON Maxcy, MD;  Location: [facility] ENDOSCOPY;  Service: Cardiovascular;  Laterality: N/A;   CARDIOVERSION N/A 04/04/2016   Procedure: CARDIOVERSION;  Surgeon: Wilbert JONELLE Bihari, MD;  Location: [facility] ENDOSCOPY;  Service: Cardiovascular;  Laterality: N/A;   CARDIOVERSION N/A 11/12/2017   Procedure: CARDIOVERSION;  Surgeon: Maxcy Vinie JAYSON, MD;  Location: [facility] ENDOSCOPY;  Service: Cardiovascular;  Laterality: N/A;   CHOLECYSTECTOMY     colonscopy     herniorraphy Bilateral    had surgery x2 on the left  and right   TONSILLECTOMY AND ADENOIDECTOMY      I have reviewed the social history and family history with the patient and they are unchanged from previous note.  ALLERGIES:  has no known allergies.  MEDICATIONS:  Current Outpatient Medications  Medication Sig Dispense Refill   amLODipine  (NORVASC ) 2.5 MG tablet Take 1 tablet (2.5 mg total) by mouth daily. 90 tablet 3   apixaban  (ELIQUIS ) 5 MG TABS tablet Take 5 mg by mouth 2 (two) times daily.     atorvastatin  (LIPITOR) 20 MG tablet Take 20 mg by mouth daily.     buPROPion  (WELLBUTRIN  SR) 150 MG 12 hr tablet Take 150 mg by mouth daily.     dexamethasone  (DECADRON ) 0.5 MG/5ML solution Take 10 mLs (1 mg total) by mouth in the morning, at noon, in the evening, and at bedtime. Swish for 2 minutes in mouth and then spit. Do not eat or drink  for 1 hour after mouth rinse. 500 mL 2   everolimus  (AFINITOR ) 7.5 MG tablet Take 1 tablet (7.5 mg total) by mouth daily. 30 tablet 0   flecainide  (TAMBOCOR ) 100 MG tablet Take 1 tablet (100 mg total) by mouth 2 (two) times daily. 180 tablet 1   LORazepam (ATIVAN) 0.5 MG tablet Take 0.5 mg by mouth daily.     mirtazapine  (REMERON ) 30 MG tablet Take 15 mg by mouth daily.     omeprazole  (PRILOSEC) 20 MG capsule Take 1 capsule by mouth once daily 30 capsule 0   traMADol  (ULTRAM ) 50 MG tablet Take 1 tablet (50 mg total) by mouth every 6 (six) hours as needed. 15 tablet 0    valsartan -hydrochlorothiazide  (DIOVAN -HCT) 160-12.5 MG tablet Take 1 tablet by mouth daily.     vitamin B-12 (CYANOCOBALAMIN ) 500 MCG tablet Take 500 mcg by mouth daily. Patient is taking it 3 times a week     No current facility-administered medications for this visit.    PHYSICAL EXAMINATION: Not performed   LABORATORY DATA:  I have reviewed the data as listed    Latest Ref Rng & Units 06/04/2023    1:15 PM 04/23/2023   10:22 AM 04/06/2023    8:41 AM  CBC  WBC 4.0 - 10.5 K/uL 5.3  11.3  10.3   Hemoglobin 13.0 - 17.0 g/dL 86.4  84.5  85.6   Hematocrit 39.0 - 52.0 % 39.5  45.4  43.9   Platelets 150 - 400 K/uL 130  352  331         Latest Ref Rng & Units 06/04/2023    1:15 PM 05/11/2023   10:42 AM 04/23/2023   10:22 AM  CMP  Glucose 70 - 99 mg/dL 788  869  880   BUN 8 - 23 mg/dL 22  20  22    Creatinine 0.61 - 1.24 mg/dL 8.70  8.87  8.86   Sodium 135 - 145 mmol/L 130  136  136   Potassium 3.5 - 5.1 mmol/L 3.5  5.4  3.9   Chloride 98 - 111 mmol/L 92  99  95   CO2 22 - 32 mmol/L 30  32  32   Calcium  8.9 - 10.3 mg/dL 8.7  9.5  9.9   Total Protein 6.5 - 8.1 g/dL 7.0  7.3  8.4   Total Bilirubin 0.0 - 1.2 mg/dL 1.3  1.4  2.0   Alkaline Phos 38 - 126 U/L 584  675  782   AST 15 - 41 U/L 56  53  87   ALT 0 - 44 U/L 44  47  66       RADIOGRAPHIC STUDIES: I have personally reviewed the radiological images as listed and agreed with the findings in the report. DG Chest 2 View Result Date: 06/04/2023 CLINICAL DATA:  Cough.  Productive cough EXAM: CHEST - 2 VIEW COMPARISON:  08/10/2022 FINDINGS: Normal cardiac silhouette. There is patchy bilateral airspace disease. No pleural fluid. No pneumothorax. IMPRESSION: Mild patchy airspace disease suggests early multifocal pneumonia versus pulmonary edema. These results will be called to the ordering clinician or representative by the Radiologist Assistant, and communication documented in the PACS or [employer]. Electronically Signed    By: Jackquline Boxer M.D.   On: 06/04/2023 16:46       I discussed the assessment and treatment plan with the patient. The patient was provided an opportunity to ask questions and all were answered. The patient agreed with the plan  and demonstrated an understanding of the instructions.   The patient was advised to call back or seek an in-person evaluation if the symptoms worsen or if the condition fails to improve as anticipated.  I provided 25 minutes of face-to-face video visit time during this encounter, and > 50% was spent counseling as documented under my assessment & plan.     Onita Mattock, MD 06/04/23

## 2023-06-05 ENCOUNTER — Ambulatory Visit: Payer: Medicare Other

## 2023-06-05 ENCOUNTER — Other Ambulatory Visit (HOSPITAL_COMMUNITY): Payer: Self-pay | Admitting: Hematology

## 2023-06-05 ENCOUNTER — Other Ambulatory Visit: Payer: Self-pay | Admitting: *Deleted

## 2023-06-05 ENCOUNTER — Other Ambulatory Visit: Payer: Self-pay

## 2023-06-05 ENCOUNTER — Encounter: Payer: Self-pay | Admitting: Internal Medicine

## 2023-06-05 ENCOUNTER — Inpatient Hospital Stay: Payer: Medicare Other

## 2023-06-05 ENCOUNTER — Ambulatory Visit: Payer: Medicare Other | Attending: Internal Medicine | Admitting: Internal Medicine

## 2023-06-05 VITALS — BP 102/58 | HR 71 | Wt 193.6 lb

## 2023-06-05 DIAGNOSIS — N179 Acute kidney failure, unspecified: Secondary | ICD-10-CM | POA: Diagnosis not present

## 2023-06-05 DIAGNOSIS — I4510 Unspecified right bundle-branch block: Secondary | ICD-10-CM | POA: Diagnosis not present

## 2023-06-05 DIAGNOSIS — I5033 Acute on chronic diastolic (congestive) heart failure: Secondary | ICD-10-CM | POA: Diagnosis not present

## 2023-06-05 DIAGNOSIS — J9601 Acute respiratory failure with hypoxia: Secondary | ICD-10-CM | POA: Diagnosis not present

## 2023-06-05 DIAGNOSIS — C7B8 Other secondary neuroendocrine tumors: Secondary | ICD-10-CM

## 2023-06-05 DIAGNOSIS — I5031 Acute diastolic (congestive) heart failure: Secondary | ICD-10-CM | POA: Diagnosis not present

## 2023-06-05 DIAGNOSIS — Z743 Need for continuous supervision: Secondary | ICD-10-CM | POA: Diagnosis not present

## 2023-06-05 DIAGNOSIS — J189 Pneumonia, unspecified organism: Secondary | ICD-10-CM | POA: Diagnosis not present

## 2023-06-05 DIAGNOSIS — C787 Secondary malignant neoplasm of liver and intrahepatic bile duct: Secondary | ICD-10-CM | POA: Diagnosis not present

## 2023-06-05 DIAGNOSIS — E871 Hypo-osmolality and hyponatremia: Secondary | ICD-10-CM | POA: Diagnosis not present

## 2023-06-05 DIAGNOSIS — I480 Paroxysmal atrial fibrillation: Secondary | ICD-10-CM | POA: Diagnosis not present

## 2023-06-05 DIAGNOSIS — C7A8 Other malignant neuroendocrine tumors: Secondary | ICD-10-CM | POA: Diagnosis not present

## 2023-06-05 DIAGNOSIS — J810 Acute pulmonary edema: Secondary | ICD-10-CM | POA: Diagnosis not present

## 2023-06-05 DIAGNOSIS — J168 Pneumonia due to other specified infectious organisms: Secondary | ICD-10-CM | POA: Diagnosis not present

## 2023-06-05 DIAGNOSIS — E1165 Type 2 diabetes mellitus with hyperglycemia: Secondary | ICD-10-CM | POA: Diagnosis not present

## 2023-06-05 DIAGNOSIS — D3A8 Other benign neuroendocrine tumors: Secondary | ICD-10-CM | POA: Diagnosis not present

## 2023-06-05 DIAGNOSIS — R531 Weakness: Secondary | ICD-10-CM | POA: Diagnosis not present

## 2023-06-05 DIAGNOSIS — Z79899 Other long term (current) drug therapy: Secondary | ICD-10-CM | POA: Diagnosis not present

## 2023-06-05 DIAGNOSIS — Z7189 Other specified counseling: Secondary | ICD-10-CM | POA: Diagnosis not present

## 2023-06-05 DIAGNOSIS — B49 Unspecified mycosis: Secondary | ICD-10-CM | POA: Diagnosis not present

## 2023-06-05 DIAGNOSIS — R64 Cachexia: Secondary | ICD-10-CM | POA: Diagnosis not present

## 2023-06-05 DIAGNOSIS — I130 Hypertensive heart and chronic kidney disease with heart failure and stage 1 through stage 4 chronic kidney disease, or unspecified chronic kidney disease: Secondary | ICD-10-CM | POA: Diagnosis not present

## 2023-06-05 DIAGNOSIS — Z1152 Encounter for screening for COVID-19: Secondary | ICD-10-CM | POA: Diagnosis not present

## 2023-06-05 DIAGNOSIS — D630 Anemia in neoplastic disease: Secondary | ICD-10-CM | POA: Diagnosis not present

## 2023-06-05 DIAGNOSIS — I499 Cardiac arrhythmia, unspecified: Secondary | ICD-10-CM | POA: Diagnosis not present

## 2023-06-05 DIAGNOSIS — D849 Immunodeficiency, unspecified: Secondary | ICD-10-CM | POA: Diagnosis not present

## 2023-06-05 DIAGNOSIS — I700 Atherosclerosis of aorta: Secondary | ICD-10-CM | POA: Diagnosis not present

## 2023-06-05 DIAGNOSIS — I5021 Acute systolic (congestive) heart failure: Secondary | ICD-10-CM | POA: Diagnosis not present

## 2023-06-05 DIAGNOSIS — I10 Essential (primary) hypertension: Secondary | ICD-10-CM | POA: Diagnosis not present

## 2023-06-05 DIAGNOSIS — I509 Heart failure, unspecified: Secondary | ICD-10-CM | POA: Diagnosis not present

## 2023-06-05 DIAGNOSIS — R9389 Abnormal findings on diagnostic imaging of other specified body structures: Secondary | ICD-10-CM | POA: Diagnosis not present

## 2023-06-05 DIAGNOSIS — R0602 Shortness of breath: Secondary | ICD-10-CM | POA: Diagnosis not present

## 2023-06-05 DIAGNOSIS — E43 Unspecified severe protein-calorie malnutrition: Secondary | ICD-10-CM | POA: Diagnosis not present

## 2023-06-05 DIAGNOSIS — N1831 Chronic kidney disease, stage 3a: Secondary | ICD-10-CM | POA: Diagnosis not present

## 2023-06-05 DIAGNOSIS — R059 Cough, unspecified: Secondary | ICD-10-CM | POA: Diagnosis not present

## 2023-06-05 DIAGNOSIS — I110 Hypertensive heart disease with heart failure: Secondary | ICD-10-CM | POA: Diagnosis not present

## 2023-06-05 DIAGNOSIS — C799 Secondary malignant neoplasm of unspecified site: Secondary | ICD-10-CM | POA: Diagnosis not present

## 2023-06-05 DIAGNOSIS — R4589 Other symptoms and signs involving emotional state: Secondary | ICD-10-CM | POA: Diagnosis not present

## 2023-06-05 DIAGNOSIS — J439 Emphysema, unspecified: Secondary | ICD-10-CM | POA: Diagnosis not present

## 2023-06-05 DIAGNOSIS — E1122 Type 2 diabetes mellitus with diabetic chronic kidney disease: Secondary | ICD-10-CM | POA: Diagnosis not present

## 2023-06-05 DIAGNOSIS — Z66 Do not resuscitate: Secondary | ICD-10-CM | POA: Diagnosis not present

## 2023-06-05 DIAGNOSIS — Z515 Encounter for palliative care: Secondary | ICD-10-CM | POA: Diagnosis not present

## 2023-06-05 DIAGNOSIS — I4430 Unspecified atrioventricular block: Secondary | ICD-10-CM | POA: Diagnosis not present

## 2023-06-05 DIAGNOSIS — I452 Bifascicular block: Secondary | ICD-10-CM | POA: Diagnosis not present

## 2023-06-05 MED ORDER — FUROSEMIDE 40 MG PO TABS: 40.0000 mg | ORAL_TABLET | Freq: Every day | ORAL | 3 refills | Status: DC

## 2023-06-05 NOTE — Addendum Note (Signed)
 Addended by: Jacki Cones F on: 06/05/2023 10:52 AM   Modules accepted: Orders

## 2023-06-05 NOTE — Progress Notes (Addendum)
 Cardiology Office Note  Date:  06/05/2023   ID:  Corey [name], Corey [name] 1934/10/20, MRN [id number]  PCP:  Charlott Dorn LABOR, MD   CC: Fatigue, DOE   History of Present Illness: Corey [name] is a 89 y.o. male who presents for cardiology evaluation.  He is seen by me for the first time today.  He is seen at the request of Dr. Norleen Lewis.  The patient is being seen because of atrial fibrillation of unknown duration.  The patient was seen for a physical in November 2016 by Dr. Lewis.  No EKG was done at that time according to the patient, but no mention of atrial fibrillation or irregular pulse wasn't mentioned.  Subsequently had a nurse practitioner house call about a week ago who performed a physical examination on him at his home.  She noted an irregular pulse.  Corey [name] office was notified and he was seen in Dr. Vonnie office on 07/14/15 by Lucie Bracket, NP, who obtained an electrocardiogram and confirmed the presence of atrial fibrillation. The patient himself is really not aware of his heart rate.  He does not know how long he may have been in the atrial fibrillation.  He began experiencing fatigue several years ago and because of that stop playing golf about 2 years ago.  He would give out after playing about 12 holes of golf.  He has not had any symptoms of congestive heart failure.  He sleeps on one pillow.  He does not have any difficulty climbing stairs.  He has had a sense of vague chest tightness at times.  There is no radiation to the arms.  The past 6 months the patient has also had occasional night sweats which are new. The patient has had some aching in the muscles of his legs.  He is on statin therapy. He has not had any TIA symptoms.  He has not had any dizziness or syncope.  Corey [name] is seen in the office today is a new patient to me however he was recently seen by Dr. Dominick. Unfortunately Dr. Dominick just retired and as Corey [name] wife  is my patient, he is kindly requested my services. This or Kirksey says over the past year he's had some worsening fatigue and decreased energy levels. In fact she's given up a lot of activities including golf which he enjoyed playing. He thought it was just related to being older. He also stopped doing a lot of lawn work and other activities. Recently had a home physical where he was found to be out of rhythm and then had an EKG which demonstrated atrial fibrillation. He was started on Eliquis  and was seen by Dr. Dominick. An echocardiogram and stress test were ordered. Both studies showed low normal LVEF of 50-55% however the nuclear stress test showed no ischemia. He had only mild left atrial enlargement on his echocardiogram. Apparently Dr. Dominick had discussed the possibility of a cardioversion with him but had recommended at least 1 month of anticoagulation. Today is his 28th day of Eliquis . He has not missed any doses. EKG shows persistent atrial fibrillation.  I saw Corey [name] back today in the office for follow-up. He underwent successful cardioversion to sinus rhythm and maintains that today. He notes that he's had some improvement in his energy and was able to mow his lawn without having to stop. He started walking again and is doing about a mile a day. Generally seems to be thriving. Heart  rate is noted to be low in the 50s but he noted that it was in the 50s for most of his life. He denies any chest pain, shortness of breath and as mentioned his energy level has improved. He seems to be tolerating Eliquis  without any bleeding complications.  02/16/2016  Corey [name] is doing well without any complaints today. He is maintaining a sinus bradycardia with heart rate of 47 and is asymptomatic. He is not on any AV nodal blocking medications. He is on Eliquis  5 mg twice a day and has no complaints. He denies any bleeding. He is on Lipitor for dyslipidemia but has not had a recent lipid  profile. He denies chest pain or shortness of breath with exertion.  07/17/2016  Corey [name] returns today for follow-up. He is currently in sinus bradycardia with first-degree AV block at 53. He remains on Eliquis  for anticoagulation. He was previously on flecainide  50 mg twice daily, but given recent palpitations his medication was increased to 75 mg twice daily and then 100 mg twice daily in the A. fib clinic. He reports good control of her palpitations now although heart rate is low in the 50s. He says with exercise he gets short of breath and has some fatigue which could indicate some chronotropic incompetence.  08/03/2016  Corey [name] returns today for follow-up of his stress test. He was able to exercise for 7 minutes and achieved a max predicted heart rate of 92%. He achieved a heart rate of 85% max predicted after 4.5 minutes and had recovery after 7 minutes. There is no evidence of chronotropic incompetence. The study was negative for ischemia. He reports the shortness of breath has improved significantly. He's been able to do some heavy yard work since then without any difficulty. I suspect his symptoms actually were related to probably atrial mechanical inactivity with his recent A. fib which was slow to recover despite the fact that he is flecainide  has restored sinus rhythm.  01/30/2017  Corey [name] returns today for follow-up. He continues to have bradycardia. He had exercise stress testing which showed improvement in heart rate and no chronotropic incompetence. He is walking now up to about 2 miles. He says his heart rate can get up into the 80s. His shortness of breath is still present with marked exertion but goes away fairly quickly. He is maintaining a sinus bradycardia. He denies any bleeding problems on the Eliquis . He said no labs that we can detect this year. His last lipid profile was September 2017 which showed an LDL 69.  02/21/2018  Corey [name] is seen today for  annual follow-up.  He remains asymptomatic and denies any worsening or recurrent atrial fibrillation.  He has persistent bradycardia but says his heart rate goes up with exercise and monitors it with a fit bit.  He does get short of breath only with marked exertion and it improves quickly after resting.  Blood pressures been well controlled.  He denies any adverse bleeding on Eliquis .  He had labs with his PCP at [facility] primary care this year and had a well-controlled lipid profile.  02/12/2019  Corey [name] returns today for follow-up of his stress test.  He underwent routine exercise treadmill stress testing due to flecainide  for which she takes for atrial flutter.  The treadmill stress test however was abnormal demonstrating significant ST segment depression.  Fortunately, he did not have any symptoms of chest pain or worsening shortness of breath during the study.  A solitary PVC  was noted.  He says afterwards his heart rate was elevated somewhat in the 60s to 80s and bounced around during the day but then went back into the 40s or 50s the next day which is his baseline.  Overall he feels well.  He also had recent Mohs surgery for basal cell carcinoma on his nose, unfortunately they did not hold his anticoagulation prior to the procedure but subsequently requested holding it after he had significant bleeding complications.  Fortunately that has resolved.  04/20/2020  Corey [name] is seen today for annual follow-up.  He had been seen a couple times over the summer regarding issues with possible A. fib.  He noted some increase in heart rate.  EKG however did not show any breakthrough A. fib.  He has been unfortunately struggling with recurrent depression.  Recently been started on some medication which is a newer medicine and seems to be helping him.  This is good because there were some unfortunate suicidal thoughts to the point where he said that his son had taken away a pistol that he owned in by  his bedside.  Today he denies any suicidal ideations.  06/17/2021  Corey [name] returns for follow-up.  More recently he has noted some shortness of breath and fatigue with exercise.  Over the summer it was worse when trying to mow the lawn.  He also noted it seems to be worse when he wakes up in the morning but gradually improves somewhat during the day.  He also has symptoms when walking up steps or carrying things.  He denies any frank chest pain.  He did undergo treadmill stress testing in 2020 which showed some mild ST segment depressions and was considered abnormal.  We did not do any follow-up testing at the time as he was asymptomatic.  This was mainly to assess EKG changes on flecainide .  Now it seems like he may be symptomatic.  09/18/2022  Corey [name] is seen today in follow-up.  He seems to be doing well.  He does get some shortness of breath and fatigue with exercise.  He underwent cardiopulmonary exercise testing from pulmonary and may have had some increased in oxygen utilization with exercise possibly related to pulmonary hypertension.  He does note that recently he has been having a lower blood pressure.  He had some systolics in the 90s but generally it runs of just over 100 systolic.  This was after addition of amlodipine  to his valsartan  and HCTZ.  Prior syncopal episode.  Recurrent A-fib on flecainide .  06/05/2023  Corey [name] is seen today as an add-on for worsening shortness of breath and fatigue.  He reports over the past several months he has had progressive fatigue and dyspnea on exertion.  He was recently diagnosed with a neuroendocrine tumor.  He has been undergoing therapies and oncology.  I was notified by his oncologist yesterday that he had worsening shortness of breath.  A chest x-ray was performed which suggested either early pneumonia or heart failure.  He has not had any fevers or productive cough rather a nonproductive cough with clear sputum has been noted.   He also gets short of breath when walking short distances and has noticed some worsening lower extremity swelling.  Oxygen saturations yesterday in the office were apparently in the low 80s.  Today is 92% on room air.  A proBNP was ordered today however remains pending.  Pertinent recent labs yesterday showed hyponatremia with a sodium of 130 and mildly elevated creatinine  at 1.29.  CBC only notable for thrombocytopenia with a platelet count of 130,000.  Past Medical History:  Diagnosis Date   Basal cell carcinoma of auricle of right ear    BPH (benign prostatic hyperplasia)    DJD (degenerative joint disease)    left   ED (erectile dysfunction)    Fatigue    Hearing loss, sensorineural    Hx of adenomatous colonic polyps    Hx of major depression    Hypertension    Iron deficiency anemia    Nephrolithiasis    Paroxysmal atrial fibrillation (HCC)    Seasonal allergic rhinitis     Past Surgical History:  Procedure Laterality Date   CARDIOVERSION N/A 08/13/2015   Procedure: CARDIOVERSION;  Surgeon: Vinie JAYSON Maxcy, MD;  Location: [facility] ENDOSCOPY;  Service: Cardiovascular;  Laterality: N/A;   CARDIOVERSION N/A 04/04/2016   Procedure: CARDIOVERSION;  Surgeon: Wilbert JONELLE Bihari, MD;  Location: [facility] ENDOSCOPY;  Service: Cardiovascular;  Laterality: N/A;   CARDIOVERSION N/A 11/12/2017   Procedure: CARDIOVERSION;  Surgeon: Maxcy Vinie JAYSON, MD;  Location: [facility] ENDOSCOPY;  Service: Cardiovascular;  Laterality: N/A;   CHOLECYSTECTOMY     colonscopy     herniorraphy Bilateral    had surgery x2 on the left  and right   TONSILLECTOMY AND ADENOIDECTOMY       Current Outpatient Medications  Medication Sig Dispense Refill   amLODipine  (NORVASC ) 2.5 MG tablet Take 1 tablet (2.5 mg total) by mouth daily. 90 tablet 3   apixaban  (ELIQUIS ) 5 MG TABS tablet Take 5 mg by mouth 2 (two) times daily.     atorvastatin  (LIPITOR) 20 MG tablet Take 20 mg by mouth daily.     buPROPion  (WELLBUTRIN  XL) 150 MG 24 hr  tablet Take 150 mg by mouth every morning.     busPIRone  (BUSPAR ) 5 MG tablet Take 5 mg by mouth at bedtime.     flecainide  (TAMBOCOR ) 100 MG tablet Take 1 tablet (100 mg total) by mouth 2 (two) times daily. 180 tablet 1   furosemide  (LASIX ) 40 MG tablet Take 1 tablet (40 mg total) by mouth daily. 90 tablet 3   LORazepam (ATIVAN) 0.5 MG tablet Take 0.5 mg by mouth daily.     mirtazapine  (REMERON ) 15 MG tablet Take 15 mg by mouth at bedtime.     omeprazole  (PRILOSEC) 20 MG capsule Take 1 capsule by mouth once daily 30 capsule 0   traMADol  (ULTRAM ) 50 MG tablet Take 1 tablet (50 mg total) by mouth every 6 (six) hours as needed. 15 tablet 0   valsartan -hydrochlorothiazide  (DIOVAN -HCT) 320-12.5 MG tablet Take 1 tablet by mouth daily.     vitamin B-12 (CYANOCOBALAMIN ) 500 MCG tablet Take 500 mcg by mouth daily. Patient is taking it 3 times a week     buPROPion  (WELLBUTRIN  SR) 150 MG 12 hr tablet Take 150 mg by mouth daily. (Patient not taking: Reported on 06/05/2023)     mirtazapine  (REMERON ) 30 MG tablet Take 15 mg by mouth daily. (Patient not taking: Reported on 06/05/2023)     valsartan -hydrochlorothiazide  (DIOVAN -HCT) 160-12.5 MG tablet Take 1 tablet by mouth daily. (Patient not taking: Reported on 06/05/2023)     No current facility-administered medications for this visit.    Allergies:   Patient has no known allergies.    Social History:  The patient  reports that he quit smoking about 62 years ago. His smoking use included cigarettes. He started smoking about 64 years ago. He has a 1.5 pack-year smoking  history. He has never used smokeless tobacco. He reports that he does not drink alcohol  and does not use drugs.   Family History:  The patient's family history includes Alcoholism in his daughter; Arrhythmia in his daughter; CAD in his mother; Cancer - Prostate in his brother; Healthy in his daughter and son; Heart attack in his father; Heart disease in his mother. history father died of a heart  attack at age 37.  Mother died of postoperative hemorrhage after heart surgery at age 80   ROS:  Pertinent items noted in HPI and remainder of comprehensive ROS otherwise negative.  PHYSICAL EXAM: VS:  BP (!) 102/58 (BP Location: Left Arm, Patient Position: Sitting, Cuff Size: Normal)   Pulse 71   Wt 193 lb 9.6 oz (87.8 kg)   SpO2 92%   BMI 26.26 kg/m  , BMI Body mass index is 26.26 kg/m. General appearance: alert and no distress Neck: JVD - 3 cm above sternal notch, no carotid bruit, and thyroid  not enlarged, symmetric, no tenderness/mass/nodules Lungs: diminished breath sounds bibasilar and rales RLL Heart: S1, S2 normal and RRR, nl S1/S2, no S3, no M/R/G's Abdomen: mildly protuberant, no fluid wave Extremities: edema 2+ bilateral LE edema Pulses: 2+ and symmetric Skin: pale, cool, dry Neurologic: Mental status: Alert, oriented, thought content appropriate Psych: pleasant  EKG:  EKG Interpretation Date/Time:  Tuesday June 05 2023 14:39:40 EST Ventricular Rate:  71 PR Interval:  206 QRS Duration:  158 QT Interval:  498 QTC Calculation: 541 R Axis:   -42  Text Interpretation: Normal sinus rhythm Left axis deviation Right bundle branch block When compared with ECG of 27-Jun-2021 11:44, Sinus rhythm has replaced Wide QRS rhythm Confirmed by Mona Kent [phone]) on 06/05/2023 5:10:27 PM    Recent Labs: 06/04/2023: ALT 44; BUN 22; Creatinine, Ser 1.29; Hemoglobin 13.5; Platelets 130; Potassium 3.5; Sodium 130    Lipid Panel    Component Value Date/Time   CHOL 148 01/31/2017 0849   TRIG 70 01/31/2017 0849   HDL 69 01/31/2017 0849   CHOLHDL 2.1 01/31/2017 0849   CHOLHDL 2.1 02/16/2016 0826   VLDL 17 02/16/2016 0826   LDLCALC 65 01/31/2017 0849      Wt Readings from Last 3 Encounters:  06/05/23 193 lb 9.6 oz (87.8 kg)  06/04/23 193 lb 9.6 oz (87.8 kg)  04/23/23 197 lb (89.4 kg)    ASSESSMENT AND PLAN:  1.  Acute congestive heart failure 2.  Atrial fibrillation  - CHADSVASC score of 3 on Eliquis  3.  Essential hypertension 4.  History of hypercholesterolemia 5.  DOE -abnormal exercise treadmill stress test 6.  Depression with suicidal ideation  PLAN:    Corey [name] appears to have an element of acute congestive heart failure.  His last echo was in 2017 and showed a low normal LVEF 50 to 55%.  He did have stress testing in 2023 which showed no ischemia and an EF of 51%.  I am concerned that he has developed new systolic congestive heart failure.  Will start Lasix  40 mg daily today.  BNP is pending.  Plan repeat echocardiogram.  Will reach out to him next week to see if his symptoms are improving.  He does have some worsening lower extremity edema and crackles in the right lung base.  Plan repeat labs in about 1 to 2 weeks.  Depending on echo findings, we may need a repeat ischemia evaluation.  He had a coronary CT in 2020 which showed a high calcium  score  of 902 but only mild mixed proximal LAD stenosis.  Will arrange for follow-up in about 2 to 4 weeks with me or APP.  Vinie KYM Maxcy, MD, [facility], FACP  [place]  [facility]  Medical Director of the Advanced Lipid Disorders &  Cardiovascular Risk Reduction Clinic Diplomate of the American Board of Clinical Lipidology Attending Cardiologist  Direct Dial: [phone]  Fax: [phone]  Website:  [email or web address]  06/05/2023 5:10 PM

## 2023-06-05 NOTE — Patient Instructions (Addendum)
 Medication Instructions:  START Lasix  40 mg daily  Take 1 tablet tonight and then every morning starting tomorrow  *If you need a refill on your cardiac medications before your next appointment, please call your pharmacy*   Lab Work: BMP ProBNP  In 2 weeks  If you have labs (blood work) drawn today and your tests are completely normal, you will receive your results only by: MyChart Message (if you have MyChart) OR A paper copy in the mail If you have any lab test that is abnormal or we need to change your treatment, we will call you to review the results.   Testing/Procedures: Schedule ASAP Echo will be scheduled at [address].  Your physician has requested that you have an echocardiogram. Echocardiography is a painless test that uses sound waves to create images of your heart. It provides your doctor with information about the size and shape of your heart and how well your heart's chambers and valves are working. This procedure takes approximately one hour. There are no restrictions for this procedure. Please do NOT wear cologne, perfume, aftershave, or lotions (deodorant is allowed). Please arrive 15 minutes prior to your appointment time.    Follow-Up: At [facility], you and your health needs are our priority.  As part of our continuing mission to provide you with exceptional heart care, we have created designated Provider Care Teams.  These Care Teams include your primary Cardiologist (physician) and Advanced Practice Providers (APPs -  Physician Assistants and Nurse Practitioners) who all work together to provide you with the care you need, when you need it.  We recommend signing up for the patient portal called MyChart.  Sign up information is provided on this After Visit Summary.  MyChart is used to connect with patients for Virtual Visits (Telemedicine).  Patients are able to view lab/test results, encounter notes, upcoming appointments, etc.   Non-urgent messages can be sent to your provider as well.   To learn more about what you can do with MyChart, go to [email or web address].    Your next appointment:   2-4 weeks with Dr. Mona or APP

## 2023-06-06 LAB — CHROMOGRANIN A: Chromogranin A (ng/mL): 230.6 ng/mL — ABNORMAL HIGH (ref 0.0–101.8)

## 2023-06-06 LAB — MISC LABCORP TEST (SEND OUT): Labcorp test code: 143000

## 2023-06-07 ENCOUNTER — Other Ambulatory Visit: Payer: Self-pay

## 2023-06-07 ENCOUNTER — Encounter (HOSPITAL_COMMUNITY): Payer: Self-pay

## 2023-06-07 ENCOUNTER — Emergency Department (HOSPITAL_COMMUNITY): Payer: Medicare Other

## 2023-06-07 ENCOUNTER — Ambulatory Visit (HOSPITAL_COMMUNITY): Admission: RE | Admit: 2023-06-07 | Payer: Medicare Other | Source: Ambulatory Visit

## 2023-06-07 ENCOUNTER — Inpatient Hospital Stay (HOSPITAL_COMMUNITY)
Admission: EM | Admit: 2023-06-07 | Discharge: 2023-06-30 | DRG: 193 | Disposition: E | Payer: Medicare Other | Attending: Internal Medicine | Admitting: Internal Medicine

## 2023-06-07 ENCOUNTER — Observation Stay (HOSPITAL_BASED_OUTPATIENT_CLINIC_OR_DEPARTMENT_OTHER): Payer: Medicare Other

## 2023-06-07 DIAGNOSIS — R531 Weakness: Secondary | ICD-10-CM | POA: Diagnosis not present

## 2023-06-07 DIAGNOSIS — I700 Atherosclerosis of aorta: Secondary | ICD-10-CM | POA: Diagnosis present

## 2023-06-07 DIAGNOSIS — J810 Acute pulmonary edema: Secondary | ICD-10-CM | POA: Diagnosis present

## 2023-06-07 DIAGNOSIS — E1165 Type 2 diabetes mellitus with hyperglycemia: Secondary | ICD-10-CM | POA: Diagnosis present

## 2023-06-07 DIAGNOSIS — R160 Hepatomegaly, not elsewhere classified: Secondary | ICD-10-CM | POA: Diagnosis present

## 2023-06-07 DIAGNOSIS — Z85828 Personal history of other malignant neoplasm of skin: Secondary | ICD-10-CM

## 2023-06-07 DIAGNOSIS — N1831 Chronic kidney disease, stage 3a: Secondary | ICD-10-CM | POA: Diagnosis present

## 2023-06-07 DIAGNOSIS — E876 Hypokalemia: Secondary | ICD-10-CM | POA: Diagnosis present

## 2023-06-07 DIAGNOSIS — K3189 Other diseases of stomach and duodenum: Secondary | ICD-10-CM | POA: Diagnosis not present

## 2023-06-07 DIAGNOSIS — Z7189 Other specified counseling: Secondary | ICD-10-CM

## 2023-06-07 DIAGNOSIS — N179 Acute kidney failure, unspecified: Secondary | ICD-10-CM | POA: Diagnosis not present

## 2023-06-07 DIAGNOSIS — Z87442 Personal history of urinary calculi: Secondary | ICD-10-CM

## 2023-06-07 DIAGNOSIS — F32A Depression, unspecified: Secondary | ICD-10-CM | POA: Diagnosis present

## 2023-06-07 DIAGNOSIS — Z1152 Encounter for screening for COVID-19: Secondary | ICD-10-CM

## 2023-06-07 DIAGNOSIS — J168 Pneumonia due to other specified infectious organisms: Principal | ICD-10-CM | POA: Diagnosis present

## 2023-06-07 DIAGNOSIS — I4430 Unspecified atrioventricular block: Secondary | ICD-10-CM | POA: Diagnosis not present

## 2023-06-07 DIAGNOSIS — E1122 Type 2 diabetes mellitus with diabetic chronic kidney disease: Secondary | ICD-10-CM | POA: Diagnosis present

## 2023-06-07 DIAGNOSIS — B49 Unspecified mycosis: Secondary | ICD-10-CM | POA: Diagnosis present

## 2023-06-07 DIAGNOSIS — F419 Anxiety disorder, unspecified: Secondary | ICD-10-CM | POA: Diagnosis present

## 2023-06-07 DIAGNOSIS — R443 Hallucinations, unspecified: Secondary | ICD-10-CM | POA: Diagnosis not present

## 2023-06-07 DIAGNOSIS — E785 Hyperlipidemia, unspecified: Secondary | ICD-10-CM | POA: Diagnosis present

## 2023-06-07 DIAGNOSIS — T426X5A Adverse effect of other antiepileptic and sedative-hypnotic drugs, initial encounter: Secondary | ICD-10-CM | POA: Diagnosis not present

## 2023-06-07 DIAGNOSIS — K5903 Drug induced constipation: Secondary | ICD-10-CM | POA: Diagnosis not present

## 2023-06-07 DIAGNOSIS — Z8249 Family history of ischemic heart disease and other diseases of the circulatory system: Secondary | ICD-10-CM

## 2023-06-07 DIAGNOSIS — D630 Anemia in neoplastic disease: Secondary | ICD-10-CM | POA: Diagnosis present

## 2023-06-07 DIAGNOSIS — R0602 Shortness of breath: Secondary | ICD-10-CM

## 2023-06-07 DIAGNOSIS — Z9049 Acquired absence of other specified parts of digestive tract: Secondary | ICD-10-CM

## 2023-06-07 DIAGNOSIS — D849 Immunodeficiency, unspecified: Secondary | ICD-10-CM | POA: Diagnosis present

## 2023-06-07 DIAGNOSIS — I452 Bifascicular block: Secondary | ICD-10-CM | POA: Diagnosis not present

## 2023-06-07 DIAGNOSIS — J9601 Acute respiratory failure with hypoxia: Secondary | ICD-10-CM | POA: Diagnosis present

## 2023-06-07 DIAGNOSIS — I480 Paroxysmal atrial fibrillation: Secondary | ICD-10-CM | POA: Diagnosis present

## 2023-06-07 DIAGNOSIS — I5021 Acute systolic (congestive) heart failure: Secondary | ICD-10-CM

## 2023-06-07 DIAGNOSIS — R4589 Other symptoms and signs involving emotional state: Secondary | ICD-10-CM

## 2023-06-07 DIAGNOSIS — I4510 Unspecified right bundle-branch block: Secondary | ICD-10-CM | POA: Diagnosis not present

## 2023-06-07 DIAGNOSIS — C7B8 Other secondary neuroendocrine tumors: Secondary | ICD-10-CM | POA: Diagnosis present

## 2023-06-07 DIAGNOSIS — J189 Pneumonia, unspecified organism: Principal | ICD-10-CM | POA: Diagnosis present

## 2023-06-07 DIAGNOSIS — R059 Cough, unspecified: Secondary | ICD-10-CM | POA: Diagnosis not present

## 2023-06-07 DIAGNOSIS — Z743 Need for continuous supervision: Secondary | ICD-10-CM | POA: Diagnosis not present

## 2023-06-07 DIAGNOSIS — I499 Cardiac arrhythmia, unspecified: Secondary | ICD-10-CM | POA: Diagnosis not present

## 2023-06-07 DIAGNOSIS — R001 Bradycardia, unspecified: Secondary | ICD-10-CM | POA: Diagnosis not present

## 2023-06-07 DIAGNOSIS — N400 Enlarged prostate without lower urinary tract symptoms: Secondary | ICD-10-CM | POA: Diagnosis present

## 2023-06-07 DIAGNOSIS — I2510 Atherosclerotic heart disease of native coronary artery without angina pectoris: Secondary | ICD-10-CM | POA: Diagnosis present

## 2023-06-07 DIAGNOSIS — E875 Hyperkalemia: Secondary | ICD-10-CM | POA: Diagnosis not present

## 2023-06-07 DIAGNOSIS — E871 Hypo-osmolality and hyponatremia: Secondary | ICD-10-CM | POA: Diagnosis present

## 2023-06-07 DIAGNOSIS — C7A8 Other malignant neuroendocrine tumors: Secondary | ICD-10-CM | POA: Diagnosis present

## 2023-06-07 DIAGNOSIS — E8809 Other disorders of plasma-protein metabolism, not elsewhere classified: Secondary | ICD-10-CM | POA: Diagnosis present

## 2023-06-07 DIAGNOSIS — Z860101 Personal history of adenomatous and serrated colon polyps: Secondary | ICD-10-CM

## 2023-06-07 DIAGNOSIS — T380X5A Adverse effect of glucocorticoids and synthetic analogues, initial encounter: Secondary | ICD-10-CM | POA: Diagnosis present

## 2023-06-07 DIAGNOSIS — Z66 Do not resuscitate: Secondary | ICD-10-CM | POA: Diagnosis not present

## 2023-06-07 DIAGNOSIS — Z6825 Body mass index (BMI) 25.0-25.9, adult: Secondary | ICD-10-CM

## 2023-06-07 DIAGNOSIS — K219 Gastro-esophageal reflux disease without esophagitis: Secondary | ICD-10-CM | POA: Diagnosis present

## 2023-06-07 DIAGNOSIS — Z515 Encounter for palliative care: Secondary | ICD-10-CM

## 2023-06-07 DIAGNOSIS — T40605A Adverse effect of unspecified narcotics, initial encounter: Secondary | ICD-10-CM | POA: Diagnosis not present

## 2023-06-07 DIAGNOSIS — R64 Cachexia: Secondary | ICD-10-CM | POA: Diagnosis present

## 2023-06-07 DIAGNOSIS — I5033 Acute on chronic diastolic (congestive) heart failure: Secondary | ICD-10-CM | POA: Diagnosis present

## 2023-06-07 DIAGNOSIS — C799 Secondary malignant neoplasm of unspecified site: Secondary | ICD-10-CM

## 2023-06-07 DIAGNOSIS — J439 Emphysema, unspecified: Secondary | ICD-10-CM | POA: Diagnosis present

## 2023-06-07 DIAGNOSIS — R54 Age-related physical debility: Secondary | ICD-10-CM | POA: Diagnosis present

## 2023-06-07 DIAGNOSIS — R9389 Abnormal findings on diagnostic imaging of other specified body structures: Secondary | ICD-10-CM | POA: Diagnosis not present

## 2023-06-07 DIAGNOSIS — C787 Secondary malignant neoplasm of liver and intrahepatic bile duct: Secondary | ICD-10-CM | POA: Diagnosis present

## 2023-06-07 DIAGNOSIS — R7402 Elevation of levels of lactic acid dehydrogenase (LDH): Secondary | ICD-10-CM | POA: Diagnosis not present

## 2023-06-07 DIAGNOSIS — Z7901 Long term (current) use of anticoagulants: Secondary | ICD-10-CM

## 2023-06-07 DIAGNOSIS — I130 Hypertensive heart and chronic kidney disease with heart failure and stage 1 through stage 4 chronic kidney disease, or unspecified chronic kidney disease: Secondary | ICD-10-CM | POA: Diagnosis present

## 2023-06-07 DIAGNOSIS — R7982 Elevated C-reactive protein (CRP): Secondary | ICD-10-CM | POA: Diagnosis present

## 2023-06-07 DIAGNOSIS — D3A8 Other benign neuroendocrine tumors: Secondary | ICD-10-CM | POA: Diagnosis present

## 2023-06-07 DIAGNOSIS — Z79899 Other long term (current) drug therapy: Secondary | ICD-10-CM

## 2023-06-07 DIAGNOSIS — E43 Unspecified severe protein-calorie malnutrition: Secondary | ICD-10-CM | POA: Diagnosis not present

## 2023-06-07 DIAGNOSIS — Z87891 Personal history of nicotine dependence: Secondary | ICD-10-CM

## 2023-06-07 DIAGNOSIS — H905 Unspecified sensorineural hearing loss: Secondary | ICD-10-CM | POA: Diagnosis present

## 2023-06-07 LAB — CBC WITH DIFFERENTIAL/PLATELET
Abs Immature Granulocytes: 0.04 10*3/uL (ref 0.00–0.07)
Basophils Absolute: 0 10*3/uL (ref 0.0–0.1)
Basophils Relative: 1 %
Eosinophils Absolute: 0 10*3/uL (ref 0.0–0.5)
Eosinophils Relative: 1 %
HCT: 39 % (ref 39.0–52.0)
Hemoglobin: 13.5 g/dL (ref 13.0–17.0)
Immature Granulocytes: 1 %
Lymphocytes Relative: 8 %
Lymphs Abs: 0.5 10*3/uL — ABNORMAL LOW (ref 0.7–4.0)
MCH: 30.1 pg (ref 26.0–34.0)
MCHC: 34.6 g/dL (ref 30.0–36.0)
MCV: 86.9 fL (ref 80.0–100.0)
Monocytes Absolute: 0.9 10*3/uL (ref 0.1–1.0)
Monocytes Relative: 13 %
Neutro Abs: 4.9 10*3/uL (ref 1.7–7.7)
Neutrophils Relative %: 76 %
Platelets: 151 10*3/uL (ref 150–400)
RBC: 4.49 MIL/uL (ref 4.22–5.81)
RDW: 13.4 % (ref 11.5–15.5)
WBC: 6.4 10*3/uL (ref 4.0–10.5)
nRBC: 0 % (ref 0.0–0.2)

## 2023-06-07 LAB — URINALYSIS, W/ REFLEX TO CULTURE (INFECTION SUSPECTED)
Bacteria, UA: NONE SEEN
Bilirubin Urine: NEGATIVE
Glucose, UA: NEGATIVE mg/dL
Hgb urine dipstick: NEGATIVE
Ketones, ur: NEGATIVE mg/dL
Leukocytes,Ua: NEGATIVE
Nitrite: NEGATIVE
Protein, ur: 30 mg/dL — AB
Specific Gravity, Urine: 1.014 (ref 1.005–1.030)
pH: 5 (ref 5.0–8.0)

## 2023-06-07 LAB — COMPREHENSIVE METABOLIC PANEL
ALT: 36 U/L (ref 0–44)
AST: 49 U/L — ABNORMAL HIGH (ref 15–41)
Albumin: 2.8 g/dL — ABNORMAL LOW (ref 3.5–5.0)
Alkaline Phosphatase: 456 U/L — ABNORMAL HIGH (ref 38–126)
Anion gap: 11 (ref 5–15)
BUN: 17 mg/dL (ref 8–23)
CO2: 27 mmol/L (ref 22–32)
Calcium: 8.4 mg/dL — ABNORMAL LOW (ref 8.9–10.3)
Chloride: 91 mmol/L — ABNORMAL LOW (ref 98–111)
Creatinine, Ser: 1.11 mg/dL (ref 0.61–1.24)
GFR, Estimated: >60 mL/min (ref >60)
Glucose, Bld: 143 mg/dL — ABNORMAL HIGH (ref 70–99)
Potassium: 3.5 mmol/L (ref 3.5–5.1)
Sodium: 129 mmol/L — ABNORMAL LOW (ref 135–145)
Total Bilirubin: 1.5 mg/dL — ABNORMAL HIGH (ref 0.0–1.2)
Total Protein: 6.5 g/dL (ref 6.5–8.1)

## 2023-06-07 LAB — ECHOCARDIOGRAM COMPLETE
AR max vel: 2.16 cm2
AV Area VTI: 1.91 cm2
AV Area mean vel: 1.9 cm2
AV Mean grad: 5 mm[Hg]
AV Peak grad: 8.4 mm[Hg]
Ao pk vel: 1.45 m/s
Area-P 1/2: 2.34 cm2
Calc EF: 50.5 %
S' Lateral: 3.7 cm
Single Plane A2C EF: 50.4 %
Single Plane A4C EF: 53.9 %

## 2023-06-07 LAB — BRAIN NATRIURETIC PEPTIDE: B Natriuretic Peptide: 302.2 pg/mL — ABNORMAL HIGH (ref 0.0–100.0)

## 2023-06-07 LAB — TROPONIN I (HIGH SENSITIVITY)
Troponin I (High Sensitivity): 16 ng/L (ref <18)
Troponin I (High Sensitivity): 16 ng/L (ref <18)

## 2023-06-07 LAB — LIPASE, BLOOD: Lipase: 38 U/L (ref 11–51)

## 2023-06-07 MED ORDER — SODIUM CHLORIDE 0.9 % IV BOLUS
1000.0000 mL | Freq: Once | INTRAVENOUS | Status: AC
  Administered 2023-06-07: 1000 mL via INTRAVENOUS

## 2023-06-07 MED ORDER — FLECAINIDE ACETATE 100 MG PO TABS
100.0000 mg | ORAL_TABLET | Freq: Two times a day (BID) | ORAL | Status: DC
  Administered 2023-06-07 – 2023-06-18 (×23): 100 mg via ORAL
  Filled 2023-06-07 (×26): qty 1

## 2023-06-07 MED ORDER — SODIUM CHLORIDE 0.9 % IV SOLN
1.0000 g | Freq: Once | INTRAVENOUS | Status: AC
  Administered 2023-06-07: 1 g via INTRAVENOUS
  Filled 2023-06-07: qty 10

## 2023-06-07 MED ORDER — BUSPIRONE HCL 5 MG PO TABS
5.0000 mg | ORAL_TABLET | Freq: Every day | ORAL | Status: DC
  Administered 2023-06-07 – 2023-06-18 (×12): 5 mg via ORAL
  Filled 2023-06-07 (×12): qty 1

## 2023-06-07 MED ORDER — PANTOPRAZOLE SODIUM 40 MG PO TBEC
40.0000 mg | DELAYED_RELEASE_TABLET | Freq: Every day | ORAL | Status: DC
  Administered 2023-06-07 – 2023-06-18 (×12): 40 mg via ORAL
  Filled 2023-06-07 (×13): qty 1

## 2023-06-07 MED ORDER — ATORVASTATIN CALCIUM 20 MG PO TABS
20.0000 mg | ORAL_TABLET | Freq: Every day | ORAL | Status: DC
  Administered 2023-06-07 – 2023-06-14 (×8): 20 mg via ORAL
  Filled 2023-06-07 (×2): qty 1
  Filled 2023-06-07: qty 2
  Filled 2023-06-07 (×2): qty 1
  Filled 2023-06-07: qty 2
  Filled 2023-06-07: qty 1
  Filled 2023-06-07 (×2): qty 2
  Filled 2023-06-07 (×2): qty 1
  Filled 2023-06-07: qty 2
  Filled 2023-06-07: qty 1
  Filled 2023-06-07: qty 2

## 2023-06-07 MED ORDER — ALBUTEROL SULFATE (2.5 MG/3ML) 0.083% IN NEBU
2.5000 mg | INHALATION_SOLUTION | RESPIRATORY_TRACT | Status: DC | PRN
  Administered 2023-06-14 – 2023-06-15 (×3): 2.5 mg via RESPIRATORY_TRACT
  Filled 2023-06-07 (×4): qty 3

## 2023-06-07 MED ORDER — ACETAMINOPHEN 325 MG PO TABS
650.0000 mg | ORAL_TABLET | Freq: Four times a day (QID) | ORAL | Status: DC | PRN
  Administered 2023-06-07 – 2023-06-14 (×5): 650 mg via ORAL
  Filled 2023-06-07 (×5): qty 2

## 2023-06-07 MED ORDER — BUPROPION HCL ER (XL) 150 MG PO TB24
150.0000 mg | ORAL_TABLET | Freq: Every morning | ORAL | Status: DC
  Administered 2023-06-08 – 2023-06-18 (×11): 150 mg via ORAL
  Filled 2023-06-07 (×12): qty 1

## 2023-06-07 MED ORDER — TRAZODONE HCL 50 MG PO TABS
25.0000 mg | ORAL_TABLET | Freq: Every evening | ORAL | Status: DC | PRN
  Filled 2023-06-07: qty 1

## 2023-06-07 MED ORDER — SODIUM CHLORIDE 0.9 % IV SOLN
2.0000 g | INTRAVENOUS | Status: DC
  Administered 2023-06-08 – 2023-06-12 (×5): 2 g via INTRAVENOUS
  Filled 2023-06-07 (×5): qty 20

## 2023-06-07 MED ORDER — ENSURE ENLIVE PO LIQD
237.0000 mL | Freq: Two times a day (BID) | ORAL | Status: DC
  Administered 2023-06-08 – 2023-06-18 (×15): 237 mL via ORAL

## 2023-06-07 MED ORDER — IRBESARTAN 300 MG PO TABS
300.0000 mg | ORAL_TABLET | Freq: Every day | ORAL | Status: DC
  Administered 2023-06-08 – 2023-06-13 (×6): 300 mg via ORAL
  Filled 2023-06-07 (×2): qty 2
  Filled 2023-06-07: qty 1
  Filled 2023-06-07 (×3): qty 2
  Filled 2023-06-07 (×5): qty 1

## 2023-06-07 MED ORDER — ONDANSETRON HCL 4 MG PO TABS: 4.0000 mg | ORAL_TABLET | Freq: Four times a day (QID) | ORAL | Status: DC | PRN

## 2023-06-07 MED ORDER — MIRTAZAPINE 15 MG PO TABS
15.0000 mg | ORAL_TABLET | Freq: Every day | ORAL | Status: DC
  Administered 2023-06-07 – 2023-06-17 (×11): 15 mg via ORAL
  Filled 2023-06-07 (×11): qty 1

## 2023-06-07 MED ORDER — ACETAMINOPHEN 650 MG RE SUPP: 650.0000 mg | Freq: Four times a day (QID) | RECTAL | Status: DC | PRN

## 2023-06-07 MED ORDER — SODIUM CHLORIDE 0.9 % IV SOLN
500.0000 mg | INTRAVENOUS | Status: DC
  Administered 2023-06-08 – 2023-06-09 (×2): 500 mg via INTRAVENOUS
  Filled 2023-06-07 (×2): qty 5

## 2023-06-07 MED ORDER — SODIUM CHLORIDE 0.9 % IV SOLN
500.0000 mg | Freq: Once | INTRAVENOUS | Status: AC
  Administered 2023-06-07: 500 mg via INTRAVENOUS
  Filled 2023-06-07: qty 5

## 2023-06-07 MED ORDER — ONDANSETRON HCL 4 MG/2ML IJ SOLN: 4.0000 mg | Freq: Four times a day (QID) | INTRAMUSCULAR | Status: DC | PRN

## 2023-06-07 MED ORDER — APIXABAN 5 MG PO TABS
5.0000 mg | ORAL_TABLET | Freq: Two times a day (BID) | ORAL | Status: DC
  Administered 2023-06-07 – 2023-06-18 (×23): 5 mg via ORAL
  Filled 2023-06-07 (×24): qty 1

## 2023-06-07 MED ORDER — VALSARTAN-HYDROCHLOROTHIAZIDE 320-12.5 MG PO TABS: 1.0000 | ORAL_TABLET | Freq: Every day | ORAL | Status: DC

## 2023-06-07 NOTE — ED Triage Notes (Signed)
 Pt BIB EMS from Home due to an increase in generalized weakness for about 1-2 weeks. Pt c/o weakness and SOB on exertion; lower left back pain. Pt reports recent diagnoses of liver cancer. Pt performs ADLs independently.

## 2023-06-07 NOTE — Plan of Care (Signed)

## 2023-06-07 NOTE — H&P (Addendum)
 History and Physical  Corey [name] FMW:[id number] DOB: 1935/03/12 DOA: 06/07/2023  PCP: Charlott Dorn LABOR, MD   Chief Complaint: Weakness, shortness of breath  HPI: Corey [name] is a 89 y.o. male with medical history significant for paroxysmal atrial fibrillation on Eliquis , BPH, neuroendocrine liver tumors with unknown primary being admitted to the hospital with hypoxic respiratory failure due to community-acquired pneumonia.  History is provided by the patient, his daughter, and his wife were at the bedside.  Unfortunately he was diagnosed with stage IV metastatic neuroendocrine tumor of unknown primary in November 2024.  Since that time, he was started on Afinitor  tour but was unable to tolerate it due to significant fatigue and cough.  He is awaiting consultation with Dr. Malva for Lutathera therapy, was supposed to have consultation with him today.  In the interim, patient has had continued cough and some dyspnea.  No fevers, cough is dry.  Was actually seen by his cardiologist 1/7, who based on peripheral edema and chest x-ray findings thought he may be in heart failure he was started on Lasix .  Patient had good response to Lasix , his peripheral edema has resolved, however he has continued cough and dyspnea.  Presented to the ER via EMS this morning, workup as detailed below shows evidence of progressive pulmonary consolidation.  He was given empiric IV azithromycin  IV Rocephin  and admitted to the hospitalist service.  Review of Systems: Please see HPI for pertinent positives and negatives. A complete 10 system review of systems are otherwise negative.  Past Medical History:  Diagnosis Date   Basal cell carcinoma of auricle of right ear    BPH (benign prostatic hyperplasia)    DJD (degenerative joint disease)    left   ED (erectile dysfunction)    Fatigue    Hearing loss, sensorineural    Hx of adenomatous colonic polyps    Hx of major depression    Hypertension     Iron deficiency anemia    Nephrolithiasis    Paroxysmal atrial fibrillation (HCC)    Seasonal allergic rhinitis    Past Surgical History:  Procedure Laterality Date   CARDIOVERSION N/A 08/13/2015   Procedure: CARDIOVERSION;  Surgeon: Vinie JAYSON Maxcy, MD;  Location: [facility] ENDOSCOPY;  Service: Cardiovascular;  Laterality: N/A;   CARDIOVERSION N/A 04/04/2016   Procedure: CARDIOVERSION;  Surgeon: Wilbert JONELLE Bihari, MD;  Location: [facility] ENDOSCOPY;  Service: Cardiovascular;  Laterality: N/A;   CARDIOVERSION N/A 11/12/2017   Procedure: CARDIOVERSION;  Surgeon: Maxcy Vinie JAYSON, MD;  Location: [facility] ENDOSCOPY;  Service: Cardiovascular;  Laterality: N/A;   CHOLECYSTECTOMY     colonscopy     herniorraphy Bilateral    had surgery x2 on the left  and right   TONSILLECTOMY AND ADENOIDECTOMY     Social History:  reports that he quit smoking about 62 years ago. His smoking use included cigarettes. He started smoking about 64 years ago. He has a 1.5 pack-year smoking history. He has never used smokeless tobacco. He reports that he does not drink alcohol  and does not use drugs.  No Known Allergies  Family History  Problem Relation Age of Onset   CAD Mother    Heart disease Mother    Heart attack Father    Cancer - Prostate Brother    Alcoholism Daughter    Arrhythmia Daughter    Healthy Son    Healthy Daughter      Prior to Admission medications   Medication Sig Start Date End Date Taking? Authorizing  Provider  amLODipine  (NORVASC ) 2.5 MG tablet Take 1 tablet (2.5 mg total) by mouth daily. 09/18/22   Hilty, Vinie BROCKS, MD  apixaban  (ELIQUIS ) 5 MG TABS tablet Take 5 mg by mouth 2 (two) times daily. 07/14/15   [provider]  atorvastatin  (LIPITOR) 20 MG tablet Take 20 mg by mouth daily.    [provider]  buPROPion  (WELLBUTRIN  XL) 150 MG 24 hr tablet Take 150 mg by mouth every morning. 05/13/23   [provider]  busPIRone  (BUSPAR ) 5 MG tablet Take 5 mg by mouth at bedtime.  02/05/23   [provider]  flecainide  (TAMBOCOR ) 100 MG tablet Take 1 tablet (100 mg total) by mouth 2 (two) times daily. 04/12/23   Hilty, Vinie BROCKS, MD  furosemide  (LASIX ) 40 MG tablet Take 1 tablet (40 mg total) by mouth daily. 06/05/23 09/03/23  Hilty, Vinie BROCKS, MD  LORazepam (ATIVAN) 0.5 MG tablet Take 0.5 mg by mouth daily.    [provider]  mirtazapine  (REMERON ) 15 MG tablet Take 15 mg by mouth at bedtime. 04/02/23   [provider]  mirtazapine  (REMERON ) 30 MG tablet Take 15 mg by mouth daily. Patient not taking: Reported on 06/05/2023    [provider]  omeprazole  (PRILOSEC) 20 MG capsule Take 1 capsule by mouth once daily 05/29/23   Lanny Callander, MD  traMADol  (ULTRAM ) 50 MG tablet Take 1 tablet (50 mg total) by mouth every 6 (six) hours as needed. 04/25/23   Lanny Callander, MD  valsartan -hydrochlorothiazide  (DIOVAN -HCT) 160-12.5 MG tablet Take 1 tablet by mouth daily. Patient not taking: Reported on 06/05/2023 09/15/21   [provider]  valsartan -hydrochlorothiazide  (DIOVAN -HCT) 320-12.5 MG tablet Take 1 tablet by mouth daily. 03/06/23   [provider]  vitamin B-12 (CYANOCOBALAMIN ) 500 MCG tablet Take 500 mcg by mouth daily. Patient is taking it 3 times a week    [provider]    Physical Exam: BP 133/73 (BP Location: Right Arm)   Pulse 72   Temp 99.3 F (37.4 C) (Oral)   Resp 16   SpO2 94%  General:  Alert, oriented, calm, in no acute distress, no cough, looks nontoxic, wearing 2 L nasal cannula oxygen, speaking in full sentences eating lunch with his family.  His wife and daughter are at the bedside. Cardiovascular: RRR, no murmurs or rubs, no peripheral edema  Respiratory: clear to auscultation bilaterally on anterior exam, no wheezes, no crackles  Abdomen: soft, nontender, nondistended, normal bowel tones heard  Skin: dry, no rashes  Musculoskeletal: no joint effusions, normal range of motion  Psychiatric: appropriate  affect, normal speech  Neurologic: extraocular muscles intact, clear speech, moving all extremities with intact sensorium         Labs on Admission:  Basic Metabolic Panel: Recent Labs  Lab 06/04/23 1315 06/07/23 0909  NA 130* 129*  K 3.5 3.5  CL 92* 91*  CO2 30 27  GLUCOSE 211* 143*  BUN 22 17  CREATININE 1.29* 1.11  CALCIUM  8.7* 8.4*   Liver Function Tests: Recent Labs  Lab 06/04/23 1315 06/07/23 0909  AST 56* 49*  ALT 44 36  ALKPHOS 584* 456*  BILITOT 1.3* 1.5*  PROT 7.0 6.5  ALBUMIN 3.6 2.8*   Recent Labs  Lab 06/07/23 0909  LIPASE 38   No results for input(s): AMMONIA in the last 168 hours. CBC: Recent Labs  Lab 06/04/23 1315 06/07/23 0909  WBC 5.3 6.4  NEUTROABS 3.8 4.9  HGB 13.5 13.5  HCT 39.5  39.0  MCV 87.4 86.9  PLT 130* 151   Cardiac Enzymes: No results for input(s): CKTOTAL, CKMB, CKMBINDEX, TROPONINI in the last 168 hours. BNP (last 3 results) Recent Labs    06/07/23 0909  BNP 302.2*    ProBNP (last 3 results) No results for input(s): PROBNP in the last 8760 hours.  CBG: No results for input(s): GLUCAP in the last 168 hours.  Radiological Exams on Admission: DG Chest 2 View Result Date: 06/07/2023 CLINICAL DATA:  Cough. EXAM: CHEST - 2 VIEW COMPARISON:  06/04/2023. FINDINGS: Increasing heterogeneous opacity predominantly overlying the left mid lower lung zone, laterally, compatible with left lung lower lobe pneumonia. There also faint heterogeneous opacities overlying the right lung, laterally, also concerning for developing pneumonia. Follow-up to clearing is recommended. Bilateral costophrenic angles are clear. No pneumothorax. Note is made of elevated right hemidiaphragm. Stable cardio-mediastinal silhouette. No acute osseous abnormalities. The soft tissues are within normal limits. IMPRESSION: *Multilobar pneumonia, as described above. Follow-up to clearing is recommended. Electronically Signed   By: Ree Molt M.D.    On: 06/07/2023 09:03   Assessment/Plan Corey [name] is a 89 y.o. male with medical history significant for paroxysmal atrial fibrillation on Eliquis , BPH, neuroendocrine liver tumors with unknown primary being admitted to the hospital with hypoxic respiratory failure due to community-acquired pneumonia.   Community-acquired pneumonia-with progressive cough, hypoxia, multilobar pneumonia seen on chest x-ray today, progressive when compared to x-ray from 06/04/2023.  No fever, leukocytosis, or other evidence of sepsis. -Observation admission -Supplemental oxygen if needed -Empiric IV azithromycin  and IV Rocephin  -Incentive spirometer, and flutter valve  Acute hypoxic respiratory failure-patient reports pulse ox of 85% on room air at home.  Etiology of hypoxia is most likely due to community-acquired pneumonia, PE was considered the setting of malignancy but unlikely as patient has no pleuritic chest pain and is anticoagulated on Eliquis .  Hyponatremia-mild and asymptomatic, I suspect this is hypovolemic hyponatremia due to recent reduced oral intake, as well as diuretic use. -Hold further diuresis, as well as home HCTZ -Given 1 L IV fluids in the emergency department, will hold off on further fluids -Recheck sodium level with morning labs  Suspected heart failure-he responded well to several days of p.o. Lasix , currently has no evidence of fluid overload. -Hold off on further diuresis since euvolemic, and has some hyponatremia -Check 2D echo, which was ordered for later this month by Dr. Mona -Plan outpatient cardiology follow-up  Stage IV neuroendocrine tumor-plan outpatient follow-up with Dr. Lanny and Dr. Malva as previously planned  Paroxysmal atrial fibrillation-continue Tambocor , Eliquis  anticoagulation  Hypertension-continue home ARB  Depression-Wellbutrin   GERD-Protonix   Hyperlipidemia-Lipitor  DVT prophylaxis: Eliquis     Code Status: Full Code  Consults called:  None  Admission status: Observation  Time spent: 48 minutes  Shatyra Becka CHRISTELLA Gail MD [facility] Pager [phone]  If 7PM-7AM, please contact night-coverage [email or web address] Password [facility]  06/07/2023, 12:32 PM

## 2023-06-07 NOTE — Progress Notes (Signed)
  Echocardiogram 2D Echocardiogram has been performed.  Corey [name] Corey [name] 06/07/2023, 2:16 PM

## 2023-06-07 NOTE — ED Provider Notes (Signed)
 [place] EMERGENCY DEPARTMENT AT [facility] Provider Note   CSN: [id number] Arrival date & time: 06/07/23  0732     History  Chief Complaint  Patient presents with   Weakness    STELLAN VICK is a 89 y.o. male.  Is an 89 year old male presenting emergency department for generalized weakness and shortness of breath.  Reports he has been declining over the past several months since diagnosed with cancer of liver.  Reports worsening generalized weakness over the past 2 weeks with seemingly acute worsening over the past 2 days of generalized weakness and cough.  Decreased oral intake.  Reports he went to his primary doctor 2 days ago and they gave him Lasix .  He states symptoms greatly worsened since then.  No nausea no vomiting.   Weakness      Home Medications Prior to Admission medications   Medication Sig Start Date End Date Taking? Authorizing Provider  amLODipine  (NORVASC ) 2.5 MG tablet Take 1 tablet (2.5 mg total) by mouth daily. 09/18/22   Hilty, Vinie BROCKS, MD  apixaban  (ELIQUIS ) 5 MG TABS tablet Take 5 mg by mouth 2 (two) times daily. 07/14/15   [provider]  atorvastatin  (LIPITOR) 20 MG tablet Take 20 mg by mouth daily.    [provider]  buPROPion  (WELLBUTRIN  SR) 150 MG 12 hr tablet Take 150 mg by mouth daily. Patient not taking: Reported on 06/05/2023    [provider]  buPROPion  (WELLBUTRIN  XL) 150 MG 24 hr tablet Take 150 mg by mouth every morning. 05/13/23   [provider]  busPIRone  (BUSPAR ) 5 MG tablet Take 5 mg by mouth at bedtime. 02/05/23   [provider]  flecainide  (TAMBOCOR ) 100 MG tablet Take 1 tablet (100 mg total) by mouth 2 (two) times daily. 04/12/23   Hilty, Vinie BROCKS, MD  furosemide  (LASIX ) 40 MG tablet Take 1 tablet (40 mg total) by mouth daily. 06/05/23 09/03/23  Hilty, Vinie BROCKS, MD  LORazepam (ATIVAN) 0.5 MG tablet Take 0.5 mg by mouth daily.    [provider]  mirtazapine   (REMERON ) 15 MG tablet Take 15 mg by mouth at bedtime. 04/02/23   [provider]  mirtazapine  (REMERON ) 30 MG tablet Take 15 mg by mouth daily. Patient not taking: Reported on 06/05/2023    [provider]  omeprazole  (PRILOSEC) 20 MG capsule Take 1 capsule by mouth once daily 05/29/23   Lanny Callander, MD  traMADol  (ULTRAM ) 50 MG tablet Take 1 tablet (50 mg total) by mouth every 6 (six) hours as needed. 04/25/23   Lanny Callander, MD  valsartan -hydrochlorothiazide  (DIOVAN -HCT) 160-12.5 MG tablet Take 1 tablet by mouth daily. Patient not taking: Reported on 06/05/2023 09/15/21   [provider]  valsartan -hydrochlorothiazide  (DIOVAN -HCT) 320-12.5 MG tablet Take 1 tablet by mouth daily. 03/06/23   [provider]  vitamin B-12 (CYANOCOBALAMIN ) 500 MCG tablet Take 500 mcg by mouth daily. Patient is taking it 3 times a week    [provider]      Allergies    Patient has no known allergies.    Review of Systems   Review of Systems  Neurological:  Positive for weakness.    Physical Exam Updated Vital Signs BP 133/73 (BP Location: Right Arm)   Pulse 72   Temp 97.8 F (36.6 C) (Oral)   Resp 16   SpO2 94%  Physical Exam Vitals and nursing note reviewed.  Constitutional:      General: He is not in acute  distress.    Appearance: He is not toxic-appearing.  HENT:     Head: Normocephalic.     Nose: Nose normal.     Mouth/Throat:     Mouth: Mucous membranes are dry.  Eyes:     Conjunctiva/sclera: Conjunctivae normal.  Cardiovascular:     Rate and Rhythm: Normal rate.  Pulmonary:     Effort: Pulmonary effort is normal.     Breath sounds: Normal breath sounds.     Comments: Oxygen saturation 90% good waveform.  Does not appear to be in overt respiratory distress.  Speaking in full sentences.  Lungs clear Abdominal:     General: Abdomen is flat.     Tenderness: There is no abdominal tenderness. There is no guarding or rebound.  Musculoskeletal:         General: Normal range of motion.     Right lower leg: No edema.     Left lower leg: No edema.  Skin:    General: Skin is warm.     Capillary Refill: Capillary refill takes less than 2 seconds.  Neurological:     Mental Status: He is alert and oriented to person, place, and time.  Psychiatric:        Mood and Affect: Mood normal.        Behavior: Behavior normal.     ED Results / Procedures / Treatments   Labs (all labs ordered are listed, but only abnormal results are displayed) Labs Reviewed  CBC WITH DIFFERENTIAL/PLATELET - Abnormal; Notable for the following components:      Result Value   Lymphs Abs 0.5 (*)    All other components within normal limits  COMPREHENSIVE METABOLIC PANEL - Abnormal; Notable for the following components:   Sodium 129 (*)    Chloride 91 (*)    Glucose, Bld 143 (*)    Calcium  8.4 (*)    Albumin 2.8 (*)    AST 49 (*)    Alkaline Phosphatase 456 (*)    Total Bilirubin 1.5 (*)    All other components within normal limits  BRAIN NATRIURETIC PEPTIDE - Abnormal; Notable for the following components:   B Natriuretic Peptide 302.2 (*)    All other components within normal limits  URINALYSIS, W/ REFLEX TO CULTURE (INFECTION SUSPECTED) - Abnormal; Notable for the following components:   Protein, ur 30 (*)    All other components within normal limits  CULTURE, BLOOD (ROUTINE X 2)  CULTURE, BLOOD (ROUTINE X 2)  LIPASE, BLOOD  TROPONIN I (HIGH SENSITIVITY)  TROPONIN I (HIGH SENSITIVITY)    EKG None  Radiology DG Chest 2 View Result Date: 06/07/2023 CLINICAL DATA:  Cough. EXAM: CHEST - 2 VIEW COMPARISON:  06/04/2023. FINDINGS: Increasing heterogeneous opacity predominantly overlying the left mid lower lung zone, laterally, compatible with left lung lower lobe pneumonia. There also faint heterogeneous opacities overlying the right lung, laterally, also concerning for developing pneumonia. Follow-up to clearing is recommended. Bilateral costophrenic  angles are clear. No pneumothorax. Note is made of elevated right hemidiaphragm. Stable cardio-mediastinal silhouette. No acute osseous abnormalities. The soft tissues are within normal limits. IMPRESSION: *Multilobar pneumonia, as described above. Follow-up to clearing is recommended. Electronically Signed   By: Ree Molt M.D.   On: 06/07/2023 09:03    Procedures Procedures    Medications Ordered in ED Medications  cefTRIAXone  (ROCEPHIN ) 1 g in sodium chloride  0.9 % 100 mL IVPB (has no administration in time range)  azithromycin  (ZITHROMAX ) 500 mg in sodium chloride  0.9 %  250 mL IVPB (has no administration in time range)  sodium chloride  0.9 % bolus 1,000 mL (1,000 mLs Intravenous New Bag/Given 06/07/23 1138)    ED Course/ Medical Decision Making/ A&P Clinical Course as of 06/07/23 1140  Thu Jun 07, 2023  1133 Patient reevaluated saturating 90% good waveform.  Subjectively complaining of some dyspnea.  Placed on 2 L of oxygen.  Saturating mid 90s. [TY]  1134 Workup today concerning for hyponatremia which I suspect is hypovolemic given his decreased p.o. intake and Lasix  use for the past 2 days.  Giving IV fluids.  Chest x-ray concerning for pneumonia.  Antibiotics ordered.  No leukocytosis on CBC however.  Cardiac etiology unlikely negative troponin and reassuring BNP.  UA without evidence of UTI.  Given patient's advanced age, comorbid medical conditions with chest x-ray concerning for pneumonia with borderline oxygen level as well as his hyponatremia will admit. [TY]    Clinical Course User Index [TY] Neysa Caron PARAS, DO                                 Medical Decision Making Is a 89 year old male presenting emergency department for generalized weakness.  Afebrile nontachycardic hemodynamically stable here.  No overt sources of bacterial infection on exam.  He does note some shortness of breath and cough over the past several days.  Per chart review does have history of hypertension,  paroxysmal A-fib, Metastatic malignant neuroendocrine tumor to liver (Stage IV with diffuse liver metastasis).  Will get broad cardiopulmonary workup as well as check for pneumonia given his cough.   Amount and/or Complexity of Data Reviewed Independent Historian:     Details: Wife notes that patient had fever last night  External Data Reviewed:     Details: Per cardiology note 2 days ago they noted hypoxia Labs: ordered. Decision-making details documented in ED Course. Radiology: ordered. Decision-making details documented in ED Course. ECG/medicine tests: ordered. Decision-making details documented in ED Course.  Risk Decision regarding hospitalization.          Final Clinical Impression(s) / ED Diagnoses Final diagnoses:  None    Rx / DC Orders ED Discharge Orders     None         Neysa Caron PARAS, DO 06/07/23 1140

## 2023-06-08 ENCOUNTER — Observation Stay (HOSPITAL_COMMUNITY): Payer: Medicare Other

## 2023-06-08 DIAGNOSIS — D3A8 Other benign neuroendocrine tumors: Secondary | ICD-10-CM | POA: Diagnosis not present

## 2023-06-08 DIAGNOSIS — I452 Bifascicular block: Secondary | ICD-10-CM | POA: Diagnosis not present

## 2023-06-08 DIAGNOSIS — B49 Unspecified mycosis: Secondary | ICD-10-CM | POA: Diagnosis not present

## 2023-06-08 DIAGNOSIS — J9601 Acute respiratory failure with hypoxia: Secondary | ICD-10-CM

## 2023-06-08 DIAGNOSIS — R531 Weakness: Secondary | ICD-10-CM | POA: Diagnosis present

## 2023-06-08 DIAGNOSIS — E1122 Type 2 diabetes mellitus with diabetic chronic kidney disease: Secondary | ICD-10-CM | POA: Diagnosis present

## 2023-06-08 DIAGNOSIS — R0600 Dyspnea, unspecified: Secondary | ICD-10-CM | POA: Diagnosis not present

## 2023-06-08 DIAGNOSIS — Z7189 Other specified counseling: Secondary | ICD-10-CM | POA: Diagnosis not present

## 2023-06-08 DIAGNOSIS — J810 Acute pulmonary edema: Secondary | ICD-10-CM | POA: Diagnosis not present

## 2023-06-08 DIAGNOSIS — R918 Other nonspecific abnormal finding of lung field: Secondary | ICD-10-CM | POA: Diagnosis not present

## 2023-06-08 DIAGNOSIS — C787 Secondary malignant neoplasm of liver and intrahepatic bile duct: Secondary | ICD-10-CM | POA: Diagnosis not present

## 2023-06-08 DIAGNOSIS — E871 Hypo-osmolality and hyponatremia: Secondary | ICD-10-CM | POA: Diagnosis present

## 2023-06-08 DIAGNOSIS — I700 Atherosclerosis of aorta: Secondary | ICD-10-CM | POA: Diagnosis not present

## 2023-06-08 DIAGNOSIS — J168 Pneumonia due to other specified infectious organisms: Secondary | ICD-10-CM | POA: Diagnosis present

## 2023-06-08 DIAGNOSIS — C7A8 Other malignant neuroendocrine tumors: Secondary | ICD-10-CM

## 2023-06-08 DIAGNOSIS — I509 Heart failure, unspecified: Secondary | ICD-10-CM | POA: Diagnosis not present

## 2023-06-08 DIAGNOSIS — E1165 Type 2 diabetes mellitus with hyperglycemia: Secondary | ICD-10-CM | POA: Diagnosis present

## 2023-06-08 DIAGNOSIS — D630 Anemia in neoplastic disease: Secondary | ICD-10-CM | POA: Diagnosis present

## 2023-06-08 DIAGNOSIS — I110 Hypertensive heart disease with heart failure: Secondary | ICD-10-CM

## 2023-06-08 DIAGNOSIS — Z1152 Encounter for screening for COVID-19: Secondary | ICD-10-CM | POA: Diagnosis not present

## 2023-06-08 DIAGNOSIS — E43 Unspecified severe protein-calorie malnutrition: Secondary | ICD-10-CM | POA: Diagnosis not present

## 2023-06-08 DIAGNOSIS — R0602 Shortness of breath: Secondary | ICD-10-CM | POA: Diagnosis not present

## 2023-06-08 DIAGNOSIS — C7B8 Other secondary neuroendocrine tumors: Secondary | ICD-10-CM | POA: Diagnosis not present

## 2023-06-08 DIAGNOSIS — Z515 Encounter for palliative care: Secondary | ICD-10-CM | POA: Diagnosis not present

## 2023-06-08 DIAGNOSIS — N1831 Chronic kidney disease, stage 3a: Secondary | ICD-10-CM | POA: Diagnosis present

## 2023-06-08 DIAGNOSIS — R059 Cough, unspecified: Secondary | ICD-10-CM | POA: Diagnosis not present

## 2023-06-08 DIAGNOSIS — F32A Depression, unspecified: Secondary | ICD-10-CM | POA: Diagnosis present

## 2023-06-08 DIAGNOSIS — R0989 Other specified symptoms and signs involving the circulatory and respiratory systems: Secondary | ICD-10-CM | POA: Diagnosis not present

## 2023-06-08 DIAGNOSIS — R64 Cachexia: Secondary | ICD-10-CM | POA: Diagnosis not present

## 2023-06-08 DIAGNOSIS — R748 Abnormal levels of other serum enzymes: Secondary | ICD-10-CM | POA: Diagnosis not present

## 2023-06-08 DIAGNOSIS — D849 Immunodeficiency, unspecified: Secondary | ICD-10-CM | POA: Diagnosis not present

## 2023-06-08 DIAGNOSIS — R160 Hepatomegaly, not elsewhere classified: Secondary | ICD-10-CM | POA: Diagnosis not present

## 2023-06-08 DIAGNOSIS — R443 Hallucinations, unspecified: Secondary | ICD-10-CM | POA: Diagnosis not present

## 2023-06-08 DIAGNOSIS — R140 Abdominal distension (gaseous): Secondary | ICD-10-CM | POA: Diagnosis not present

## 2023-06-08 DIAGNOSIS — J439 Emphysema, unspecified: Secondary | ICD-10-CM | POA: Diagnosis not present

## 2023-06-08 DIAGNOSIS — I5033 Acute on chronic diastolic (congestive) heart failure: Secondary | ICD-10-CM | POA: Diagnosis not present

## 2023-06-08 DIAGNOSIS — N179 Acute kidney failure, unspecified: Secondary | ICD-10-CM | POA: Diagnosis not present

## 2023-06-08 DIAGNOSIS — Z66 Do not resuscitate: Secondary | ICD-10-CM | POA: Diagnosis not present

## 2023-06-08 DIAGNOSIS — J189 Pneumonia, unspecified organism: Secondary | ICD-10-CM | POA: Diagnosis not present

## 2023-06-08 DIAGNOSIS — I5031 Acute diastolic (congestive) heart failure: Secondary | ICD-10-CM | POA: Diagnosis not present

## 2023-06-08 DIAGNOSIS — K2289 Other specified disease of esophagus: Secondary | ICD-10-CM | POA: Diagnosis not present

## 2023-06-08 DIAGNOSIS — R4589 Other symptoms and signs involving emotional state: Secondary | ICD-10-CM | POA: Diagnosis not present

## 2023-06-08 DIAGNOSIS — I130 Hypertensive heart and chronic kidney disease with heart failure and stage 1 through stage 4 chronic kidney disease, or unspecified chronic kidney disease: Secondary | ICD-10-CM | POA: Diagnosis present

## 2023-06-08 LAB — BLOOD CULTURE ID PANEL (REFLEXED) - BCID2

## 2023-06-08 LAB — COMPREHENSIVE METABOLIC PANEL
ALT: 30 U/L (ref 0–44)
AST: 41 U/L (ref 15–41)
Albumin: 2.4 g/dL — ABNORMAL LOW (ref 3.5–5.0)
Alkaline Phosphatase: 370 U/L — ABNORMAL HIGH (ref 38–126)
Anion gap: 9 (ref 5–15)
BUN: 15 mg/dL (ref 8–23)
CO2: 25 mmol/L (ref 22–32)
Calcium: 7.9 mg/dL — ABNORMAL LOW (ref 8.9–10.3)
Chloride: 98 mmol/L (ref 98–111)
Creatinine, Ser: 0.95 mg/dL (ref 0.61–1.24)
GFR, Estimated: >60 mL/min (ref >60)
Glucose, Bld: 119 mg/dL — ABNORMAL HIGH (ref 70–99)
Potassium: 3.2 mmol/L — ABNORMAL LOW (ref 3.5–5.1)
Sodium: 132 mmol/L — ABNORMAL LOW (ref 135–145)
Total Bilirubin: 1.3 mg/dL — ABNORMAL HIGH (ref 0.0–1.2)
Total Protein: 5.8 g/dL — ABNORMAL LOW (ref 6.5–8.1)

## 2023-06-08 LAB — CBC
HCT: 35.8 % — ABNORMAL LOW (ref 39.0–52.0)
Hemoglobin: 12 g/dL — ABNORMAL LOW (ref 13.0–17.0)
MCH: 29.4 pg (ref 26.0–34.0)
MCHC: 33.5 g/dL (ref 30.0–36.0)
MCV: 87.7 fL (ref 80.0–100.0)
Platelets: 162 10*3/uL (ref 150–400)
RBC: 4.08 MIL/uL — ABNORMAL LOW (ref 4.22–5.81)
RDW: 13.5 % (ref 11.5–15.5)
WBC: 5.4 10*3/uL (ref 4.0–10.5)
nRBC: 0 % (ref 0.0–0.2)

## 2023-06-08 MED ORDER — POTASSIUM CHLORIDE CRYS ER 20 MEQ PO TBCR
40.0000 meq | EXTENDED_RELEASE_TABLET | Freq: Two times a day (BID) | ORAL | Status: AC
  Administered 2023-06-08 (×2): 40 meq via ORAL
  Filled 2023-06-08 (×2): qty 2

## 2023-06-08 MED ORDER — LEVALBUTEROL HCL 0.63 MG/3ML IN NEBU
0.6300 mg | INHALATION_SOLUTION | Freq: Four times a day (QID) | RESPIRATORY_TRACT | Status: DC
  Administered 2023-06-08: 0.63 mg via RESPIRATORY_TRACT
  Filled 2023-06-08 (×2): qty 3

## 2023-06-08 MED ORDER — IPRATROPIUM BROMIDE 0.02 % IN SOLN
0.5000 mg | Freq: Four times a day (QID) | RESPIRATORY_TRACT | Status: DC
Start: 2023-06-08 — End: 2023-06-09
  Administered 2023-06-08: 0.5 mg via RESPIRATORY_TRACT
  Filled 2023-06-08 (×2): qty 2.5

## 2023-06-08 MED ORDER — GUAIFENESIN ER 600 MG PO TB12
1200.0000 mg | ORAL_TABLET | Freq: Two times a day (BID) | ORAL | Status: DC
  Administered 2023-06-08 – 2023-06-16 (×17): 1200 mg via ORAL
  Administered 2023-06-17: 600 mg via ORAL
  Administered 2023-06-17 – 2023-06-18 (×2): 1200 mg via ORAL
  Filled 2023-06-08 (×22): qty 2

## 2023-06-08 NOTE — Progress Notes (Signed)
 Corey [name]   DOB:Feb 25, 1935   FM#:[id number]   RDW#:[id number]  MED/ONC FOLLOW UP NOTE   Subjective: Patient is well-known to me, under my care for his metastatic neuroendocrine tumor.  He was seen by me and his cardiologist in office earlier this week.  Due to worsening dyspnea and fatigue, he presented to emergency room yesterday.  He was admitted and is on antibiotics for pneumonia.  He has not noticed much change since yesterday.  No fever.   Objective:  Vitals:   06/08/23 0449 06/08/23 1235  BP: 124/60 (!) 127/57  Pulse: 78 73  Resp: 16 20  Temp: 99.1 F (37.3 C) 98.2 F (36.8 C)  SpO2: 91% 91%    Body mass index is 25.22 kg/m.  Intake/Output Summary (Last 24 hours) at 06/08/2023 1623 Last data filed at 06/07/2023 1823 Gross per 24 hour  Intake 120 ml  Output --  Net 120 ml     Sclerae unicteric  Oropharynx clear  No peripheral adenopathy  Lungs clear -- no rales or rhonchi  Heart regular rate and rhythm  Abdomen benign  MSK no focal spinal tenderness, no peripheral edema  Neuro nonfocal    CBG (last 3)  No results for input(s): GLUCAP in the last 72 hours.   Labs:   Urine Studies No results for input(s): UHGB, CRYS in the last 72 hours.  Invalid input(s): UACOL, UAPR, USPG, UPH, UTP, UGL, UKET, UBIL, UNIT, UROB, ULEU, UEPI, UWBC, URBC, UBAC, CAST, [place], [place]  Basic Metabolic Panel: Recent Labs  Lab 06/04/23 1315 06/07/23 0909 06/08/23 0546  NA 130* 129* 132*  K 3.5 3.5 3.2*  CL 92* 91* 98  CO2 30 27 25   GLUCOSE 211* 143* 119*  BUN 22 17 15   CREATININE 1.29* 1.11 0.95  CALCIUM  8.7* 8.4* 7.9*   GFR Estimated Creatinine Clearance: 63.1 mL/min (by C-G formula based on SCr of 0.95 mg/dL). Liver Function Tests: Recent Labs  Lab 06/04/23 1315 06/07/23 0909 06/08/23 0546  AST 56* 49* 41  ALT 44 36 30  ALKPHOS 584* 456* 370*  BILITOT 1.3* 1.5* 1.3*  PROT 7.0 6.5 5.8*  ALBUMIN 3.6 2.8* 2.4*    Recent Labs  Lab 06/07/23 0909  LIPASE 38   No results for input(s): AMMONIA in the last 168 hours. Coagulation profile No results for input(s): INR, PROTIME in the last 168 hours.  CBC: Recent Labs  Lab 06/04/23 1315 06/07/23 0909 06/08/23 0546  WBC 5.3 6.4 5.4  NEUTROABS 3.8 4.9  --   HGB 13.5 13.5 12.0*  HCT 39.5 39.0 35.8*  MCV 87.4 86.9 87.7  PLT 130* 151 162   Cardiac Enzymes: No results for input(s): CKTOTAL, CKMB, CKMBINDEX, TROPONINI in the last 168 hours. BNP: Invalid input(s): POCBNP CBG: No results for input(s): GLUCAP in the last 168 hours. D-Dimer No results for input(s): DDIMER in the last 72 hours. Hgb A1c No results for input(s): HGBA1C in the last 72 hours. Lipid Profile No results for input(s): CHOL, HDL, LDLCALC, TRIG, CHOLHDL, LDLDIRECT in the last 72 hours. Thyroid  function studies No results for input(s): TSH, T4TOTAL, T3FREE, THYROIDAB in the last 72 hours.  Invalid input(s): FREET3 Anemia work up No results for input(s): VITAMINB12, FOLATE, FERRITIN, TIBC, IRON, RETICCTPCT in the last 72 hours. Microbiology Recent Results (from the past 240 hours)  Culture, blood (routine x 2)     Status: None (Preliminary result)   Collection Time: 06/07/23 11:58 AM   Specimen: BLOOD  Result Value Ref Range  Status   Specimen Description   Final    BLOOD LEFT ANTECUBITAL Performed at [facility], [address] [place], [place] [address]    Special Requests   Final    BOTTLES DRAWN AEROBIC AND ANAEROBIC Blood Culture adequate volume Performed at [facility], [address], [place], [place] [address]    Culture   Final    NO GROWTH < 24 HOURS Performed at [facility], [address] [place], [place] [address]    Report Status PENDING  Incomplete  Culture, blood (routine x 2)     Status: None (Preliminary result)   Collection Time: 06/07/23  11:58 AM   Specimen: BLOOD  Result Value Ref Range Status   Specimen Description   Final    BLOOD RIGHT ANTECUBITAL Performed at [facility], [address], [place], [place] [address]    Special Requests   Final    BOTTLES DRAWN AEROBIC AND ANAEROBIC Blood Culture adequate volume Performed at [facility], [address] [place], [place] [address]    Culture  Setup Time   Final    GRAM POSITIVE COCCI IN BOTH AEROBIC AND ANAEROBIC BOTTLES CRITICAL RESULT CALLED TO, READ BACK BY AND VERIFIED WITH: MAYA LACINDA BIRCH [id number] FCP Performed at [facility], [address] [place], [place] [address]    Culture GRAM POSITIVE COCCI  Final   Report Status PENDING  Incomplete  Blood Culture ID Panel (Reflexed)     Status: Abnormal   Collection Time: 06/07/23 11:58 AM  Result Value Ref Range Status   Enterococcus faecalis NOT DETECTED NOT DETECTED Final   Enterococcus Faecium NOT DETECTED NOT DETECTED Final   Listeria monocytogenes NOT DETECTED NOT DETECTED Final   Staphylococcus species DETECTED (A) NOT DETECTED Final    Comment: CRITICAL RESULT CALLED TO, READ BACK BY AND VERIFIED WITH: PHARMD SYDNEY, D [phone] [id number] FCP    Staphylococcus aureus (BCID) NOT DETECTED NOT DETECTED Final   Staphylococcus epidermidis NOT DETECTED NOT DETECTED Final   Staphylococcus lugdunensis NOT DETECTED NOT DETECTED Final   Streptococcus species NOT DETECTED NOT DETECTED Final   Streptococcus agalactiae NOT DETECTED NOT DETECTED Final   Streptococcus pneumoniae NOT DETECTED NOT DETECTED Final   Streptococcus pyogenes NOT DETECTED NOT DETECTED Final   A.calcoaceticus-baumannii NOT DETECTED NOT DETECTED Final   Bacteroides fragilis NOT DETECTED NOT DETECTED Final   Enterobacterales NOT DETECTED NOT DETECTED Final   Enterobacter cloacae complex NOT DETECTED NOT DETECTED Final   Escherichia coli NOT DETECTED NOT DETECTED Final   Klebsiella aerogenes NOT  DETECTED NOT DETECTED Final   Klebsiella oxytoca NOT DETECTED NOT DETECTED Final   Klebsiella pneumoniae NOT DETECTED NOT DETECTED Final   Proteus species NOT DETECTED NOT DETECTED Final   Salmonella species NOT DETECTED NOT DETECTED Final   Serratia marcescens NOT DETECTED NOT DETECTED Final   Haemophilus influenzae NOT DETECTED NOT DETECTED Final   Neisseria meningitidis NOT DETECTED NOT DETECTED Final   Pseudomonas aeruginosa NOT DETECTED NOT DETECTED Final   Stenotrophomonas maltophilia NOT DETECTED NOT DETECTED Final   Candida albicans NOT DETECTED NOT DETECTED Final   Candida auris NOT DETECTED NOT DETECTED Final   Candida glabrata NOT DETECTED NOT DETECTED Final   Candida krusei NOT DETECTED NOT DETECTED Final   Candida parapsilosis NOT DETECTED NOT DETECTED Final   Candida tropicalis NOT DETECTED NOT DETECTED Final   Cryptococcus neoformans/gattii NOT DETECTED NOT DETECTED Final  Comment: Performed at [facility], [address] [place], [place] [address]      Studies:  DG CHEST PORT 1 VIEW Result Date: 06/08/2023 CLINICAL DATA:  141880 SOB (shortness of breath) [id number] EXAM: PORTABLE CHEST 1 VIEW COMPARISON:  Chest x-ray 06/07/2023, PET CT 04/16/2023, CT chest 03/21/2023 FINDINGS: The heart and mediastinal contours are unchanged. Atherosclerotic plaque. Low lung volumes. Azygous fissure incidentally noted. Interval increase in patchy airspace opacities of the left lung. Coarsened interstitial markings with cystic changes. No pulmonary edema. No pleural effusion. No pneumothorax. No acute osseous abnormality. IMPRESSION: 1. Interval worsening multifocal left lung pneumonia. Low lung volumes with right lung involvement not fully excluded. Followup PA and lateral chest X-ray is recommended in 3-4 weeks following therapy to ensure resolution. 2. Aortic Atherosclerosis (ICD10-I70.0) and Emphysema (ICD10-J43.9). Electronically Signed   By: Morgane  Naveau M.D.   On: 06/08/2023  10:12   ECHOCARDIOGRAM COMPLETE Result Date: 06/07/2023    ECHOCARDIOGRAM REPORT   Patient Name:   Corey [name] [facility] Date of Exam: 06/07/2023 Medical Rec #:  [id number]             Height:       72.0 in Accession #:    [id number]            Weight:       193.6 lb Date of Birth:  [birth year]             BSA:          2.101 m Patient Age:    88 years              BP:           133/73 mmHg Patient Gender: M                     HR:           68 bpm. Exam Location:  Inpatient Procedure: 2D Echo, Cardiac Doppler and Color Doppler Indications:    CHF- Acute Systolic  History:        Patient has prior history of Echocardiogram examinations, most                 recent 07/28/2015. Risk Factors:Hypertension and Former Smoker.  Sonographer:    Ozell Free Referring Phys: [id number] MIR [facility] IMPRESSIONS  1. Left ventricular ejection fraction, by estimation, is 50 to 55%. Left ventricular ejection fraction by 2D MOD biplane is 50.5 %. The left ventricle has low normal function. The left ventricle has no regional wall motion abnormalities. Left ventricular diastolic parameters are consistent with Grade I diastolic dysfunction (impaired relaxation).  2. Right ventricular systolic function is mildly reduced. The right ventricular size is normal.  3. The mitral valve is grossly normal. Trivial mitral valve regurgitation.  4. The aortic valve is tricuspid. Aortic valve regurgitation is not visualized. Aortic valve sclerosis/calcification is present, without any evidence of aortic stenosis.  5. The inferior vena cava is normal in size with greater than 50% respiratory variability, suggesting right atrial pressure of 3 mmHg. Comparison(s): Prior images unable to be directly viewed, comparison made by report only. Changes from prior study are noted. 07/28/2015: LVEF 50-55%. FINDINGS  Left Ventricle: Left ventricular ejection fraction, by estimation, is 50 to 55%. Left ventricular ejection fraction by 2D MOD biplane is 50.5 %. The  left ventricle has low normal function. The left ventricle has no regional wall motion abnormalities. The left ventricular internal cavity size was  normal in size. There is no left ventricular hypertrophy. Left ventricular diastolic parameters are consistent with Grade I diastolic dysfunction (impaired relaxation). Indeterminate filling pressures. Right Ventricle: The right ventricular size is normal. No increase in right ventricular wall thickness. Right ventricular systolic function is mildly reduced. Left Atrium: Left atrial size was normal in size. Right Atrium: Right atrial size was normal in size. Pericardium: There is no evidence of pericardial effusion. Mitral Valve: The mitral valve is grossly normal. Trivial mitral valve regurgitation. Tricuspid Valve: The tricuspid valve is grossly normal. Tricuspid valve regurgitation is trivial. Aortic Valve: The aortic valve is tricuspid. Aortic valve regurgitation is not visualized. Aortic valve sclerosis/calcification is present, without any evidence of aortic stenosis. Aortic valve mean gradient measures 5.0 mmHg. Aortic valve peak gradient measures 8.4 mmHg. Aortic valve area, by VTI measures 1.91 cm. Pulmonic Valve: The pulmonic valve was normal in structure. Pulmonic valve regurgitation is not visualized. Aorta: The aortic root and ascending aorta are structurally normal, with no evidence of dilitation. Venous: The inferior vena cava is normal in size with greater than 50% respiratory variability, suggesting right atrial pressure of 3 mmHg. IAS/Shunts: No atrial level shunt detected by color flow Doppler.  LEFT VENTRICLE PLAX 2D                        Biplane EF (MOD) LVIDd:         5.30 cm         LV Biplane EF:   Left LVIDs:         3.70 cm                          ventricular LV PW:         1.00 cm                          ejection LV IVS:        0.80 cm                          fraction by LVOT diam:     2.00 cm                          2D MOD LV SV:          57                               biplane is LV SV Index:   27                               50.5 %. LVOT Area:     3.14 cm                                Diastology                                LV e' medial:    5.98 cm/s LV Volumes (MOD)               LV E/e' medial:  8.0 LV vol d, MOD  129.2 ml      LV e' lateral:   11.60 cm/s A2C:                           LV E/e' lateral: 4.1 LV vol d, MOD    125.8 ml A4C: LV vol s, MOD    64.1 ml A2C: LV vol s, MOD    58.0 ml A4C: LV SV MOD A2C:   65.2 ml LV SV MOD A4C:   125.8 ml LV SV MOD BP:    56.6 ml RIGHT VENTRICLE RV Basal diam:  3.40 cm RV S prime:     7.94 cm/s TAPSE (M-mode): 1.6 cm LEFT ATRIUM             Index        RIGHT ATRIUM           Index LA diam:        3.40 cm 1.62 cm/m   RA Area:     14.10 cm LA Vol (A2C):   67.0 ml 31.88 ml/m  RA Volume:   29.60 ml  14.09 ml/m LA Vol (A4C):   37.2 ml 17.70 ml/m LA Biplane Vol: 48.8 ml 23.22 ml/m  AORTIC VALVE AV Area (Vmax):    2.16 cm AV Area (Vmean):   1.90 cm AV Area (VTI):     1.91 cm AV Vmax:           145.00 cm/s AV Vmean:          102.000 cm/s AV VTI:            0.296 m AV Peak Grad:      8.4 mmHg AV Mean Grad:      5.0 mmHg LVOT Vmax:         99.90 cm/s LVOT Vmean:        61.600 cm/s LVOT VTI:          0.180 m LVOT/AV VTI ratio: 0.61  AORTA Ao Root diam: 3.40 cm MITRAL VALVE MV Area (PHT): 2.34 cm    SHUNTS MV Decel Time: 324 msec    Systemic VTI:  0.18 m MV E velocity: 47.60 cm/s  Systemic Diam: 2.00 cm MV A velocity: 74.10 cm/s MV E/A ratio:  0.64 Vinie Maxcy MD Electronically signed by Vinie Maxcy MD Signature Date/Time: 06/07/2023/5:20:08 PM    Final    DG Chest 2 View Result Date: 06/07/2023 CLINICAL DATA:  Cough. EXAM: CHEST - 2 VIEW COMPARISON:  06/04/2023. FINDINGS: Increasing heterogeneous opacity predominantly overlying the left mid lower lung zone, laterally, compatible with left lung lower lobe pneumonia. There also faint heterogeneous opacities overlying the right lung, laterally,  also concerning for developing pneumonia. Follow-up to clearing is recommended. Bilateral costophrenic angles are clear. No pneumothorax. Note is made of elevated right hemidiaphragm. Stable cardio-mediastinal silhouette. No acute osseous abnormalities. The soft tissues are within normal limits. IMPRESSION: *Multilobar pneumonia, as described above. Follow-up to clearing is recommended. Electronically Signed   By: Ree Molt M.D.   On: 06/07/2023 09:03    Assessment: 89 y.o. male   Multifocal community-acquired pneumonia  Hypoxic respiratory failure Stage IV neuroendocrine tumor, status post first-line everolimus  CHF HTN and AF    Plan:  -Patient is on azithromycin  and ceftriaxone  for community-acquired pneumonia based on the chest x-ray, he has required nasal oxygen, has not noticed much improvement since yesterday. -He has tried diuretics by his cardiologist, but overall feels better with Lasix  and  it has been stopped -If his dyspnea and hypoxia does not improve in the next few days, then we can ask pulmonary to see him.  CT chest without contrast may be helpful to rule out other etiology of hypoxia, such as interstitial lung disease. He was on everolimus  for about 3 weeks, which I stopped earlier this week.  It potentially can cause noninfectious pneumonitis.  If patient does not respond to IV antibiotics, I think it is reasonable to consider empiric steroid.  -I will f/u on Monday.    Onita Mattock, MD 06/08/2023  4:23 PM

## 2023-06-08 NOTE — Progress Notes (Signed)
   06/08/23 1500  TOC Brief Assessment  Insurance and Status Reviewed  Patient has primary care physician Yes  Home environment has been reviewed Home w/ spouse  Prior level of function: Independent  Prior/Current Home Services No current home services  Social Drivers of Health Review SDOH reviewed no interventions necessary  Readmission risk has been reviewed Yes  Transition of care needs transition of care needs identified, TOC will continue to follow   TOC to follow for O2 need.

## 2023-06-08 NOTE — Progress Notes (Signed)
 Mobility Specialist - Progress Note  ([place] 3L) Pre-mobility: 78 bpm HR, 92% SpO2 During mobility: 88 bpm HR, 85% SpO2 Post-mobility: 77 bpm HR, 89% SPO2   06/08/23 1346  Mobility  Activity Ambulated independently in hallway  Level of Assistance Standby assist, set-up cues, supervision of patient - no hands on  Assistive Device None  Distance Ambulated (ft) 100 ft  Range of Motion/Exercises Active  Activity Response Tolerated fair  Mobility Referral Yes  Mobility visit 1 Mobility  Mobility Specialist Start Time (ACUTE ONLY) 1335  Mobility Specialist Stop Time (ACUTE ONLY) 1346  Mobility Specialist Time Calculation (min) (ACUTE ONLY) 11 min   Pt was found in bed and agreeable to ambulate. C/o SOB with session. Took x1 brief standing rest break due to SPO2 decreasing to 85%. Able to increase to 90% within 1 min. At EOS returned to bed with all needs met. Call bell in reach.  Erminio Leos Mobility Specialist

## 2023-06-08 NOTE — Plan of Care (Signed)
   Problem: Education: Goal: Knowledge of General Education information will improve Description: Including pain rating scale, medication(s)/side effects and non-pharmacologic comfort measures Outcome: Progressing   Problem: Clinical Measurements: Goal: Ability to maintain clinical measurements within normal limits will improve Outcome: Progressing   Problem: Clinical Measurements: Goal: Will remain free from infection Outcome: Progressing   Problem: Clinical Measurements: Goal: Diagnostic test results will improve Outcome: Progressing   Problem: Activity: Goal: Risk for activity intolerance will decrease Outcome: Progressing

## 2023-06-08 NOTE — Progress Notes (Signed)
 PROGRESS NOTE    Corey [name]  FMW:[id number] DOB: 03-19-1935 DOA: 06/07/2023 PCP: Charlott Dorn LABOR, MD   Brief Narrative:  The patient is a 89 year old Caucasian male with a past medical history significant for but not limited to paroxysmal atrial fibrillation on anticoagulation with Eliquis , history of BPH, recently diagnosed neuroendocrine liver tumors with unknown primary being admitted to the hospital with weakness and shortness of breath.  Patient was noted to be hypoxic and had acute respiratory failure and most of the history is provided by his daughter and his wife and unfortunately the patient had been recently diagnosed with stage IV metastatic neuroendocrine tumor of unknown primary in November 2024.  Since that time he was started on Afinitor  but was unable to tolerate it due to significant fatigue and cough.  He was awaiting consultation with Dr. Malva for Lutathera Therapy and was supposed to have consultation with him yesterday but given his weakness and shortness of breath he came to the hospital.  He was recently seen by his oncologist on Monday and he had developed a cough which was dry and so the oncologist ordered a chest x-ray which revealed pneumonia.  He was then seen by his cardiologist on 06/05/2023 who felt that based on his presentation the patient may be a heart failure so he was started on Lasix .  He had a good response to Lasix  and his poor oral edema resolved however his cough and dyspnea continued.  Given this he presented to the ED and further workup showed evidence of progressive pulmonary consolidation.  He was initiated on IV antibiotics with empiric azithromycin  and Rocephin  and admitted and currently repeat chest x-ray done and showed Interval worsening multifocal left lung pneumonia. Low lung volumes with right lung involvement not fully excluded. Followup PA and lateral chest X-ray is recommended in 3-4 weeks following therapy to ensure resolution. Aortic  Atherosclerosis and Emphysema  His breathing treatments have been adjusted and we have added guaifenesin , flutter valve and incentive spirometry  Assessment and Plan:  Acute Respiratory Failure with Hypoxia in the setting of CAP -Presented with acute respiratory failure with hypoxia and had a reported pulse oximetry of 85% on room air at home -Most likely etiology of his hypoxia due to community-acquired pneumonia and PE was considered in the setting of his malignancy but is unlikely given that the patient is already anticoagulated Eliquis  and has no pleuritic chest pain and had no evidence of tachycardia on admission SpO2: 91 % O2 Flow Rate (L/min): 3 L/min -Continuous pulse oximetry and maintain O2 saturations greater than 90% -Will add incentive spirometry, guaifenesin  1200 mg p.o. twice daily and flutter valve -Also added Xopenex /Atrovent  every 6 scheduled -Supplemental oxygen via nasal cannula wean O2 as tolerated -Will need an ambulatory home O2 screen and repeat chest x-ray in a.m.  ? Bacteremia -1/4 showed GPC and likely a contaminant -Repeat Blood Cx x2 today -C/w Abx as above  Community-Acquired Pneumonia -Has had progressive cough, hypoxia, multilobar pneumonia seen on chest x-ray and has been progressive when compared to x-ray from 06/04/2023.  No fever, leukocytosis, or other evidence of sepsis. -Observation admission but will change to Inpatient  -Supplemental oxygen if needed -C/w Empiric IV azithromycin  and IV Rocephin  -Incentive spirometer, and flutter valve and Guaifenesin  -Treatment as above -Repeat CXR this AM done and showed Interval worsening multifocal left lung pneumonia. Low lung volumes with right lung involvement not fully excluded. Followup PA and lateral chest X-ray is recommended in 3-4 weeks following therapy  to ensure resolution. Aortic atherosclerosis and Emphysema.   Chronic Diastolic CHF -he responded well to several days of p.o. Lasix , currently  has no evidence of fluid overload. -Hold off on further diuresis since euvolemic, and has some hyponatremia -Strict I's an O's and Daily Weights  Intake/Output Summary (Last 24 hours) at 06/08/2023 1711 Last data filed at 06/07/2023 1823 Gross per 24 hour  Intake 120 ml  Output --  Net 120 ml  -ECHOCardiogram done and showed  -Plan outpatient cardiology follow-up   Stage IV Neuroendocrine Tumor -Notified Dr. Lanny as a courtesy  -Will need outpatient follow-up with Dr. Lanny and Dr. Malva as previously planned   Paroxysmal Atrial Fibrillation -Continue to Monitor on Telemetry  -Continue Tambocor , Eliquis  anticoagulation   Hypertension -Continue home ARB with Irbesartan  300 mg po Daily -Continue to Monitor BP per Protocol -Last BP Reading was 127/57   Depression and Anxiety -C/w Buproprion 150 mg po Daily   Normocytic Anemia -Hgb/Hct Trend: Recent Labs  Lab 06/04/23 1315 06/07/23 0909 06/08/23 0546  HGB 13.5 13.5 12.0*  HCT 39.5 39.0 35.8*  -Check Anemia Panel in the AM -Continue to Monitor for S/Sx of Bleeding; No overt bleeding noted -Repeat CBC in the AM  Hyponatremia -In the setting of Diuretics and suspected to be hypovolemic hyponatremia due to his recent reduced oral intake as well as diuretic usage -Currently holding further diuresis as well as his home HCTZ and he was given a 1 L IV fluid bolus in the ED and will hold off further fluids -Na+ Trend: Recent Labs  Lab 05/11/23 1042 06/04/23 1315 06/07/23 0909 06/08/23 0546  NA 136 130* 129* 132*  -Continue to Monitor and Trend and repeat CMP in the AM   Hypokalemia -Patient's K+ Level Trend: Recent Labs  Lab 05/11/23 1042 06/04/23 1315 06/07/23 0909 06/08/23 0546  K 5.4* 3.5 3.5 3.2*  -Replete with po KCL 40 mEQ BID x2 -Continue to Monitor and Replete as Necessary -Repeat CMP in the AM   HLD -C/w Atorvastatin  20 mg p.o. daily  Hyperbilirubinemia -Mild and Likely Reactive -Bilirubin  Trend: Recent Labs  Lab 05/11/23 1042 06/04/23 1315 06/07/23 0909 06/08/23 0546  BILITOT 1.4* 1.3* 1.5* 1.3*  -Continue to Monitor and Trend and repeat CMP in the AM   GERD/GI Prophylaxis -C/w PPI with Pantoprazole  40 mg Daily   Hypoalbuminemia -Patient's Albumin Trend: Recent Labs  Lab 05/11/23 1042 06/04/23 1315 06/07/23 0909 06/08/23 0546  ALBUMIN 3.6 3.6 2.8* 2.4*  -Continue to Monitor and Trend and repeat CMP in the AM   DVT prophylaxis: SCDs Start: 06/07/23 1231 apixaban  (ELIQUIS ) tablet 5 mg    Code Status: Full Code Family Communication: Discussed with wife and Daughter at bedside   Disposition Plan:  Level of care: Telemetry Status is: Inpatient Remains inpatient appropriate because: Needs further clinical improvement and   Consultants:  Medical Oncology  Procedures:  ECHOCARDIOGRAM IMPRESSIONS     1. Left ventricular ejection fraction, by estimation, is 50 to 55%. Left  ventricular ejection fraction by 2D MOD biplane is 50.5 %. The left  ventricle has low normal function. The left ventricle has no regional wall  motion abnormalities. Left  ventricular diastolic parameters are consistent with Grade I diastolic  dysfunction (impaired relaxation).   2. Right ventricular systolic function is mildly reduced. The right  ventricular size is normal.   3. The mitral valve is grossly normal. Trivial mitral valve  regurgitation.   4. The aortic valve is tricuspid. Aortic valve  regurgitation is not  visualized. Aortic valve sclerosis/calcification is present, without any  evidence of aortic stenosis.   5. The inferior vena cava is normal in size with greater than 50%  respiratory variability, suggesting right atrial pressure of 3 mmHg.   Comparison(s): Prior images unable to be directly viewed, comparison made  by report only. Changes from prior study are noted. 07/28/2015: LVEF 50-55%.   FINDINGS   Left Ventricle: Left ventricular ejection fraction, by  estimation, is 50  to 55%. Left ventricular ejection fraction by 2D MOD biplane is 50.5 %.  The left ventricle has low normal function. The left ventricle has no  regional wall motion abnormalities.  The left ventricular internal cavity size was normal in size. There is no  left ventricular hypertrophy. Left ventricular diastolic parameters are  consistent with Grade I diastolic dysfunction (impaired relaxation).  Indeterminate filling pressures.   Right Ventricle: The right ventricular size is normal. No increase in  right ventricular wall thickness. Right ventricular systolic function is  mildly reduced.   Left Atrium: Left atrial size was normal in size.   Right Atrium: Right atrial size was normal in size.   Pericardium: There is no evidence of pericardial effusion.   Mitral Valve: The mitral valve is grossly normal. Trivial mitral valve  regurgitation.   Tricuspid Valve: The tricuspid valve is grossly normal. Tricuspid valve  regurgitation is trivial.   Aortic Valve: The aortic valve is tricuspid. Aortic valve regurgitation is  not visualized. Aortic valve sclerosis/calcification is present, without  any evidence of aortic stenosis. Aortic valve mean gradient measures 5.0  mmHg. Aortic valve peak gradient  measures 8.4 mmHg. Aortic valve area, by VTI measures 1.91 cm.   Pulmonic Valve: The pulmonic valve was normal in structure. Pulmonic valve  regurgitation is not visualized.   Aorta: The aortic root and ascending aorta are structurally normal, with  no evidence of dilitation.   Venous: The inferior vena cava is normal in size with greater than 50%  respiratory variability, suggesting right atrial pressure of 3 mmHg.   IAS/Shunts: No atrial level shunt detected by color flow Doppler.     LEFT VENTRICLE  PLAX 2D                        Biplane EF (MOD)  LVIDd:         5.30 cm         LV Biplane EF:   Left  LVIDs:         3.70 cm                          ventricular   LV PW:         1.00 cm                          ejection  LV IVS:        0.80 cm                          fraction by  LVOT diam:     2.00 cm                          2D MOD  LV SV:         57  biplane is  LV SV Index:   27                               50.5 %.  LVOT Area:     3.14 cm                                 Diastology                                 LV e' medial:    5.98 cm/s  LV Volumes (MOD)               LV E/e' medial:  8.0  LV vol d, MOD    129.2 ml      LV e' lateral:   11.60 cm/s  A2C:                           LV E/e' lateral: 4.1  LV vol d, MOD    125.8 ml  A4C:  LV vol s, MOD    64.1 ml  A2C:  LV vol s, MOD    58.0 ml  A4C:  LV SV MOD A2C:   65.2 ml  LV SV MOD A4C:   125.8 ml  LV SV MOD BP:    56.6 ml   RIGHT VENTRICLE  RV Basal diam:  3.40 cm  RV S prime:     7.94 cm/s  TAPSE (M-mode): 1.6 cm   LEFT ATRIUM             Index        RIGHT ATRIUM           Index  LA diam:        3.40 cm 1.62 cm/m   RA Area:     14.10 cm  LA Vol (A2C):   67.0 ml 31.88 ml/m  RA Volume:   29.60 ml  14.09 ml/m  LA Vol (A4C):   37.2 ml 17.70 ml/m  LA Biplane Vol: 48.8 ml 23.22 ml/m   AORTIC VALVE  AV Area (Vmax):    2.16 cm  AV Area (Vmean):   1.90 cm  AV Area (VTI):     1.91 cm  AV Vmax:           145.00 cm/s  AV Vmean:          102.000 cm/s  AV VTI:            0.296 m  AV Peak Grad:      8.4 mmHg  AV Mean Grad:      5.0 mmHg  LVOT Vmax:         99.90 cm/s  LVOT Vmean:        61.600 cm/s  LVOT VTI:          0.180 m  LVOT/AV VTI ratio: 0.61    AORTA  Ao Root diam: 3.40 cm   MITRAL VALVE  MV Area (PHT): 2.34 cm    SHUNTS  MV Decel Time: 324 msec    Systemic VTI:  0.18 m  MV E velocity: 47.60 cm/s  Systemic Diam: 2.00 cm  MV A velocity: 74.10 cm/s  MV E/A ratio:  0.64   Antimicrobials:  Anti-infectives (From admission, onward)  Start     Dose/Rate Route Frequency Ordered Stop   06/08/23 1200  cefTRIAXone  (ROCEPHIN ) 2 g in  sodium chloride  0.9 % 100 mL IVPB        2 g 200 mL/hr over 30 Minutes Intravenous Every 24 hours 06/07/23 1232     06/08/23 1100  azithromycin  (ZITHROMAX ) 500 mg in sodium chloride  0.9 % 250 mL IVPB        500 mg 250 mL/hr over 60 Minutes Intravenous Every 24 hours 06/07/23 1232     06/07/23 1115  cefTRIAXone  (ROCEPHIN ) 1 g in sodium chloride  0.9 % 100 mL IVPB        1 g 200 mL/hr over 30 Minutes Intravenous  Once 06/07/23 1103 06/07/23 1353   06/07/23 1115  azithromycin  (ZITHROMAX ) 500 mg in sodium chloride  0.9 % 250 mL IVPB        500 mg 250 mL/hr over 60 Minutes Intravenous  Once 06/07/23 1103 06/07/23 1522       Subjective: Seen and examined at bedside and has a dry cough. No CP but remains SOB. Feels weak. No lightheadedness or dizziness. No other concerns or complaints at this time.   Objective: Vitals:   06/07/23 2009 06/08/23 0044 06/08/23 0449 06/08/23 1235  BP: (!) 113/58 (!) 124/58 124/60 (!) 127/57  Pulse: 71 72 78 73  Resp: 20 18 16 20   Temp: 98.5 F (36.9 C) 98.8 F (37.1 C) 99.1 F (37.3 C) 98.2 F (36.8 C)  TempSrc:      SpO2: 94% 96% 91% 91%  Weight:      Height:        Intake/Output Summary (Last 24 hours) at 06/08/2023 1716 Last data filed at 06/07/2023 1823 Gross per 24 hour  Intake 120 ml  Output --  Net 120 ml   Filed Weights   06/07/23 1628  Weight: 86.7 kg   Examination: Physical Exam:  Constitutional: Elderly overweight chronically ill-appearing Caucasian male who appears a little short of breath Respiratory: Diminished to auscultation bilaterally with coarse breath sounds and has some slight rhonchi but no appreciable rales or crackles.  Wearing supplemental oxygen via nasal cannula Cardiovascular: RRR, no murmurs / rubs / gallops. S1 and S2 auscultated. No extremity edema.  Abdomen: Soft, non-tender, non-distended. Bowel sounds positive.  GU: Deferred. Musculoskeletal: No clubbing / cyanosis of digits/nails. No joint deformity upper and  lower extremities.  Skin: No rashes, lesions, ulcers on limited skin evaluation. No induration; Warm and dry.  Neurologic: CN 2-12 grossly intact with no focal deficits. Romberg sign and cerebellar reflexes not assessed.  Psychiatric: Normal judgment and insight. Alert and oriented x 3. Normal mood and appropriate affect.   Data Reviewed: I have personally reviewed following labs and imaging studies  CBC: Recent Labs  Lab 06/04/23 1315 06/07/23 0909 06/08/23 0546  WBC 5.3 6.4 5.4  NEUTROABS 3.8 4.9  --   HGB 13.5 13.5 12.0*  HCT 39.5 39.0 35.8*  MCV 87.4 86.9 87.7  PLT 130* 151 162   Basic Metabolic Panel: Recent Labs  Lab 06/04/23 1315 06/07/23 0909 06/08/23 0546  NA 130* 129* 132*  K 3.5 3.5 3.2*  CL 92* 91* 98  CO2 30 27 25   GLUCOSE 211* 143* 119*  BUN 22 17 15   CREATININE 1.29* 1.11 0.95  CALCIUM  8.7* 8.4* 7.9*   GFR: Estimated Creatinine Clearance: 63.1 mL/min (by C-G formula based on SCr of 0.95 mg/dL). Liver Function Tests: Recent Labs  Lab 06/04/23 1315 06/07/23 0909 06/08/23 0546  AST 56* 49* 41  ALT 44 36 30  ALKPHOS 584* 456* 370*  BILITOT 1.3* 1.5* 1.3*  PROT 7.0 6.5 5.8*  ALBUMIN 3.6 2.8* 2.4*   Recent Labs  Lab 06/07/23 0909  LIPASE 38   No results for input(s): AMMONIA in the last 168 hours. Coagulation Profile: No results for input(s): INR, PROTIME in the last 168 hours. Cardiac Enzymes: No results for input(s): CKTOTAL, CKMB, CKMBINDEX, TROPONINI in the last 168 hours. BNP (last 3 results) No results for input(s): PROBNP in the last 8760 hours. HbA1C: No results for input(s): HGBA1C in the last 72 hours. CBG: No results for input(s): GLUCAP in the last 168 hours. Lipid Profile: No results for input(s): CHOL, HDL, LDLCALC, TRIG, CHOLHDL, LDLDIRECT in the last 72 hours. Thyroid  Function Tests: No results for input(s): TSH, T4TOTAL, FREET4, T3FREE, THYROIDAB in the last 72 hours. Anemia  Panel: No results for input(s): VITAMINB12, FOLATE, FERRITIN, TIBC, IRON, RETICCTPCT in the last 72 hours. Sepsis Labs: No results for input(s): PROCALCITON, LATICACIDVEN in the last 168 hours.  Recent Results (from the past 240 hours)  Culture, blood (routine x 2)     Status: None (Preliminary result)   Collection Time: 06/07/23 11:58 AM   Specimen: BLOOD  Result Value Ref Range Status   Specimen Description   Final    BLOOD LEFT ANTECUBITAL Performed at [facility], [address] [place], [place] [address]    Special Requests   Final    BOTTLES DRAWN AEROBIC AND ANAEROBIC Blood Culture adequate volume Performed at [facility], [address] [place], [place] [address]    Culture   Final    NO GROWTH < 24 HOURS Performed at [facility], [address], [place], [place] [address]    Report Status PENDING  Incomplete  Culture, blood (routine x 2)     Status: None (Preliminary result)   Collection Time: 06/07/23 11:58 AM   Specimen: BLOOD  Result Value Ref Range Status   Specimen Description   Final    BLOOD RIGHT ANTECUBITAL Performed at [facility], [address], [place], [place] [address]    Special Requests   Final    BOTTLES DRAWN AEROBIC AND ANAEROBIC Blood Culture adequate volume Performed at [facility], [address], [place], [place] [address]    Culture  Setup Time   Final    GRAM POSITIVE COCCI IN BOTH AEROBIC AND ANAEROBIC BOTTLES CRITICAL RESULT CALLED TO, READ BACK BY AND VERIFIED WITH: MAYA LACINDA BIRCH [id number] FCP Performed at [facility], [address], [place], [place] [address]    Culture GRAM POSITIVE COCCI  Final   Report Status PENDING  Incomplete  Blood Culture ID Panel (Reflexed)     Status: Abnormal   Collection Time: 06/07/23 11:58 AM  Result Value Ref Range Status   Enterococcus faecalis NOT DETECTED NOT DETECTED Final    Enterococcus Faecium NOT DETECTED NOT DETECTED Final   Listeria monocytogenes NOT DETECTED NOT DETECTED Final   Staphylococcus species DETECTED (A) NOT DETECTED Final    Comment: CRITICAL RESULT CALLED TO, READ BACK BY AND VERIFIED WITH: PHARMD SYDNEY, D [phone] [id number] FCP    Staphylococcus aureus (BCID) NOT DETECTED NOT DETECTED Final   Staphylococcus epidermidis NOT DETECTED NOT DETECTED Final   Staphylococcus lugdunensis NOT DETECTED NOT DETECTED Final   Streptococcus species NOT DETECTED NOT DETECTED Final   Streptococcus agalactiae NOT DETECTED NOT DETECTED  Final   Streptococcus pneumoniae NOT DETECTED NOT DETECTED Final   Streptococcus pyogenes NOT DETECTED NOT DETECTED Final   A.calcoaceticus-baumannii NOT DETECTED NOT DETECTED Final   Bacteroides fragilis NOT DETECTED NOT DETECTED Final   Enterobacterales NOT DETECTED NOT DETECTED Final   Enterobacter cloacae complex NOT DETECTED NOT DETECTED Final   Escherichia coli NOT DETECTED NOT DETECTED Final   Klebsiella aerogenes NOT DETECTED NOT DETECTED Final   Klebsiella oxytoca NOT DETECTED NOT DETECTED Final   Klebsiella pneumoniae NOT DETECTED NOT DETECTED Final   Proteus species NOT DETECTED NOT DETECTED Final   Salmonella species NOT DETECTED NOT DETECTED Final   Serratia marcescens NOT DETECTED NOT DETECTED Final   Haemophilus influenzae NOT DETECTED NOT DETECTED Final   Neisseria meningitidis NOT DETECTED NOT DETECTED Final   Pseudomonas aeruginosa NOT DETECTED NOT DETECTED Final   Stenotrophomonas maltophilia NOT DETECTED NOT DETECTED Final   Candida albicans NOT DETECTED NOT DETECTED Final   Candida auris NOT DETECTED NOT DETECTED Final   Candida glabrata NOT DETECTED NOT DETECTED Final   Candida krusei NOT DETECTED NOT DETECTED Final   Candida parapsilosis NOT DETECTED NOT DETECTED Final   Candida tropicalis NOT DETECTED NOT DETECTED Final   Cryptococcus neoformans/gattii NOT DETECTED NOT DETECTED Final    Comment:  Performed at [facility], [address] [place], [place] [address]    Radiology Studies: DG CHEST PORT 1 VIEW Result Date: 06/08/2023 CLINICAL DATA:  141880 SOB (shortness of breath) [id number] EXAM: PORTABLE CHEST 1 VIEW COMPARISON:  Chest x-ray 06/07/2023, PET CT 04/16/2023, CT chest 03/21/2023 FINDINGS: The heart and mediastinal contours are unchanged. Atherosclerotic plaque. Low lung volumes. Azygous fissure incidentally noted. Interval increase in patchy airspace opacities of the left lung. Coarsened interstitial markings with cystic changes. No pulmonary edema. No pleural effusion. No pneumothorax. No acute osseous abnormality. IMPRESSION: 1. Interval worsening multifocal left lung pneumonia. Low lung volumes with right lung involvement not fully excluded. Followup PA and lateral chest X-ray is recommended in 3-4 weeks following therapy to ensure resolution. 2. Aortic Atherosclerosis (ICD10-I70.0) and Emphysema (ICD10-J43.9). Electronically Signed   By: Morgane  Naveau M.D.   On: 06/08/2023 10:12   ECHOCARDIOGRAM COMPLETE Result Date: 06/07/2023    ECHOCARDIOGRAM REPORT   Patient Name:   Corey [name] [facility] Date of Exam: 06/07/2023 Medical Rec #:  [id number]             Height:       72.0 in Accession #:    [id number]            Weight:       193.6 lb Date of Birth:  1934-10-03             BSA:          2.101 m Patient Age:    88 years              BP:           133/73 mmHg Patient Gender: M                     HR:           68 bpm. Exam Location:  Inpatient Procedure: 2D Echo, Cardiac Doppler and Color Doppler Indications:    CHF- Acute Systolic  History:        Patient has prior history of Echocardiogram examinations, most                 recent  07/28/2015. Risk Factors:Hypertension and Former Smoker.  Sonographer:    Ozell Free Referring Phys: [id number] MIR M [facility] IMPRESSIONS  1. Left ventricular ejection fraction, by estimation, is 50 to 55%. Left ventricular ejection fraction by 2D MOD  biplane is 50.5 %. The left ventricle has low normal function. The left ventricle has no regional wall motion abnormalities. Left ventricular diastolic parameters are consistent with Grade I diastolic dysfunction (impaired relaxation).  2. Right ventricular systolic function is mildly reduced. The right ventricular size is normal.  3. The mitral valve is grossly normal. Trivial mitral valve regurgitation.  4. The aortic valve is tricuspid. Aortic valve regurgitation is not visualized. Aortic valve sclerosis/calcification is present, without any evidence of aortic stenosis.  5. The inferior vena cava is normal in size with greater than 50% respiratory variability, suggesting right atrial pressure of 3 mmHg. Comparison(s): Prior images unable to be directly viewed, comparison made by report only. Changes from prior study are noted. 07/28/2015: LVEF 50-55%. FINDINGS  Left Ventricle: Left ventricular ejection fraction, by estimation, is 50 to 55%. Left ventricular ejection fraction by 2D MOD biplane is 50.5 %. The left ventricle has low normal function. The left ventricle has no regional wall motion abnormalities. The left ventricular internal cavity size was normal in size. There is no left ventricular hypertrophy. Left ventricular diastolic parameters are consistent with Grade I diastolic dysfunction (impaired relaxation). Indeterminate filling pressures. Right Ventricle: The right ventricular size is normal. No increase in right ventricular wall thickness. Right ventricular systolic function is mildly reduced. Left Atrium: Left atrial size was normal in size. Right Atrium: Right atrial size was normal in size. Pericardium: There is no evidence of pericardial effusion. Mitral Valve: The mitral valve is grossly normal. Trivial mitral valve regurgitation. Tricuspid Valve: The tricuspid valve is grossly normal. Tricuspid valve regurgitation is trivial. Aortic Valve: The aortic valve is tricuspid. Aortic valve regurgitation  is not visualized. Aortic valve sclerosis/calcification is present, without any evidence of aortic stenosis. Aortic valve mean gradient measures 5.0 mmHg. Aortic valve peak gradient measures 8.4 mmHg. Aortic valve area, by VTI measures 1.91 cm. Pulmonic Valve: The pulmonic valve was normal in structure. Pulmonic valve regurgitation is not visualized. Aorta: The aortic root and ascending aorta are structurally normal, with no evidence of dilitation. Venous: The inferior vena cava is normal in size with greater than 50% respiratory variability, suggesting right atrial pressure of 3 mmHg. IAS/Shunts: No atrial level shunt detected by color flow Doppler.  LEFT VENTRICLE PLAX 2D                        Biplane EF (MOD) LVIDd:         5.30 cm         LV Biplane EF:   Left LVIDs:         3.70 cm                          ventricular LV PW:         1.00 cm                          ejection LV IVS:        0.80 cm                          fraction by LVOT diam:     2.00  cm                          2D MOD LV SV:         57                               biplane is LV SV Index:   27                               50.5 %. LVOT Area:     3.14 cm                                Diastology                                LV e' medial:    5.98 cm/s LV Volumes (MOD)               LV E/e' medial:  8.0 LV vol d, MOD    129.2 ml      LV e' lateral:   11.60 cm/s A2C:                           LV E/e' lateral: 4.1 LV vol d, MOD    125.8 ml A4C: LV vol s, MOD    64.1 ml A2C: LV vol s, MOD    58.0 ml A4C: LV SV MOD A2C:   65.2 ml LV SV MOD A4C:   125.8 ml LV SV MOD BP:    56.6 ml RIGHT VENTRICLE RV Basal diam:  3.40 cm RV S prime:     7.94 cm/s TAPSE (M-mode): 1.6 cm LEFT ATRIUM             Index        RIGHT ATRIUM           Index LA diam:        3.40 cm 1.62 cm/m   RA Area:     14.10 cm LA Vol (A2C):   67.0 ml 31.88 ml/m  RA Volume:   29.60 ml  14.09 ml/m LA Vol (A4C):   37.2 ml 17.70 ml/m LA Biplane Vol: 48.8 ml 23.22 ml/m  AORTIC  VALVE AV Area (Vmax):    2.16 cm AV Area (Vmean):   1.90 cm AV Area (VTI):     1.91 cm AV Vmax:           145.00 cm/s AV Vmean:          102.000 cm/s AV VTI:            0.296 m AV Peak Grad:      8.4 mmHg AV Mean Grad:      5.0 mmHg LVOT Vmax:         99.90 cm/s LVOT Vmean:        61.600 cm/s LVOT VTI:          0.180 m LVOT/AV VTI ratio: 0.61  AORTA Ao Root diam: 3.40 cm MITRAL VALVE MV Area (PHT): 2.34 cm    SHUNTS MV Decel Time: 324 msec    Systemic VTI:  0.18 m MV E velocity: 47.60 cm/s  Systemic Diam:  2.00 cm MV A velocity: 74.10 cm/s MV E/A ratio:  0.64 Vinie Maxcy MD Electronically signed by Vinie Maxcy MD Signature Date/Time: 06/07/2023/5:20:08 PM    Final    DG Chest 2 View Result Date: 06/07/2023 CLINICAL DATA:  Cough. EXAM: CHEST - 2 VIEW COMPARISON:  06/04/2023. FINDINGS: Increasing heterogeneous opacity predominantly overlying the left mid lower lung zone, laterally, compatible with left lung lower lobe pneumonia. There also faint heterogeneous opacities overlying the right lung, laterally, also concerning for developing pneumonia. Follow-up to clearing is recommended. Bilateral costophrenic angles are clear. No pneumothorax. Note is made of elevated right hemidiaphragm. Stable cardio-mediastinal silhouette. No acute osseous abnormalities. The soft tissues are within normal limits. IMPRESSION: *Multilobar pneumonia, as described above. Follow-up to clearing is recommended. Electronically Signed   By: Ree Molt M.D.   On: 06/07/2023 09:03   Scheduled Meds:  apixaban   5 mg Oral BID   atorvastatin   20 mg Oral Daily   buPROPion   150 mg Oral q morning   busPIRone   5 mg Oral QHS   feeding supplement  237 mL Oral BID BM   flecainide   100 mg Oral BID   guaiFENesin   1,200 mg Oral BID   ipratropium  0.5 mg Nebulization Q6H   irbesartan   300 mg Oral Daily   levalbuterol   0.63 mg Nebulization Q6H   mirtazapine   15 mg Oral QHS   pantoprazole   40 mg Oral Daily   potassium chloride   40 mEq  Oral BID   Continuous Infusions:  azithromycin  500 mg (06/08/23 1149)   cefTRIAXone  (ROCEPHIN )  IV 2 g (06/08/23 1356)    LOS: 0 days   Alejandro Marker, DO [facility] Available via Epic secure chat 7am-7pm After these hours, please refer to coverage provider listed on [email or web address] 06/08/2023, 5:16 PM

## 2023-06-08 NOTE — Plan of Care (Signed)

## 2023-06-08 NOTE — Hospital Course (Addendum)
 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  He was recently started on Afinitor  but was unable to tolerate it due to significant fatigue and cough.  He was awaiting consultation with Dr. Malva for Lutathera therapy but missed his appointment because of this hospitalization.  He saw his oncologist on 1/6 and CXR showed pneumonia.  He was then seen by his cardiologist on 1/7 and there was concern for heart failure so he was started on Lasix .  He had a good response to Lasix  and his peripheral edema resolved however his cough and dyspnea continued.  Given this he presented to the ED and further workup showed evidence of progressive pulmonary consolidation.  He was started on azithromycin  and Rocephin  and repeat CXR showed Interval worsening multifocal left lung pneumonia. Chest CT showed no evidence of PE but did show possible pulmonary edema.  He was given IV Solu-Medrol  and a dose of IV Lasix  and cardiology has been consulted.  Pulmonary added IV steroids 125 mg every 12 for 5 more doses.  Respiratory status worsened on 1/14 and he was transferred to the stepdown unit and placed on heated high flow nasal cannula 60 L.  Antibiotics escalated to cefepime  and pulmonary added Bactrim .  Despite aggressive interventions, he continued to worsen from a respiratory standpoint. He was unable to tolerate and unable to wean off HFNC O2.  Eventually, in the face of continued decline, family agreed to transition to comfort measures and he died peacefully within a few hours.

## 2023-06-08 NOTE — Progress Notes (Signed)
 PHARMACY - PHYSICIAN COMMUNICATION CRITICAL VALUE ALERT - BLOOD CULTURE IDENTIFICATION (BCID)  Corey [name] is an 89 y.o. male who presented to [facility] on 06/07/2023 with a chief complaint of CAP.  Assessment: 1/4 bottles growing GPC in clusters; BCID showing staph species  Name of physician (or Provider) Contacted: Dr. Sherrill  Current antibiotics: ceftriaxone  and azithromycin   Changes to prescribed antibiotics recommended:  Likely a contaminant - continue current abx at this time for CAP coverage   Results for orders placed or performed during the hospital encounter of 06/07/23  Blood Culture ID Panel (Reflexed) (Collected: 06/07/2023 11:58 AM)  Result Value Ref Range   Enterococcus faecalis NOT DETECTED NOT DETECTED   Enterococcus Faecium NOT DETECTED NOT DETECTED   Listeria monocytogenes NOT DETECTED NOT DETECTED   Staphylococcus species DETECTED (A) NOT DETECTED   Staphylococcus aureus (BCID) NOT DETECTED NOT DETECTED   Staphylococcus epidermidis NOT DETECTED NOT DETECTED   Staphylococcus lugdunensis NOT DETECTED NOT DETECTED   Streptococcus species NOT DETECTED NOT DETECTED   Streptococcus agalactiae NOT DETECTED NOT DETECTED   Streptococcus pneumoniae NOT DETECTED NOT DETECTED   Streptococcus pyogenes NOT DETECTED NOT DETECTED   A.calcoaceticus-baumannii NOT DETECTED NOT DETECTED   Bacteroides fragilis NOT DETECTED NOT DETECTED   Enterobacterales NOT DETECTED NOT DETECTED   Enterobacter cloacae complex NOT DETECTED NOT DETECTED   Escherichia coli NOT DETECTED NOT DETECTED   Klebsiella aerogenes NOT DETECTED NOT DETECTED   Klebsiella oxytoca NOT DETECTED NOT DETECTED   Klebsiella pneumoniae NOT DETECTED NOT DETECTED   Proteus species NOT DETECTED NOT DETECTED   Salmonella species NOT DETECTED NOT DETECTED   Serratia marcescens NOT DETECTED NOT DETECTED   Haemophilus influenzae NOT DETECTED NOT DETECTED   Neisseria meningitidis NOT DETECTED NOT DETECTED    Pseudomonas aeruginosa NOT DETECTED NOT DETECTED   Stenotrophomonas maltophilia NOT DETECTED NOT DETECTED   Candida albicans NOT DETECTED NOT DETECTED   Candida auris NOT DETECTED NOT DETECTED   Candida glabrata NOT DETECTED NOT DETECTED   Candida krusei NOT DETECTED NOT DETECTED   Candida parapsilosis NOT DETECTED NOT DETECTED   Candida tropicalis NOT DETECTED NOT DETECTED   Cryptococcus neoformans/gattii NOT DETECTED NOT DETECTED    Lacinda Moats, PharmD Clinical Pharmacist  1/10/20257:58 AM

## 2023-06-09 ENCOUNTER — Inpatient Hospital Stay (HOSPITAL_COMMUNITY): Payer: Medicare Other

## 2023-06-09 DIAGNOSIS — J189 Pneumonia, unspecified organism: Secondary | ICD-10-CM | POA: Diagnosis not present

## 2023-06-09 DIAGNOSIS — J9601 Acute respiratory failure with hypoxia: Secondary | ICD-10-CM | POA: Diagnosis not present

## 2023-06-09 LAB — COMPREHENSIVE METABOLIC PANEL
ALT: 32 U/L (ref 0–44)
AST: 47 U/L — ABNORMAL HIGH (ref 15–41)
Albumin: 2.5 g/dL — ABNORMAL LOW (ref 3.5–5.0)
Alkaline Phosphatase: 383 U/L — ABNORMAL HIGH (ref 38–126)
Anion gap: 11 (ref 5–15)
BUN: 17 mg/dL (ref 8–23)
CO2: 24 mmol/L (ref 22–32)
Calcium: 8.4 mg/dL — ABNORMAL LOW (ref 8.9–10.3)
Chloride: 99 mmol/L (ref 98–111)
Creatinine, Ser: 0.94 mg/dL (ref 0.61–1.24)
GFR, Estimated: >60 mL/min (ref >60)
Glucose, Bld: 193 mg/dL — ABNORMAL HIGH (ref 70–99)
Potassium: 3.8 mmol/L (ref 3.5–5.1)
Sodium: 134 mmol/L — ABNORMAL LOW (ref 135–145)
Total Bilirubin: 1 mg/dL (ref 0.0–1.2)
Total Protein: 6.1 g/dL — ABNORMAL LOW (ref 6.5–8.1)

## 2023-06-09 LAB — RESPIRATORY PANEL BY PCR

## 2023-06-09 LAB — CBC WITH DIFFERENTIAL/PLATELET
Abs Immature Granulocytes: 0.03 10*3/uL (ref 0.00–0.07)
Basophils Absolute: 0.1 10*3/uL (ref 0.0–0.1)
Basophils Relative: 1 %
Eosinophils Absolute: 0.1 10*3/uL (ref 0.0–0.5)
Eosinophils Relative: 1 %
HCT: 38.1 % — ABNORMAL LOW (ref 39.0–52.0)
Hemoglobin: 12.3 g/dL — ABNORMAL LOW (ref 13.0–17.0)
Immature Granulocytes: 0 %
Lymphocytes Relative: 10 %
Lymphs Abs: 0.7 10*3/uL (ref 0.7–4.0)
MCH: 28.8 pg (ref 26.0–34.0)
MCHC: 32.3 g/dL (ref 30.0–36.0)
MCV: 89.2 fL (ref 80.0–100.0)
Monocytes Absolute: 1.1 10*3/uL — ABNORMAL HIGH (ref 0.1–1.0)
Monocytes Relative: 16 %
Neutro Abs: 5.1 10*3/uL (ref 1.7–7.7)
Neutrophils Relative %: 72 %
Platelets: 202 10*3/uL (ref 150–400)
RBC: 4.27 MIL/uL (ref 4.22–5.81)
RDW: 13.6 % (ref 11.5–15.5)
WBC: 7.2 10*3/uL (ref 4.0–10.5)
nRBC: 0 % (ref 0.0–0.2)

## 2023-06-09 LAB — MAGNESIUM: Magnesium: 2.2 mg/dL (ref 1.7–2.4)

## 2023-06-09 LAB — SARS CORONAVIRUS 2 BY RT PCR: SARS Coronavirus 2 by RT PCR: NEGATIVE

## 2023-06-09 LAB — CULTURE, BLOOD (ROUTINE X 2): Special Requests: ADEQUATE

## 2023-06-09 LAB — PHOSPHORUS: Phosphorus: 2.8 mg/dL (ref 2.5–4.6)

## 2023-06-09 MED ORDER — AZITHROMYCIN 250 MG PO TABS
500.0000 mg | ORAL_TABLET | Freq: Every day | ORAL | Status: AC
  Administered 2023-06-10 – 2023-06-11 (×2): 500 mg via ORAL
  Filled 2023-06-09 (×2): qty 2

## 2023-06-09 MED ORDER — IPRATROPIUM BROMIDE 0.02 % IN SOLN
0.5000 mg | Freq: Three times a day (TID) | RESPIRATORY_TRACT | Status: DC
  Administered 2023-06-09 – 2023-06-19 (×32): 0.5 mg via RESPIRATORY_TRACT
  Filled 2023-06-09 (×32): qty 2.5

## 2023-06-09 MED ORDER — LEVALBUTEROL HCL 0.63 MG/3ML IN NEBU
0.6300 mg | INHALATION_SOLUTION | Freq: Three times a day (TID) | RESPIRATORY_TRACT | Status: DC
  Administered 2023-06-09 – 2023-06-19 (×32): 0.63 mg via RESPIRATORY_TRACT
  Filled 2023-06-09 (×32): qty 3

## 2023-06-09 NOTE — Plan of Care (Signed)

## 2023-06-09 NOTE — Progress Notes (Signed)
 PROGRESS NOTE    Corey [name]  FMW:[id number] DOB: June 14, 1934 DOA: 06/07/2023 PCP: Charlott Dorn LABOR, MD   Brief Narrative:  The patient is a 89 year old Caucasian male with a past medical history significant for but not limited to paroxysmal atrial fibrillation on anticoagulation with Eliquis , history of BPH, recently diagnosed neuroendocrine liver tumors with unknown primary being admitted to the hospital with weakness and shortness of breath.  Patient was noted to be hypoxic and had acute respiratory failure and most of the history is provided by his daughter and his wife and unfortunately the patient had been recently diagnosed with stage IV metastatic neuroendocrine tumor of unknown primary in November 2024.  Since that time he was started on Afinitor  but was unable to tolerate it due to significant fatigue and cough.  He was awaiting consultation with Dr. Malva for Lutathera Therapy and was supposed to have consultation with him yesterday but given his weakness and shortness of breath he came to the hospital.  He was recently seen by his oncologist on Monday and he had developed a cough which was dry and so the oncologist ordered a chest x-ray which revealed pneumonia.  He was then seen by his cardiologist on 06/05/2023 who felt that based on his presentation the patient may be a heart failure so he was started on Lasix .  He had a good response to Lasix  and his poor oral edema resolved however his cough and dyspnea continued.  Given this he presented to the ED and further workup showed evidence of progressive pulmonary consolidation.  He was initiated on IV antibiotics with empiric azithromycin  and Rocephin  and admitted and currently repeat chest x-ray done and showed Interval worsening multifocal left lung pneumonia. Low lung volumes with right lung involvement not fully excluded. Followup PA and lateral chest X-ray is recommended in 3-4 weeks following therapy to ensure resolution. Aortic  Atherosclerosis and Emphysema  His breathing treatments have been adjusted and we have added guaifenesin , flutter valve and incentive spirometry.  Will continue current plan of care for now and if he is not improving we will obtain a CT scan of the chest without contrast in the a.m. and potentially add steroids.  Assessment and Plan:  Acute Respiratory Failure with Hypoxia in the setting of CAP -Presented with acute respiratory failure with hypoxia and had a reported pulse oximetry of 85% on room air at home -Most likely etiology of his hypoxia due to community-acquired pneumonia and PE was considered in the setting of his malignancy but is unlikely given that the patient is already anticoagulated Eliquis  and has no pleuritic chest pain and had no evidence of tachycardia on admission SpO2: 94 % O2 Flow Rate (L/min): 3 L/min FiO2 (%): (!) 3 % -Continuous pulse oximetry and maintain O2 saturations greater than 90% -Will add incentive spirometry, guaifenesin  1200 mg p.o. twice daily and flutter valve -Will continue current management but he is not improving very much in the next few days we will need to discuss with pulmonary.  Will also consider a CT of the chest without contrast to help evaluate other etiologies of hypoxia such as interstitial lung disease -Given that the patient has been on everolimus  for about 3 weeks we can also potentially cause noninfectious pneumonitis and if he does not respond to the IV antibiotics we may consider adding IV steroids. -Also added Xopenex /Atrovent  every 6 scheduled -Supplemental oxygen via nasal cannula wean O2 as tolerated -Will need an ambulatory home O2 screen and repeat chest x-ray  prior to discharge  ? Bacteremia -1/4 showed GPC and likely a contaminant and this was Staph hominis -Repeat Blood Cx x2 yesterday pending -C/w Abx as above  Community-Acquired Pneumonia -Has had progressive cough, hypoxia, multilobar pneumonia seen on chest x-ray and  has been progressive when compared to x-ray from 06/04/2023.  No fever, leukocytosis, or other evidence of sepsis. -Observation admission but will change to Inpatient  -Continue supplemental oxygen if needed -C/w Empiric IV azithromycin  and IV Rocephin  for now -Incentive spirometer, and flutter valve and Guaifenesin  -Treatment as above -Repeat CXR yesterday AM done and showed Interval worsening multifocal left lung pneumonia. Low lung volumes with right lung involvement not fully excluded. Followup PA and lateral chest X-ray is recommended in 3-4 weeks following therapy to ensure resolution. Aortic atherosclerosis and Emphysema. -Chest x-ray today done and showed Further radiographic progression of multilobar Left lung pneumonia. Comparatively mild right lung involvement. No pleural effusion.   Chronic Diastolic CHF -he responded well to several days of p.o. Lasix , currently has no evidence of fluid overload. -Hold off on further diuresis since euvolemic, and has some hyponatremia -Strict I's an O's and Daily Weights  Intake/Output Summary (Last 24 hours) at 06/09/2023 1638 Last data filed at 06/09/2023 1500 Gross per 24 hour  Intake 990 ml  Output 501 ml  Net 489 ml  -ECHOCardiogram done and showed an EF of 50 to 55% with a LVEF having low normal function with no regional wall motion abnormalities in the left ventricular diastolic parameters consistent with grade 1 diastolic dysfunction with a right ventricular systolic function also being moderately reduced -Plan outpatient cardiology follow-up   Stage IV Neuroendocrine Tumor -Notified Dr. Lanny as a courtesy and she evaluated and will follow-up with him on Monday and recommend continue antibiotics for now -Patient is status post first-line Everolimus  -Will need outpatient follow-up with Dr. Lanny and Dr. Malva as previously planned   Paroxysmal Atrial Fibrillation -Continue to Monitor on Telemetry  -Continue Flecainide  100 mg po BID  and Anticoagulation with Apixaban  5 mg po BID    Hypertension -Continue home ARB with Irbesartan  300 mg po Daily -Continue to Monitor BP per Protocol -Last BP Reading was 122/77   Depression and Anxiety -C/w Buproprion 150 mg po Daily   Normocytic Anemia -Hgb/Hct Trend: Recent Labs  Lab 06/04/23 1315 06/07/23 0909 06/08/23 0546 06/09/23 0841  HGB 13.5 13.5 12.0* 12.3*  HCT 39.5 39.0 35.8* 38.1*  -Check Anemia Panel in the AM -Continue to Monitor for S/Sx of Bleeding; No overt bleeding noted -Repeat CBC in the AM  Hyponatremia, improving slowly  -In the setting of Diuretics and suspected to be hypovolemic hyponatremia due to his recent reduced oral intake as well as diuretic usage -Currently holding further diuresis as well as his home HCTZ and he was given a 1 L IV fluid bolus in the ED and will hold off further fluids -Na+ Trend: Recent Labs  Lab 05/11/23 1042 06/04/23 1315 06/07/23 0909 06/08/23 0546 06/09/23 0841  NA 136 130* 129* 132* 134*  -Continue to Monitor and Trend and repeat CMP in the AM   Hypokalemia -Patient's K+ Level Trend: Recent Labs  Lab 05/11/23 1042 06/04/23 1315 06/07/23 0909 06/08/23 0546 06/09/23 0841  K 5.4* 3.5 3.5 3.2* 3.8  -Replete with po KCL 40 mEQ BID x2 yesterday  -Continue to Monitor and Replete as Necessary -Repeat CMP in the AM   HLD -C/w Atorvastatin  20 mg p.o. daily but may need to hold given mildly abnormal  AST  Hyperbilirubinemia, improved  -Mild and Likely Reactive -Bilirubin Trend: Recent Labs  Lab 05/11/23 1042 06/04/23 1315 06/07/23 0909 06/08/23 0546 06/09/23 0841  BILITOT 1.4* 1.3* 1.5* 1.3* 1.0  -Continue to Monitor and Trend and repeat CMP in the AM   GERD/GI Prophylaxis -C/w PPI with Pantoprazole  40 mg Daily   Hypoalbuminemia -Patient's Albumin Trend: Recent Labs  Lab 05/11/23 1042 06/04/23 1315 06/07/23 0909 06/08/23 0546 06/09/23 0841  ALBUMIN 3.6 3.6 2.8* 2.4* 2.5*  -Continue to Monitor  and Trend and repeat CMP in the AM   DVT prophylaxis: SCDs Start: 06/07/23 1231 apixaban  (ELIQUIS ) tablet 5 mg    Code Status: Full Code Family Communication: No family present at bedside   Disposition Plan:  Level of care: Telemetry Status is: Inpatient Remains inpatient appropriate because: Needs further clinical improvement in his Respiratory Status    Consultants:  Medical Oncology   Procedures:  As delineated as above  Antimicrobials:  Anti-infectives (From admission, onward)    Start     Dose/Rate Route Frequency Ordered Stop   06/10/23 1000  azithromycin  (ZITHROMAX ) tablet 500 mg        500 mg Oral Daily 06/09/23 1252 06/12/23 0959   06/08/23 1200  cefTRIAXone  (ROCEPHIN ) 2 g in sodium chloride  0.9 % 100 mL IVPB        2 g 200 mL/hr over 30 Minutes Intravenous Every 24 hours 06/07/23 1232     06/08/23 1100  azithromycin  (ZITHROMAX ) 500 mg in sodium chloride  0.9 % 250 mL IVPB  Status:  Discontinued        500 mg 250 mL/hr over 60 Minutes Intravenous Every 24 hours 06/07/23 1232 06/09/23 1252   06/07/23 1115  cefTRIAXone  (ROCEPHIN ) 1 g in sodium chloride  0.9 % 100 mL IVPB        1 g 200 mL/hr over 30 Minutes Intravenous  Once 06/07/23 1103 06/07/23 1353   06/07/23 1115  azithromycin  (ZITHROMAX ) 500 mg in sodium chloride  0.9 % 250 mL IVPB        500 mg 250 mL/hr over 60 Minutes Intravenous  Once 06/07/23 1103 06/07/23 1522       Subjective: Seen and examined at bedside and was not feeling good at all and was complaining that he got Lasix  again last night but this is not true given that have been discontinued off tomorrow.  States his breathing is mildly improved but continues to feel short of breath and coughing up white sputum.  No other concerns or complaints this time but feels very weak.  Objective: Vitals:   06/09/23 0357 06/09/23 0737 06/09/23 1309 06/09/23 1443  BP: 122/63  122/77   Pulse: 71  66   Resp: 18  16   Temp: 97.9 F (36.6 C)  98.1 F (36.7 C)    TempSrc:   Oral   SpO2: 92% 90% 94% 94%  Weight:      Height:        Intake/Output Summary (Last 24 hours) at 06/09/2023 1658 Last data filed at 06/09/2023 1500 Gross per 24 hour  Intake 990 ml  Output 501 ml  Net 489 ml   Filed Weights   06/07/23 1628  Weight: 86.7 kg   Examination: Physical Exam:  Constitutional: Elderly overweight chronically ill-appearing Caucasian male who continues to feel SOB Respiratory: Diminished to auscultation bilaterally with coarse breath sounds and has some rhonchi but no rales or crackles. Normal respiratory effort and patient is not tachypenic. No accessory muscle use. Wearing supplemental O2  via [place] Cardiovascular: RRR, no murmurs / rubs / gallops. S1 and S2 auscultated. No extremity edema. s.  Abdomen: Soft, non-tender, non-distended. Bowel sounds positive.  GU: Deferred. Musculoskeletal: No clubbing / cyanosis of digits/nails. No joint deformity upper and lower extremities.  Skin: No rashes, lesions, ulcers on a limited skin evaluation. No induration; Warm and dry.  Neurologic: CN 2-12 grossly intact with no focal deficits. Romberg sign and cerebellar reflexes not assessed.  Psychiatric: Normal judgment and insight. Alert and oriented x 3. Normal mood and appropriate affect.   Data Reviewed: I have personally reviewed following labs and imaging studies  CBC: Recent Labs  Lab 06/04/23 1315 06/07/23 0909 06/08/23 0546 06/09/23 0841  WBC 5.3 6.4 5.4 7.2  NEUTROABS 3.8 4.9  --  5.1  HGB 13.5 13.5 12.0* 12.3*  HCT 39.5 39.0 35.8* 38.1*  MCV 87.4 86.9 87.7 89.2  PLT 130* 151 162 202   Basic Metabolic Panel: Recent Labs  Lab 06/04/23 1315 06/07/23 0909 06/08/23 0546 06/09/23 0841  NA 130* 129* 132* 134*  K 3.5 3.5 3.2* 3.8  CL 92* 91* 98 99  CO2 30 27 25 24   GLUCOSE 211* 143* 119* 193*  BUN 22 17 15 17   CREATININE 1.29* 1.11 0.95 0.94  CALCIUM  8.7* 8.4* 7.9* 8.4*  MG  --   --   --  2.2  PHOS  --   --   --  2.8    GFR: Estimated Creatinine Clearance: 63.8 mL/min (by C-G formula based on SCr of 0.94 mg/dL). Liver Function Tests: Recent Labs  Lab 06/04/23 1315 06/07/23 0909 06/08/23 0546 06/09/23 0841  AST 56* 49* 41 47*  ALT 44 36 30 32  ALKPHOS 584* 456* 370* 383*  BILITOT 1.3* 1.5* 1.3* 1.0  PROT 7.0 6.5 5.8* 6.1*  ALBUMIN 3.6 2.8* 2.4* 2.5*   Recent Labs  Lab 06/07/23 0909  LIPASE 38   No results for input(s): AMMONIA in the last 168 hours. Coagulation Profile: No results for input(s): INR, PROTIME in the last 168 hours. Cardiac Enzymes: No results for input(s): CKTOTAL, CKMB, CKMBINDEX, TROPONINI in the last 168 hours. BNP (last 3 results) No results for input(s): PROBNP in the last 8760 hours. HbA1C: No results for input(s): HGBA1C in the last 72 hours. CBG: No results for input(s): GLUCAP in the last 168 hours. Lipid Profile: No results for input(s): CHOL, HDL, LDLCALC, TRIG, CHOLHDL, LDLDIRECT in the last 72 hours. Thyroid  Function Tests: No results for input(s): TSH, T4TOTAL, FREET4, T3FREE, THYROIDAB in the last 72 hours. Anemia Panel: No results for input(s): VITAMINB12, FOLATE, FERRITIN, TIBC, IRON, RETICCTPCT in the last 72 hours. Sepsis Labs: No results for input(s): PROCALCITON, LATICACIDVEN in the last 168 hours.  Recent Results (from the past 240 hours)  Culture, blood (routine x 2)     Status: None (Preliminary result)   Collection Time: 06/07/23 11:58 AM   Specimen: BLOOD  Result Value Ref Range Status   Specimen Description   Final    BLOOD LEFT ANTECUBITAL Performed at [facility], [address], [place], [place] [address]    Special Requests   Final    BOTTLES DRAWN AEROBIC AND ANAEROBIC Blood Culture adequate volume Performed at [facility], [address], [place], [place] [address]    Culture   Final    NO GROWTH 2 DAYS Performed at [facility], [address], [place], [place] [address]    Report Status PENDING  Incomplete  Culture, blood (routine x 2)     Status: Abnormal   Collection Time: 06/07/23 11:58 AM   Specimen: BLOOD  Result Value Ref Range Status   Specimen Description   Final    BLOOD RIGHT ANTECUBITAL Performed at [facility], [address], [place], [place] [address]    Special Requests   Final    BOTTLES DRAWN AEROBIC AND ANAEROBIC Blood Culture adequate volume Performed at [facility], [address], [place], [place] [address]    Culture  Setup Time   Final    GRAM POSITIVE COCCI IN BOTH AEROBIC AND ANAEROBIC BOTTLES CRITICAL RESULT CALLED TO, READ BACK BY AND VERIFIED WITH: PHARMD SYDNEY, [id number] [phone] FCP    Culture (A)  Final    STAPHYLOCOCCUS HOMINIS THE SIGNIFICANCE OF ISOLATING THIS ORGANISM FROM A SINGLE SET OF BLOOD CULTURES WHEN MULTIPLE SETS ARE DRAWN IS UNCERTAIN. PLEASE NOTIFY THE MICROBIOLOGY DEPARTMENT WITHIN ONE WEEK IF SPECIATION AND SENSITIVITIES ARE REQUIRED. Performed at [facility], [address], [place], [place] [address]    Report Status 06/09/2023 FINAL  Final  Blood Culture ID Panel (Reflexed)     Status: Abnormal   Collection Time: 06/07/23 11:58 AM  Result Value Ref Range Status   Enterococcus faecalis NOT DETECTED NOT DETECTED Final   Enterococcus Faecium NOT DETECTED NOT DETECTED Final   Listeria monocytogenes NOT DETECTED NOT DETECTED Final   Staphylococcus species DETECTED (A) NOT DETECTED Final    Comment: CRITICAL RESULT CALLED TO, READ BACK BY AND VERIFIED WITH: PHARMD SYDNEY, D [phone] FCP    Staphylococcus aureus (BCID) NOT DETECTED NOT DETECTED Final   Staphylococcus epidermidis NOT DETECTED NOT DETECTED Final   Staphylococcus lugdunensis NOT DETECTED NOT DETECTED Final   Streptococcus species NOT DETECTED NOT DETECTED Final   Streptococcus agalactiae NOT DETECTED NOT DETECTED Final   Streptococcus  pneumoniae NOT DETECTED NOT DETECTED Final   Streptococcus pyogenes NOT DETECTED NOT DETECTED Final   A.calcoaceticus-baumannii NOT DETECTED NOT DETECTED Final   Bacteroides fragilis NOT DETECTED NOT DETECTED Final   Enterobacterales NOT DETECTED NOT DETECTED Final   Enterobacter cloacae complex NOT DETECTED NOT DETECTED Final   Escherichia coli NOT DETECTED NOT DETECTED Final   Klebsiella aerogenes NOT DETECTED NOT DETECTED Final   Klebsiella oxytoca NOT DETECTED NOT DETECTED Final   Klebsiella pneumoniae NOT DETECTED NOT DETECTED Final   Proteus species NOT DETECTED NOT DETECTED Final   Salmonella species NOT DETECTED NOT DETECTED Final   Serratia marcescens NOT DETECTED NOT DETECTED Final   Haemophilus influenzae NOT DETECTED NOT DETECTED Final   Neisseria meningitidis NOT DETECTED NOT DETECTED Final   Pseudomonas aeruginosa NOT DETECTED NOT DETECTED Final   Stenotrophomonas maltophilia NOT DETECTED NOT DETECTED Final   Candida albicans NOT DETECTED NOT DETECTED Final   Candida auris NOT DETECTED NOT DETECTED Final   Candida glabrata NOT DETECTED NOT DETECTED Final   Candida krusei NOT DETECTED NOT DETECTED Final   Candida parapsilosis NOT DETECTED NOT DETECTED Final   Candida tropicalis NOT DETECTED NOT DETECTED Final   Cryptococcus neoformans/gattii NOT DETECTED NOT DETECTED Final    Comment: Performed at [facility], [address], [place], [place] [address]  Culture, blood (Routine X 2) w Reflex to ID Panel     Status: None (Preliminary result)   Collection Time: 06/08/23 10:12 AM   Specimen: BLOOD  Result Value Ref Range Status   Specimen Description   Final  BLOOD BLOOD LEFT HAND AEROBIC BOTTLE ONLY Performed at [facility], [address] [place], [place] [address]    Special Requests   Final    BOTTLES DRAWN AEROBIC ONLY Blood Culture results may not be optimal due to an inadequate volume of blood received in culture bottles Performed at  [facility], [address], [place], [place] [address]    Culture   Final    NO GROWTH < 24 HOURS Performed at [facility], [address], [place], [place] [address]    Report Status PENDING  Incomplete  Culture, blood (Routine X 2) w Reflex to ID Panel     Status: None (Preliminary result)   Collection Time: 06/08/23 10:12 AM   Specimen: BLOOD  Result Value Ref Range Status   Specimen Description   Final    BLOOD BLOOD RIGHT HAND AEROBIC BOTTLE ONLY Performed at [facility], [address], [place], [place] [address]    Special Requests   Final    BOTTLES DRAWN AEROBIC ONLY Blood Culture results may not be optimal due to an inadequate volume of blood received in culture bottles Performed at [facility], [address] [place], [place] [address]    Culture   Final    NO GROWTH < 24 HOURS Performed at [facility], [address], [place], [place] [address]    Report Status PENDING  Incomplete  Respiratory (~20 pathogens) panel by PCR     Status: None   Collection Time: 06/08/23  6:34 PM   Specimen: Nasopharyngeal Swab; Respiratory  Result Value Ref Range Status   Adenovirus NOT DETECTED NOT DETECTED Final   Coronavirus 229E NOT DETECTED NOT DETECTED Final    Comment: (NOTE) The Coronavirus on the Respiratory Panel, DOES NOT test for the novel  Coronavirus (2019 nCoV)    Coronavirus HKU1 NOT DETECTED NOT DETECTED Final   Coronavirus NL63 NOT DETECTED NOT DETECTED Final   Coronavirus OC43 NOT DETECTED NOT DETECTED Final   Metapneumovirus NOT DETECTED NOT DETECTED Final   Rhinovirus / Enterovirus NOT DETECTED NOT DETECTED Final   Influenza A NOT DETECTED NOT DETECTED Final   Influenza B NOT DETECTED NOT DETECTED Final   Parainfluenza Virus 1 NOT DETECTED NOT DETECTED Final   Parainfluenza Virus 2 NOT DETECTED NOT DETECTED Final   Parainfluenza Virus 3 NOT DETECTED NOT DETECTED Final   Parainfluenza  Virus 4 NOT DETECTED NOT DETECTED Final   Respiratory Syncytial Virus NOT DETECTED NOT DETECTED Final   Bordetella pertussis NOT DETECTED NOT DETECTED Final   Bordetella Parapertussis NOT DETECTED NOT DETECTED Final   Chlamydophila pneumoniae NOT DETECTED NOT DETECTED Final   Mycoplasma pneumoniae NOT DETECTED NOT DETECTED Final    Comment: Performed at [facility], [address], [place], [place] [address]    Radiology Studies: DG CHEST PORT 1 VIEW Result Date: 06/09/2023 CLINICAL DATA:  89 year old male with shortness of breath. Cough and evidence of left lung pneumonia on radiographs. EXAM: PORTABLE CHEST 1 VIEW COMPARISON:  Portable chest yesterday and earlier. FINDINGS: Portable AP semi upright view at 0602 hours. Progressive confluence of widespread abnormal left lung opacity, especially above the left hilum now. Stable lung volumes. Stable cardiac size and mediastinal contours. Right lung remains less affected, there is mild patchy peripheral right mid lung opacity near the minor fissure. No pleural effusion. No pneumothorax. As is fissure (normal variant). Calcified aortic atherosclerosis. Paucity of bowel gas.  Stable visualized osseous structures. IMPRESSION: 1. Further radiographic progression of multilobar Left lung pneumonia. 2. Comparatively mild right lung involvement.  No pleural effusion. Electronically Signed   By: VEAR Hurst M.D.   On: 06/09/2023 08:24   DG CHEST PORT 1 VIEW Result Date: 06/08/2023 CLINICAL DATA:  [id number] SOB (shortness of breath) [id number] EXAM: PORTABLE CHEST 1 VIEW COMPARISON:  Chest x-ray 06/07/2023, PET CT 04/16/2023, CT chest 03/21/2023 FINDINGS: The heart and mediastinal contours are unchanged. Atherosclerotic plaque. Low lung volumes. Azygous fissure incidentally noted. Interval increase in patchy airspace opacities of the left lung. Coarsened interstitial markings with cystic changes. No pulmonary edema. No pleural effusion. No pneumothorax. No acute osseous  abnormality. IMPRESSION: 1. Interval worsening multifocal left lung pneumonia. Low lung volumes with right lung involvement not fully excluded. Followup PA and lateral chest X-ray is recommended in 3-4 weeks following therapy to ensure resolution. 2. Aortic Atherosclerosis (ICD10-I70.0) and Emphysema (ICD10-J43.9). Electronically Signed   By: Morgane  Naveau M.D.   On: 06/08/2023 10:12   Scheduled Meds:  apixaban   5 mg Oral BID   atorvastatin   20 mg Oral Daily   [START ON 06/10/2023] azithromycin   500 mg Oral Daily   buPROPion   150 mg Oral q morning   busPIRone   5 mg Oral QHS   feeding supplement  237 mL Oral BID BM   flecainide   100 mg Oral BID   guaiFENesin   1,200 mg Oral BID   ipratropium  0.5 mg Nebulization TID   irbesartan   300 mg Oral Daily   levalbuterol   0.63 mg Nebulization TID   mirtazapine   15 mg Oral QHS   pantoprazole   40 mg Oral Daily   Continuous Infusions:  cefTRIAXone  (ROCEPHIN )  IV 2 g (06/09/23 1449)    LOS: 1 day   Alejandro Marker, DO [facility] Available via Epic secure chat 7am-7pm After these hours, please refer to coverage provider listed on [email or web address] 06/09/2023, 4:58 PM

## 2023-06-09 NOTE — Plan of Care (Signed)
  Problem: Education: Goal: Knowledge of General Education information will improve Description: Including pain rating scale, medication(s)/side effects and non-pharmacologic comfort measures Outcome: Progressing   Problem: Clinical Measurements: Goal: Ability to maintain clinical measurements within normal limits will improve Outcome: Progressing Goal: Will remain free from infection Outcome: Progressing Goal: Respiratory complications will improve Outcome: Progressing   Problem: Nutrition: Goal: Adequate nutrition will be maintained Outcome: Progressing   Problem: Pain Management: Goal: General experience of comfort will improve Outcome: Progressing   Problem: Safety: Goal: Ability to remain free from injury will improve Outcome: Progressing

## 2023-06-09 NOTE — Plan of Care (Signed)
  Problem: Education: Goal: Knowledge of General Education information will improve Description: Including pain rating scale, medication(s)/side effects and non-pharmacologic comfort measures Outcome: Progressing   Problem: Clinical Measurements: Goal: Ability to maintain clinical measurements within normal limits will improve Outcome: Progressing Goal: Respiratory complications will improve Outcome: Progressing   Problem: Nutrition: Goal: Adequate nutrition will be maintained Outcome: Progressing   Problem: Pain Management: Goal: General experience of comfort will improve Outcome: Progressing   Problem: Safety: Goal: Ability to remain free from injury will improve Outcome: Progressing

## 2023-06-10 ENCOUNTER — Encounter (HOSPITAL_COMMUNITY): Payer: Self-pay | Admitting: Internal Medicine

## 2023-06-10 ENCOUNTER — Inpatient Hospital Stay (HOSPITAL_COMMUNITY): Payer: Medicare Other

## 2023-06-10 DIAGNOSIS — J9601 Acute respiratory failure with hypoxia: Secondary | ICD-10-CM | POA: Diagnosis present

## 2023-06-10 DIAGNOSIS — J189 Pneumonia, unspecified organism: Secondary | ICD-10-CM | POA: Diagnosis not present

## 2023-06-10 DIAGNOSIS — D3A8 Other benign neuroendocrine tumors: Secondary | ICD-10-CM | POA: Diagnosis not present

## 2023-06-10 LAB — CBC WITH DIFFERENTIAL/PLATELET
Abs Immature Granulocytes: 0.02 10*3/uL (ref 0.00–0.07)
Basophils Absolute: 0.1 10*3/uL (ref 0.0–0.1)
Basophils Relative: 1 %
Eosinophils Absolute: 0.4 10*3/uL (ref 0.0–0.5)
Eosinophils Relative: 5 %
HCT: 36.1 % — ABNORMAL LOW (ref 39.0–52.0)
Hemoglobin: 12.3 g/dL — ABNORMAL LOW (ref 13.0–17.0)
Immature Granulocytes: 0 %
Lymphocytes Relative: 10 %
Lymphs Abs: 0.8 10*3/uL (ref 0.7–4.0)
MCH: 30.2 pg (ref 26.0–34.0)
MCHC: 34.1 g/dL (ref 30.0–36.0)
MCV: 88.7 fL (ref 80.0–100.0)
Monocytes Absolute: 1.2 10*3/uL — ABNORMAL HIGH (ref 0.1–1.0)
Monocytes Relative: 15 %
Neutro Abs: 5.5 10*3/uL (ref 1.7–7.7)
Neutrophils Relative %: 69 %
Platelets: 219 10*3/uL (ref 150–400)
RBC: 4.07 MIL/uL — ABNORMAL LOW (ref 4.22–5.81)
RDW: 13.6 % (ref 11.5–15.5)
WBC: 7.9 10*3/uL (ref 4.0–10.5)
nRBC: 0 % (ref 0.0–0.2)

## 2023-06-10 LAB — COMPREHENSIVE METABOLIC PANEL
ALT: 30 U/L (ref 0–44)
AST: 51 U/L — ABNORMAL HIGH (ref 15–41)
Albumin: 2.5 g/dL — ABNORMAL LOW (ref 3.5–5.0)
Alkaline Phosphatase: 378 U/L — ABNORMAL HIGH (ref 38–126)
Anion gap: 10 (ref 5–15)
BUN: 21 mg/dL (ref 8–23)
CO2: 29 mmol/L (ref 22–32)
Calcium: 8.5 mg/dL — ABNORMAL LOW (ref 8.9–10.3)
Chloride: 97 mmol/L — ABNORMAL LOW (ref 98–111)
Creatinine, Ser: 1.01 mg/dL (ref 0.61–1.24)
GFR, Estimated: >60 mL/min (ref >60)
Glucose, Bld: 124 mg/dL — ABNORMAL HIGH (ref 70–99)
Potassium: 4.2 mmol/L (ref 3.5–5.1)
Sodium: 136 mmol/L (ref 135–145)
Total Bilirubin: 1.1 mg/dL (ref 0.0–1.2)
Total Protein: 6.1 g/dL — ABNORMAL LOW (ref 6.5–8.1)

## 2023-06-10 LAB — RETICULOCYTES
Immature Retic Fract: 15.3 % (ref 2.3–15.9)
RBC.: 4.19 MIL/uL — ABNORMAL LOW (ref 4.22–5.81)
Retic Count, Absolute: 55.3 10*3/uL (ref 19.0–186.0)
Retic Ct Pct: 1.3 % (ref 0.4–3.1)

## 2023-06-10 LAB — IRON AND TIBC
Iron: 30 ug/dL — ABNORMAL LOW (ref 45–182)
Saturation Ratios: 10 % — ABNORMAL LOW (ref 17.9–39.5)
TIBC: 288 ug/dL (ref 250–450)
UIBC: 258 ug/dL

## 2023-06-10 LAB — FOLATE: Folate: 4.9 ng/mL — ABNORMAL LOW (ref >5.9)

## 2023-06-10 LAB — FERRITIN: Ferritin: 84 ng/mL (ref 24–336)

## 2023-06-10 LAB — PHOSPHORUS: Phosphorus: 3.3 mg/dL (ref 2.5–4.6)

## 2023-06-10 LAB — VITAMIN B12: Vitamin B-12: 888 pg/mL (ref 180–914)

## 2023-06-10 LAB — MAGNESIUM: Magnesium: 2.3 mg/dL (ref 1.7–2.4)

## 2023-06-10 MED ORDER — METHYLPREDNISOLONE SODIUM SUCC 125 MG IJ SOLR
125.0000 mg | Freq: Once | INTRAMUSCULAR | Status: AC
  Administered 2023-06-10: 125 mg via INTRAVENOUS
  Filled 2023-06-10: qty 2

## 2023-06-10 MED ORDER — METHYLPREDNISOLONE SODIUM SUCC 125 MG IJ SOLR
125.0000 mg | Freq: Two times a day (BID) | INTRAMUSCULAR | Status: AC
  Administered 2023-06-11 – 2023-06-13 (×5): 125 mg via INTRAVENOUS
  Filled 2023-06-10 (×5): qty 2

## 2023-06-10 MED ORDER — FOLIC ACID 5 MG/ML IJ SOLN
1.0000 mg | Freq: Every day | INTRAMUSCULAR | Status: DC
  Administered 2023-06-10 – 2023-06-11 (×2): 1 mg via INTRAVENOUS
  Filled 2023-06-10 (×2): qty 0.2

## 2023-06-10 MED ORDER — ARFORMOTEROL TARTRATE 15 MCG/2ML IN NEBU: 15.0000 ug | INHALATION_SOLUTION | Freq: Two times a day (BID) | RESPIRATORY_TRACT | Status: DC

## 2023-06-10 MED ORDER — IOHEXOL 350 MG/ML SOLN
75.0000 mL | Freq: Once | INTRAVENOUS | Status: AC | PRN
  Administered 2023-06-10: 75 mL via INTRAVENOUS

## 2023-06-10 MED ORDER — BUDESONIDE 0.25 MG/2ML IN SUSP
0.2500 mg | Freq: Two times a day (BID) | RESPIRATORY_TRACT | Status: DC
  Administered 2023-06-10 – 2023-06-19 (×18): 0.25 mg via RESPIRATORY_TRACT
  Filled 2023-06-10 (×18): qty 2

## 2023-06-10 MED ORDER — FUROSEMIDE 10 MG/ML IJ SOLN
40.0000 mg | Freq: Once | INTRAMUSCULAR | Status: AC
  Administered 2023-06-10: 40 mg via INTRAVENOUS
  Filled 2023-06-10: qty 4

## 2023-06-10 NOTE — Consult Note (Signed)
 NAME:  Corey [name], MRN:  [id number], DOB:  22-Dec-1934, LOS: 2 ADMISSION DATE:  06/07/2023, CONSULTATION DATE: 06/10/2023 REFERRING MD: Dr. Sherrill, CHIEF COMPLAINT: Acute hypoxemic respiratory failure  History of Present Illness:  89 year old man with a history of former tobacco, newly diagnosed metastatic neuroendocrine tumor followed by Dr. Lanny.  Also with atrial fibrillation (now NSR), hypertension, CAD with a reassuring cardiac stress test 2023.  EF 51%.  For his neuroendocrine tumor he has been treated initially with Lutathera (stopped for transaminitis) and then everolimus  for 3 weeks (stopped 1/6).  At that visit 1/6 he was found to be hypoxemic and had patchy bilateral pulmonary infiltrates on chest x-ray.  Subsequently seen by Dr. Mona with cardiology and started on furosemide  for possible component of pulmonary edema.  Now admitted 06/07/2023 with progressive dyspnea and cough.  He was started on azithromycin  and ceftriaxone  for possible community-acquired pneumonia.  Chest x-ray at the time of admission shows progression of left-sided hazy pulmonary infiltrates.  Pertinent  Medical History   Past Medical History:  Diagnosis Date   Basal cell carcinoma of auricle of right ear    BPH (benign prostatic hyperplasia)    DJD (degenerative joint disease)    left   ED (erectile dysfunction)    Fatigue    Hearing loss, sensorineural    Hx of adenomatous colonic polyps    Hx of major depression    Hypertension    Iron deficiency anemia    Nephrolithiasis    Paroxysmal atrial fibrillation (HCC)    Seasonal allergic rhinitis   Metastatic neuroendocrine tumor Atrial fibrillation   Significant Hospital Events: Including procedures, antibiotic start and stop dates in addition to other pertinent events     Interim History / Subjective:  Feeling okay Still having some cough, has had dry cough for about 3 weeks  Objective   Blood pressure 115/60, pulse 75, temperature 97.8 F  (36.6 C), resp. rate 17, height 6' 1 (1.854 m), weight 86.7 kg, SpO2 91%.       No intake or output data in the 24 hours ending 06/10/23 1649 Filed Weights   06/07/23 1628  Weight: 86.7 kg    Examination: General: Elderly man laying in bed in no distress on nasal cannula oxygen HENT: Oropharynx clear, strong voice, no secretions, no stridor Lungs: Soft left inspiratory crackles, clear on the right, no wheezing Cardiovascular: Regular, distant, no murmur Abdomen: Nondistended with positive bowel sounds Extremities: No peripheral edema Neuro: Awake, alert, interacting appropriately, follows commands GU: Deferred  Resolved Hospital Problem list     Assessment & Plan:  Acute hypoxemic respiratory failure with pulmonary infiltrates.  Differential diagnosis includes pneumonia although WBC reassuring, pneumonitis due to everolimus  (just stopped recently), asymmetric cardiogenic pulmonary edema.  PE would seem very unlikely with pulmonary infiltrates and also on apixaban  as an outpatient -CT chest has been ordered and pending -Agree with empiric antibiotics for now, ceftriaxone  and azithromycin  -Obtain culture data a possible -Check procalcitonin, BNP -Single dose Solu-Medrol  125 mg given 1/12.  Based on suspicion that this could be medication related pneumonitis believe it is reasonable to continue >> solumedrol 125mg  bid x 2 days and then change to pred 40-60mg  for a taper.  Not really in a position given his oxygen needs to consider bronchoscopy for culture, cell count -Empiric diuresis pending BNP -Follow serial chest x-ray  Blood culture Staph hominis 1 of 4 -Unclear significance, suspect contaminant -Currently covered with ceftriaxone   Hypertension Paroxysmal atrial fibrillation HFpEF -Diuretics  as ordered -BMP pending  Metastatic neuroendocrine tumor -Appreciate Dr. Demetra assistance  Hyponatremia Hypokalemia    Labs   CBC: Recent Labs  Lab 06/04/23 1315  06/07/23 0909 06/08/23 0546 06/09/23 0841 06/10/23 0610  WBC 5.3 6.4 5.4 7.2 7.9  NEUTROABS 3.8 4.9  --  5.1 5.5  HGB 13.5 13.5 12.0* 12.3* 12.3*  HCT 39.5 39.0 35.8* 38.1* 36.1*  MCV 87.4 86.9 87.7 89.2 88.7  PLT 130* 151 162 202 219    Basic Metabolic Panel: Recent Labs  Lab 06/04/23 1315 06/07/23 0909 06/08/23 0546 06/09/23 0841 06/10/23 0610  NA 130* 129* 132* 134* 136  K 3.5 3.5 3.2* 3.8 4.2  CL 92* 91* 98 99 97*  CO2 30 27 25 24 29   GLUCOSE 211* 143* 119* 193* 124*  BUN 22 17 15 17 21   CREATININE 1.29* 1.11 0.95 0.94 1.01  CALCIUM  8.7* 8.4* 7.9* 8.4* 8.5*  MG  --   --   --  2.2 2.3  PHOS  --   --   --  2.8 3.3   GFR: Estimated Creatinine Clearance: 59.3 mL/min (by C-G formula based on SCr of 1.01 mg/dL). Recent Labs  Lab 06/07/23 0909 06/08/23 0546 06/09/23 0841 06/10/23 0610  WBC 6.4 5.4 7.2 7.9    Liver Function Tests: Recent Labs  Lab 06/04/23 1315 06/07/23 0909 06/08/23 0546 06/09/23 0841 06/10/23 0610  AST 56* 49* 41 47* 51*  ALT 44 36 30 32 30  ALKPHOS 584* 456* 370* 383* 378*  BILITOT 1.3* 1.5* 1.3* 1.0 1.1  PROT 7.0 6.5 5.8* 6.1* 6.1*  ALBUMIN 3.6 2.8* 2.4* 2.5* 2.5*   Recent Labs  Lab 06/07/23 0909  LIPASE 38   No results for input(s): AMMONIA in the last 168 hours.  ABG No results found for: PHART, PCO2ART, PO2ART, HCO3, TCO2, ACIDBASEDEF, O2SAT   Coagulation Profile: No results for input(s): INR, PROTIME in the last 168 hours.  Cardiac Enzymes: No results for input(s): CKTOTAL, CKMB, CKMBINDEX, TROPONINI in the last 168 hours.  HbA1C: No results found for: HGBA1C  CBG: No results for input(s): GLUCAP in the last 168 hours.  Review of Systems:   As per HPI  Past Medical History:  He,  has a past medical history of Basal cell carcinoma of auricle of right ear, BPH (benign prostatic hyperplasia), DJD (degenerative joint disease), ED (erectile dysfunction), Fatigue, Hearing loss,  sensorineural, adenomatous colonic polyps, major depression, Hypertension, Iron deficiency anemia, Nephrolithiasis, Paroxysmal atrial fibrillation (HCC), and Seasonal allergic rhinitis.   Surgical History:   Past Surgical History:  Procedure Laterality Date   CARDIOVERSION N/A 08/13/2015   Procedure: CARDIOVERSION;  Surgeon: Vinie JAYSON Maxcy, MD;  Location: [facility] ENDOSCOPY;  Service: Cardiovascular;  Laterality: N/A;   CARDIOVERSION N/A 04/04/2016   Procedure: CARDIOVERSION;  Surgeon: Wilbert JONELLE Bihari, MD;  Location: [facility] ENDOSCOPY;  Service: Cardiovascular;  Laterality: N/A;   CARDIOVERSION N/A 11/12/2017   Procedure: CARDIOVERSION;  Surgeon: Maxcy Vinie JAYSON, MD;  Location: [facility] ENDOSCOPY;  Service: Cardiovascular;  Laterality: N/A;   CHOLECYSTECTOMY     colonscopy     herniorraphy Bilateral    had surgery x2 on the left  and right   TONSILLECTOMY AND ADENOIDECTOMY       Social History:   reports that he quit smoking about 62 years ago. His smoking use included cigarettes. He started smoking about 64 years ago. He has a 1.5 pack-year smoking history. He has never used smokeless tobacco. He reports that he does not drink  alcohol  and does not use drugs.   Family History:  His family history includes Alcoholism in his daughter; Arrhythmia in his daughter; CAD in his mother; Cancer - Prostate in his brother; Healthy in his daughter and son; Heart attack in his father; Heart disease in his mother.   Allergies No Known Allergies   Home Medications  Prior to Admission medications   Medication Sig Start Date End Date Taking? Authorizing Provider  amLODipine  (NORVASC ) 2.5 MG tablet Take 1 tablet (2.5 mg total) by mouth daily. 09/18/22  Yes Hilty, Vinie BROCKS, MD  apixaban  (ELIQUIS ) 5 MG TABS tablet Take 5 mg by mouth 2 (two) times daily. 07/14/15  Yes [provider]  atorvastatin  (LIPITOR) 20 MG tablet Take 20 mg by mouth daily.   Yes [provider]  buPROPion  (WELLBUTRIN  XL) 150 MG 24  hr tablet Take 150 mg by mouth every morning. 05/13/23  Yes [provider]  busPIRone  (BUSPAR ) 5 MG tablet Take 5 mg by mouth at bedtime. 02/05/23  Yes [provider]  flecainide  (TAMBOCOR ) 100 MG tablet Take 1 tablet (100 mg total) by mouth 2 (two) times daily. 04/12/23  Yes Hilty, Vinie BROCKS, MD  furosemide  (LASIX ) 40 MG tablet Take 1 tablet (40 mg total) by mouth daily. 06/05/23 09/03/23 Yes Hilty, Vinie BROCKS, MD  mirtazapine  (REMERON ) 15 MG tablet Take 15 mg by mouth at bedtime. 04/02/23  Yes [provider]  omeprazole  (PRILOSEC) 20 MG capsule Take 1 capsule by mouth once daily Patient taking differently: Take 20 mg by mouth daily before breakfast. 05/29/23  Yes Lanny Callander, MD  TYLENOL  500 MG tablet Take 500-1,000 mg by mouth every 6 (six) hours as needed for headache, mild pain (pain score 1-3) or fever.   Yes [provider]  valsartan -hydrochlorothiazide  (DIOVAN -HCT) 320-12.5 MG tablet Take 1 tablet by mouth daily. 03/06/23  Yes [provider]  vitamin B-12 (CYANOCOBALAMIN ) 500 MCG tablet Take 500 mcg by mouth 3 (three) times a week.   Yes [provider]  traMADol  (ULTRAM ) 50 MG tablet Take 1 tablet (50 mg total) by mouth every 6 (six) hours as needed. Patient not taking: Reported on 06/07/2023 04/25/23   Lanny Callander, MD     Critical care time: NA     Lamar Chris, MD, PhD 06/10/2023, 4:49 PM [facility] [phone] or if no answer before 7:00PM call [phone] For any issues after 7:00PM please call eLink [phone]

## 2023-06-10 NOTE — Progress Notes (Signed)
 PROGRESS NOTE    Corey [name]  FMW:[id number] DOB: [birth year] DOA: 06/07/2023 PCP: Charlott Dorn LABOR, MD   Brief Narrative:  The patient is a 89 year old Caucasian male with a past medical history significant for but not limited to paroxysmal atrial fibrillation on anticoagulation with Eliquis , history of BPH, recently diagnosed neuroendocrine liver tumors with unknown primary being admitted to the hospital with weakness and shortness of breath.  Patient was noted to be hypoxic and had acute respiratory failure and most of the history is provided by his daughter and his wife and unfortunately the patient had been recently diagnosed with stage IV metastatic neuroendocrine tumor of unknown primary in November 2024.  Since that time he was started on Afinitor  but was unable to tolerate it due to significant fatigue and cough.  He was awaiting consultation with Dr. Malva for Lutathera Therapy and was supposed to have consultation with him yesterday but given his weakness and shortness of breath he came to the hospital.  He was recently seen by his oncologist on Monday and he had developed a cough which was dry and so the oncologist ordered a chest x-ray which revealed pneumonia.  He was then seen by his cardiologist on 06/05/2023 who felt that based on his presentation the patient may be a heart failure so he was started on Lasix .  He had a good response to Lasix  and his poor oral edema resolved however his cough and dyspnea continued.  Given this he presented to the ED and further workup showed evidence of progressive pulmonary consolidation.  He was initiated on IV antibiotics with empiric azithromycin  and Rocephin  and admitted and currently repeat chest x-ray done and showed Interval worsening multifocal left lung pneumonia. Low lung volumes with right lung involvement not fully excluded. Followup PA and lateral chest X-ray is recommended in 3-4 weeks following therapy to ensure resolution. Aortic  Atherosclerosis and Emphysema  His breathing treatments have been adjusted and we have added guaifenesin , flutter valve and incentive spirometry.  Will continue current plan of care for now and if he is not improving we will obtain a CT scan of the chest without contrast in the a.m. and potentially add steroids.  Assessment and Plan:  Acute Respiratory Failure with Hypoxia in the setting of CAP vs Pneumonitis vs other including Acute on Chronic CHF -Presented with acute respiratory failure with hypoxia and had a reported pulse oximetry of 85% on room air at home -Most likely etiology of his hypoxia due to community-acquired pneumonia and PE was considered in the setting of his malignancy but is unlikely given that the patient is already anticoagulated Eliquis  and has no pleuritic chest pain and had no evidence of tachycardia on admission however given his decompensation will obtain CTA PE Protocol today SpO2: 91 % O2 Flow Rate (L/min): 10 L/min FiO2 (%): (!) 3 % -Continuous pulse oximetry and maintain O2 saturations greater than 90% -Will add incentive spirometry, guaifenesin  1200 mg p.o. twice daily and flutter valve -Patient decompensated today from a respiratory standpoint and was found to be saturating at 75% with 2 L of supplemental oxygen so had to be titrated up to 10 L salter.  Will get a CTA PE protocol and give the patient a dose of Solu-Medrol  1.5 mg x 1 and also call pulmonary for further evaluation -Given that the patient has been on everolimus  for about 3 weeks it can also potentially cause noninfectious pneumonitis and give a dose of IV Solu-Medrol  and a dose of IV  Lasix  -Also added Xopenex /Atrovent  every 6 scheduled -Supplemental oxygen via nasal cannula wean O2 as tolerated -Will need an ambulatory home O2 screen and repeat chest x-ray prior to discharge -Will need to follow-up on pulmonary recommendations given his decompensation  ? Bacteremia -1/4 showed GPC and likely a  contaminant and this was Staph hominis -Repeat Blood Cx x2 done and showed NGTD at 2 days -C/w Abx as above  Community-Acquired Pneumonia -Has had progressive cough, hypoxia, multilobar pneumonia seen on chest x-ray and has been progressive when compared to x-ray from 06/04/2023.  No fever, leukocytosis, or other evidence of sepsis. -Observation admission but will change to Inpatient  -Continue supplemental oxygen if needed -C/w Empiric IV azithromycin  and IV Rocephin  for now -Incentive spirometer, and flutter valve and Guaifenesin  -Treatment as above -Repeat CXR yesterday AM done and showed Interval worsening multifocal left lung pneumonia. Low lung volumes with right lung involvement not fully excluded. Followup PA and lateral chest X-ray is recommended in 3-4 weeks following therapy to ensure resolution. Aortic atherosclerosis and Emphysema. -Chest x-ray today yesterday and showed Further radiographic progression of multilobar Left lung pneumonia. Comparatively mild right lung involvement. No pleural effusion. -Given respiratory decompensation will obtain CTA PE Protocol and consult Pulmonary and give a dose of IV Solumedrol 125 mg x1 and a dose of IV Lasix  today   Chronic Diastolic CHF -he had responded well to several days of p.o. Lasix , currently has no evidence of fluid overload. -Not appear overtly volume overloaded will give a dose of IV Lasix  40 mg given his respiratory disc decompensation -Strict I's an O's and Daily Weights No intake or output data in the 24 hours ending 06/10/23 1710 -ECHOCardiogram done and showed an EF of 50 to 55% with a LVEF having low normal function with no regional wall motion abnormalities in the left ventricular diastolic parameters consistent with grade 1 diastolic dysfunction with a right ventricular systolic function also being moderately reduced -Follows with Cardiology and will need outpatient follow up but if necessary will obtain Inpatient  Consultation.    Stage IV Neuroendocrine Tumor -Notified Dr. Lanny as a courtesy and she evaluated and will follow-up with him on Monday and recommend continue antibiotics for now -Patient is status post first-line Everolimus  -Will need outpatient follow-up with Dr. Lanny and Dr. Malva as previously planned   Paroxysmal Atrial Fibrillation -Continue to Monitor on Telemetry  -Continue Flecainide  100 mg po BID and Anticoagulation with Apixaban  5 mg po BID    Hypertension -Continue home ARB with Irbesartan  300 mg po Daily -Continue to Monitor BP per Protocol -Last BP Reading was 115/60   Depression and Anxiety -C/w Buproprion 150 mg po Daily   Normocytic Anemia -Hgb/Hct Trend: Recent Labs  Lab 06/04/23 1315 06/07/23 0909 06/08/23 0546 06/09/23 0841 06/10/23 0610  HGB 13.5 13.5 12.0* 12.3* 12.3*  HCT 39.5 39.0 35.8* 38.1* 36.1*  -Checked Anemia Panel showed an iron level of 30, UIBC 258, TIBC 288, saturation history of 10%, ferritin level of 84, folate of 4.9 and vitamin B12 level of 888 -Will start Folic Acid  1 mg po Daily  -Continue to Monitor for S/Sx of Bleeding; No overt bleeding noted -Repeat CBC in the AM  Hyponatremia, improving slowly  -In the setting of Diuretics and suspected to be hypovolemic hyponatremia due to his recent reduced oral intake as well as diuretic usage -Currently holding further diuresis as well as his home HCTZ and he was given a 1 L IV fluid bolus in the ED  and will hold off further fluids -Na+ Trend: Recent Labs  Lab 06/04/23 1315 06/07/23 0909 06/08/23 0546 06/09/23 0841 06/10/23 0610  NA 130* 129* 132* 134* 136  -Continue to Monitor and Trend and repeat CMP in the AM   Hypokalemia -Patient's K+ Level Trend: Recent Labs  Lab 06/04/23 1315 06/07/23 0909 06/08/23 0546 06/09/23 0841 06/10/23 0610  K 3.5 3.5 3.2* 3.8 4.2  -Continue to Monitor and Replete as Necessary -Repeat CMP in the AM   HLD -C/w Atorvastatin  20 mg p.o. daily  but may need to hold given mildly abnormal AST  Hyperbilirubinemia, improved  -Mild and Likely Reactive -Bilirubin Trend: Recent Labs  Lab 06/04/23 1315 06/07/23 0909 06/08/23 0546 06/09/23 0841 06/10/23 0610  BILITOT 1.3* 1.5* 1.3* 1.0 1.1  -Continue to Monitor and Trend and repeat CMP in the AM   GERD/GI Prophylaxis -C/w PPI with Pantoprazole  40 mg Daily   Hypoalbuminemia -Patient's Albumin Trend: Recent Labs  Lab 06/04/23 1315 06/07/23 0909 06/08/23 0546 06/09/23 0841 06/10/23 0610  ALBUMIN 3.6 2.8* 2.4* 2.5* 2.5*  -Continue to Monitor and Trend and repeat CMP in the AM   DVT prophylaxis: SCDs Start: 06/07/23 1231 apixaban  (ELIQUIS ) tablet 5 mg    Code Status: Full Code Family Communication: No family present at beside  Disposition Plan:  Level of care: Telemetry Status is: Inpatient Remains inpatient appropriate because: Needs further clinical Improvement in his Respiratory Status   Consultants:  Pulmonary  Medical Oncology   Procedures:  ECHOCARDIOGRAM IMPRESSIONS     1. Left ventricular ejection fraction, by estimation, is 50 to 55%. Left  ventricular ejection fraction by 2D MOD biplane is 50.5 %. The left  ventricle has low normal function. The left ventricle has no regional wall  motion abnormalities. Left  ventricular diastolic parameters are consistent with Grade I diastolic  dysfunction (impaired relaxation).   2. Right ventricular systolic function is mildly reduced. The right  ventricular size is normal.   3. The mitral valve is grossly normal. Trivial mitral valve  regurgitation.   4. The aortic valve is tricuspid. Aortic valve regurgitation is not  visualized. Aortic valve sclerosis/calcification is present, without any  evidence of aortic stenosis.   5. The inferior vena cava is normal in size with greater than 50%  respiratory variability, suggesting right atrial pressure of 3 mmHg.   Comparison(s): Prior images unable to be directly  viewed, comparison made  by report only. Changes from prior study are noted. 07/28/2015: LVEF 50-55%.   FINDINGS   Left Ventricle: Left ventricular ejection fraction, by estimation, is 50  to 55%. Left ventricular ejection fraction by 2D MOD biplane is 50.5 %.  The left ventricle has low normal function. The left ventricle has no  regional wall motion abnormalities.  The left ventricular internal cavity size was normal in size. There is no  left ventricular hypertrophy. Left ventricular diastolic parameters are  consistent with Grade I diastolic dysfunction (impaired relaxation).  Indeterminate filling pressures.   Right Ventricle: The right ventricular size is normal. No increase in  right ventricular wall thickness. Right ventricular systolic function is  mildly reduced.   Left Atrium: Left atrial size was normal in size.   Right Atrium: Right atrial size was normal in size.   Pericardium: There is no evidence of pericardial effusion.   Mitral Valve: The mitral valve is grossly normal. Trivial mitral valve  regurgitation.   Tricuspid Valve: The tricuspid valve is grossly normal. Tricuspid valve  regurgitation is trivial.  Aortic Valve: The aortic valve is tricuspid. Aortic valve regurgitation is  not visualized. Aortic valve sclerosis/calcification is present, without  any evidence of aortic stenosis. Aortic valve mean gradient measures 5.0  mmHg. Aortic valve peak gradient  measures 8.4 mmHg. Aortic valve area, by VTI measures 1.91 cm.   Pulmonic Valve: The pulmonic valve was normal in structure. Pulmonic valve  regurgitation is not visualized.   Aorta: The aortic root and ascending aorta are structurally normal, with  no evidence of dilitation.   Venous: The inferior vena cava is normal in size with greater than 50%  respiratory variability, suggesting right atrial pressure of 3 mmHg.   IAS/Shunts: No atrial level shunt detected by color flow Doppler.     LEFT  VENTRICLE  PLAX 2D                        Biplane EF (MOD)  LVIDd:         5.30 cm         LV Biplane EF:   Left  LVIDs:         3.70 cm                          ventricular  LV PW:         1.00 cm                          ejection  LV IVS:        0.80 cm                          fraction by  LVOT diam:     2.00 cm                          2D MOD  LV SV:         57                               biplane is  LV SV Index:   27                               50.5 %.  LVOT Area:     3.14 cm                                 Diastology                                 LV e' medial:    5.98 cm/s  LV Volumes (MOD)               LV E/e' medial:  8.0  LV vol d, MOD    129.2 ml      LV e' lateral:   11.60 cm/s  A2C:                           LV E/e' lateral: 4.1  LV vol d, MOD    125.8 ml  A4C:  LV vol s, MOD    64.1  ml  A2C:  LV vol s, MOD    58.0 ml  A4C:  LV SV MOD A2C:   65.2 ml  LV SV MOD A4C:   125.8 ml  LV SV MOD BP:    56.6 ml   RIGHT VENTRICLE  RV Basal diam:  3.40 cm  RV S prime:     7.94 cm/s  TAPSE (M-mode): 1.6 cm   LEFT ATRIUM             Index        RIGHT ATRIUM           Index  LA diam:        3.40 cm 1.62 cm/m   RA Area:     14.10 cm  LA Vol (A2C):   67.0 ml 31.88 ml/m  RA Volume:   29.60 ml  14.09 ml/m  LA Vol (A4C):   37.2 ml 17.70 ml/m  LA Biplane Vol: 48.8 ml 23.22 ml/m   AORTIC VALVE  AV Area (Vmax):    2.16 cm  AV Area (Vmean):   1.90 cm  AV Area (VTI):     1.91 cm  AV Vmax:           145.00 cm/s  AV Vmean:          102.000 cm/s  AV VTI:            0.296 m  AV Peak Grad:      8.4 mmHg  AV Mean Grad:      5.0 mmHg  LVOT Vmax:         99.90 cm/s  LVOT Vmean:        61.600 cm/s  LVOT VTI:          0.180 m  LVOT/AV VTI ratio: 0.61    AORTA  Ao Root diam: 3.40 cm   MITRAL VALVE  MV Area (PHT): 2.34 cm    SHUNTS  MV Decel Time: 324 msec    Systemic VTI:  0.18 m  MV E velocity: 47.60 cm/s  Systemic Diam: 2.00 cm  MV A velocity: 74.10 cm/s  MV E/A  ratio:  0.64   Antimicrobials:  Anti-infectives (From admission, onward)    Start     Dose/Rate Route Frequency Ordered Stop   06/10/23 1000  azithromycin  (ZITHROMAX ) tablet 500 mg        500 mg Oral Daily 06/09/23 1252 06/12/23 0959   06/08/23 1200  cefTRIAXone  (ROCEPHIN ) 2 g in sodium chloride  0.9 % 100 mL IVPB        2 g 200 mL/hr over 30 Minutes Intravenous Every 24 hours 06/07/23 1232     06/08/23 1100  azithromycin  (ZITHROMAX ) 500 mg in sodium chloride  0.9 % 250 mL IVPB  Status:  Discontinued        500 mg 250 mL/hr over 60 Minutes Intravenous Every 24 hours 06/07/23 1232 06/09/23 1252   06/07/23 1115  cefTRIAXone  (ROCEPHIN ) 1 g in sodium chloride  0.9 % 100 mL IVPB        1 g 200 mL/hr over 30 Minutes Intravenous  Once 06/07/23 1103 06/07/23 1353   06/07/23 1115  azithromycin  (ZITHROMAX ) 500 mg in sodium chloride  0.9 % 250 mL IVPB        500 mg 250 mL/hr over 60 Minutes Intravenous  Once 06/07/23 1103 06/07/23 1522       Subjective: Seen and examined at bedside was doing okay this afternoon sitting in a chair and thinks his  respiratory status is doing little bit better and subsequently he decompensated in the afternoon and respiratory found him saturating 75% on 2 L so they brought him back to 10 L high flow nasal cannula salter.  A CTA of the chest PE protocol has been ordered and he is given a dose of IV Lasix  and Solu-Medrol .  Pulmonary's been consulted.  Patient denies any nausea or vomiting.  Denied any chest pain but feels extremely weak.  Objective: Vitals:   06/10/23 0645 06/10/23 1215 06/10/23 1408 06/10/23 1438  BP:  115/60    Pulse:  75    Resp:  17    Temp:  97.8 F (36.6 C)    TempSrc:      SpO2: 92% (!) 87% 92% 91%  Weight:      Height:       No intake or output data in the 24 hours ending 06/10/23 1715 Filed Weights   06/07/23 1628  Weight: 86.7 kg   Examination this AM: Physical Exam:  Constitutional: Slightly overweight elderly chronically  appearing Caucasian male in no acute distress Respiratory: Diminished to auscultation bilaterally with some coarse breath sounds and has some crackles and some rhonchi but no appreciable wheezing.  Has a normal respiratory effort and is not tachypneic.  Wearing supplemental oxygen via nasal cannula Cardiovascular: RRR, no murmurs / rubs / gallops. S1 and S2 auscultated.  Mild extremity edema. 2+ pedal pulses.  Abdomen: Soft, non-tender, slightly distended secondary by habitus. Bowel sounds positive.  GU: Deferred. Musculoskeletal: No clubbing / cyanosis of digits/nails. No joint deformity in the upper and lower extremities. Skin: No rashes, lesions, ulcers on the skin evaluation. No induration; Warm and dry.  Neurologic: CN 2-12 grossly intact with no focal deficits. Romberg sign and cerebellar reflexes not assessed.  Psychiatric: Normal judgment and insight. Alert and oriented x 3. Normal mood and appropriate affect.   Data Reviewed: I have personally reviewed following labs and imaging studies  CBC: Recent Labs  Lab 06/04/23 1315 06/07/23 0909 06/08/23 0546 06/09/23 0841 06/10/23 0610  WBC 5.3 6.4 5.4 7.2 7.9  NEUTROABS 3.8 4.9  --  5.1 5.5  HGB 13.5 13.5 12.0* 12.3* 12.3*  HCT 39.5 39.0 35.8* 38.1* 36.1*  MCV 87.4 86.9 87.7 89.2 88.7  PLT 130* 151 162 202 219   Basic Metabolic Panel: Recent Labs  Lab 06/04/23 1315 06/07/23 0909 06/08/23 0546 06/09/23 0841 06/10/23 0610  NA 130* 129* 132* 134* 136  K 3.5 3.5 3.2* 3.8 4.2  CL 92* 91* 98 99 97*  CO2 30 27 25 24 29   GLUCOSE 211* 143* 119* 193* 124*  BUN 22 17 15 17 21   CREATININE 1.29* 1.11 0.95 0.94 1.01  CALCIUM  8.7* 8.4* 7.9* 8.4* 8.5*  MG  --   --   --  2.2 2.3  PHOS  --   --   --  2.8 3.3   GFR: Estimated Creatinine Clearance: 59.3 mL/min (by C-G formula based on SCr of 1.01 mg/dL). Liver Function Tests: Recent Labs  Lab 06/04/23 1315 06/07/23 0909 06/08/23 0546 06/09/23 0841 06/10/23 0610  AST 56* 49* 41  47* 51*  ALT 44 36 30 32 30  ALKPHOS 584* 456* 370* 383* 378*  BILITOT 1.3* 1.5* 1.3* 1.0 1.1  PROT 7.0 6.5 5.8* 6.1* 6.1*  ALBUMIN 3.6 2.8* 2.4* 2.5* 2.5*   Recent Labs  Lab 06/07/23 0909  LIPASE 38   No results for input(s): AMMONIA in the last 168 hours. Coagulation Profile: No  results for input(s): INR, PROTIME in the last 168 hours. Cardiac Enzymes: No results for input(s): CKTOTAL, CKMB, CKMBINDEX, TROPONINI in the last 168 hours. BNP (last 3 results) No results for input(s): PROBNP in the last 8760 hours. HbA1C: No results for input(s): HGBA1C in the last 72 hours. CBG: No results for input(s): GLUCAP in the last 168 hours. Lipid Profile: No results for input(s): CHOL, HDL, LDLCALC, TRIG, CHOLHDL, LDLDIRECT in the last 72 hours. Thyroid  Function Tests: No results for input(s): TSH, T4TOTAL, FREET4, T3FREE, THYROIDAB in the last 72 hours. Anemia Panel: Recent Labs    06/10/23 0610  VITAMINB12 888  FOLATE 4.9*  FERRITIN 84  TIBC 288  IRON 30*  RETICCTPCT 1.3   Sepsis Labs: No results for input(s): PROCALCITON, LATICACIDVEN in the last 168 hours.  Recent Results (from the past 240 hours)  Culture, blood (routine x 2)     Status: None (Preliminary result)   Collection Time: 06/07/23 11:58 AM   Specimen: BLOOD  Result Value Ref Range Status   Specimen Description   Final    BLOOD LEFT ANTECUBITAL Performed at [facility], [address] [place], [place] [address]    Special Requests   Final    BOTTLES DRAWN AEROBIC AND ANAEROBIC Blood Culture adequate volume Performed at [facility], [address], [place], [place] [address]    Culture   Final    NO GROWTH 3 DAYS Performed at [facility], [address] [place], [place] [address]    Report Status PENDING  Incomplete  Culture, blood (routine x 2)     Status: Abnormal   Collection Time: 06/07/23 11:58 AM    Specimen: BLOOD  Result Value Ref Range Status   Specimen Description   Final    BLOOD RIGHT ANTECUBITAL Performed at [facility], [address], [place], [place] [address]    Special Requests   Final    BOTTLES DRAWN AEROBIC AND ANAEROBIC Blood Culture adequate volume Performed at [facility], [address] [place], [place] [address]    Culture  Setup Time   Final    GRAM POSITIVE COCCI IN BOTH AEROBIC AND ANAEROBIC BOTTLES CRITICAL RESULT CALLED TO, READ BACK BY AND VERIFIED WITH: PHARMD SYDNEY, D [id number] FCP    Culture (A)  Final    STAPHYLOCOCCUS HOMINIS THE SIGNIFICANCE OF ISOLATING THIS ORGANISM FROM A SINGLE SET OF BLOOD CULTURES WHEN MULTIPLE SETS ARE DRAWN IS UNCERTAIN. PLEASE NOTIFY THE MICROBIOLOGY DEPARTMENT WITHIN ONE WEEK IF SPECIATION AND SENSITIVITIES ARE REQUIRED. Performed at [facility], [address], [place], [place] [address]    Report Status 06/09/2023 FINAL  Final  Blood Culture ID Panel (Reflexed)     Status: Abnormal   Collection Time: 06/07/23 11:58 AM  Result Value Ref Range Status   Enterococcus faecalis NOT DETECTED NOT DETECTED Final   Enterococcus Faecium NOT DETECTED NOT DETECTED Final   Listeria monocytogenes NOT DETECTED NOT DETECTED Final   Staphylococcus species DETECTED (A) NOT DETECTED Final    Comment: CRITICAL RESULT CALLED TO, READ BACK BY AND VERIFIED WITH: PHARMD SYDNEY, D [phone] FCP    Staphylococcus aureus (BCID) NOT DETECTED NOT DETECTED Final   Staphylococcus epidermidis NOT DETECTED NOT DETECTED Final   Staphylococcus lugdunensis NOT DETECTED NOT DETECTED Final   Streptococcus species NOT DETECTED NOT DETECTED Final   Streptococcus agalactiae NOT DETECTED NOT DETECTED Final   Streptococcus pneumoniae NOT DETECTED NOT  DETECTED Final   Streptococcus pyogenes NOT DETECTED NOT DETECTED Final   A.calcoaceticus-baumannii NOT DETECTED NOT DETECTED Final   Bacteroides fragilis  NOT DETECTED NOT DETECTED Final   Enterobacterales NOT DETECTED NOT DETECTED Final   Enterobacter cloacae complex NOT DETECTED NOT DETECTED Final   Escherichia coli NOT DETECTED NOT DETECTED Final   Klebsiella aerogenes NOT DETECTED NOT DETECTED Final   Klebsiella oxytoca NOT DETECTED NOT DETECTED Final   Klebsiella pneumoniae NOT DETECTED NOT DETECTED Final   Proteus species NOT DETECTED NOT DETECTED Final   Salmonella species NOT DETECTED NOT DETECTED Final   Serratia marcescens NOT DETECTED NOT DETECTED Final   Haemophilus influenzae NOT DETECTED NOT DETECTED Final   Neisseria meningitidis NOT DETECTED NOT DETECTED Final   Pseudomonas aeruginosa NOT DETECTED NOT DETECTED Final   Stenotrophomonas maltophilia NOT DETECTED NOT DETECTED Final   Candida albicans NOT DETECTED NOT DETECTED Final   Candida auris NOT DETECTED NOT DETECTED Final   Candida glabrata NOT DETECTED NOT DETECTED Final   Candida krusei NOT DETECTED NOT DETECTED Final   Candida parapsilosis NOT DETECTED NOT DETECTED Final   Candida tropicalis NOT DETECTED NOT DETECTED Final   Cryptococcus neoformans/gattii NOT DETECTED NOT DETECTED Final    Comment: Performed at [facility], [address], [place], [place] [address]  Culture, blood (Routine X 2) w Reflex to ID Panel     Status: None (Preliminary result)   Collection Time: 06/08/23 10:12 AM   Specimen: BLOOD LEFT HAND  Result Value Ref Range Status   Specimen Description   Final    BLOOD LEFT HAND Performed at [facility], [address] [place], [place] [address]    Special Requests   Final    BOTTLES DRAWN AEROBIC ONLY Blood Culture results may not be optimal due to an inadequate volume of blood received in culture bottles Performed at [facility], [address] [place], [place] [address]    Culture   Final    NO GROWTH 2 DAYS Performed at [facility], [address], [place], [place] [address]    Report Status  PENDING  Incomplete  Culture, blood (Routine X 2) w Reflex to ID Panel     Status: None (Preliminary result)   Collection Time: 06/08/23 10:12 AM   Specimen: BLOOD RIGHT HAND  Result Value Ref Range Status   Specimen Description   Final    BLOOD RIGHT HAND Performed at [facility], [address], [place], [place] [address]    Special Requests   Final    BOTTLES DRAWN AEROBIC ONLY Blood Culture results may not be optimal due to an inadequate volume of blood received in culture bottles Performed at [facility], [address], [place], [place] [address]    Culture   Final    NO GROWTH 2 DAYS Performed at [facility], [address], [place], [place] [address]    Report Status PENDING  Incomplete  Respiratory (~20 pathogens) panel by PCR     Status: None   Collection Time: 06/08/23  6:34 PM   Specimen: Nasopharyngeal Swab; Respiratory  Result Value Ref Range Status   Adenovirus NOT DETECTED NOT DETECTED Final   Coronavirus 229E NOT DETECTED NOT DETECTED Final    Comment: (NOTE) The Coronavirus on the Respiratory Panel, DOES NOT test for the novel  Coronavirus (2019 nCoV)    Coronavirus HKU1 NOT DETECTED NOT DETECTED Final  Coronavirus NL63 NOT DETECTED NOT DETECTED Final   Coronavirus OC43 NOT DETECTED NOT DETECTED Final   Metapneumovirus NOT DETECTED NOT DETECTED Final   Rhinovirus / Enterovirus NOT DETECTED NOT DETECTED Final   Influenza A NOT DETECTED NOT DETECTED Final   Influenza B NOT DETECTED NOT DETECTED Final   Parainfluenza Virus 1 NOT DETECTED NOT DETECTED Final   Parainfluenza Virus 2 NOT DETECTED NOT DETECTED Final   Parainfluenza Virus 3 NOT DETECTED NOT DETECTED Final   Parainfluenza Virus 4 NOT DETECTED NOT DETECTED Final   Respiratory Syncytial Virus NOT DETECTED NOT DETECTED Final   Bordetella pertussis NOT DETECTED NOT DETECTED Final   Bordetella Parapertussis NOT DETECTED NOT DETECTED Final   Chlamydophila pneumoniae NOT  DETECTED NOT DETECTED Final   Mycoplasma pneumoniae NOT DETECTED NOT DETECTED Final    Comment: Performed at [facility], [address], [place], [place] [address]  SARS Coronavirus 2 by RT PCR (hospital order, performed in [facility] hospital lab) *cepheid single result test* Anterior Nasal Swab     Status: None   Collection Time: 06/09/23  7:25 PM   Specimen: Anterior Nasal Swab  Result Value Ref Range Status   SARS Coronavirus 2 by RT PCR NEGATIVE NEGATIVE Final    Comment: (NOTE) SARS-CoV-2 target nucleic acids are NOT DETECTED.  The SARS-CoV-2 RNA is generally detectable in upper and lower respiratory specimens during the acute phase of infection. The lowest concentration of SARS-CoV-2 viral copies this assay can detect is 250 copies / mL. A negative result does not preclude SARS-CoV-2 infection and should not be used as the sole basis for treatment or other patient management decisions.  A negative result may occur with improper specimen collection / handling, submission of specimen other than nasopharyngeal swab, presence of viral mutation(s) within the areas targeted by this assay, and inadequate number of viral copies (<250 copies / mL). A negative result must be combined with clinical observations, patient history, and epidemiological information.  Fact Sheet for Patients:   roadlaptop.co.za  Fact Sheet for Healthcare Providers: http://kim-miller.com/  This test is not yet approved or  cleared by the [place]  FDA and has been authorized for detection and/or diagnosis of SARS-CoV-2 by FDA under an Emergency Use Authorization (EUA).  This EUA will remain in effect (meaning this test can be used) for the duration of the COVID-19 declaration under Section 564(b)(1) of the Act, 21 U.S.C. section 360bbb-3(b)(1), unless the authorization is terminated or revoked sooner.  Performed at [facility], [address] [place], [place] [address]     Radiology Studies: CT Angio Chest Pulmonary Embolism (PE) W or WO Contrast Result Date: 06/10/2023 CLINICAL DATA:  Chronic dyspnea, neuroendocrine tumor with hepatic metastatic disease. * Tracking Code: BO * EXAM: CT ANGIOGRAPHY CHEST WITH CONTRAST TECHNIQUE: Multidetector CT imaging of the chest was performed using the standard protocol during bolus administration of intravenous contrast. Multiplanar CT image reconstructions and MIPs were obtained to evaluate the vascular anatomy. RADIATION DOSE REDUCTION: This exam was performed according to the departmental dose-optimization program which includes automated exposure control, adjustment of the mA and/or kV according to patient size and/or use of iterative reconstruction technique. CONTRAST:  75mL OMNIPAQUE  IOHEXOL  350 MG/ML SOLN COMPARISON:  PET-CT 04/16/2023 and CT chest 03/21/2023 FINDINGS: Despite efforts by the technologist and patient, motion artifact is present on today's exam and could not be eliminated. This reduces exam sensitivity and specificity. Cardiovascular: No filling defect is identified in the pulmonary arterial  tree to suggest pulmonary embolus. Coronary, aortic arch, and branch vessel atherosclerotic vascular disease. Mild cardiomegaly. Mediastinum/Nodes: Upper prevascular node 1.1 cm on image 36 series 4, mildly enlarged, previously measuring 0.8 cm. Left supraclavicular node 1.2 cm in short axis on image 30 series 4, formerly 1.0 cm. Mild right paratracheal adenopathy as well. Mildly dilated esophagus with air-fluid level. Lungs/Pleura: Bilateral airspace opacities favoring acute pulmonary edema. Small left pleural effusion. Upper Abdomen: Hepatomegaly. Heterogeneous density probably reflecting metastatic disease. Pseudo cirrhosis contour. Musculoskeletal: No acute bony findings. Review of the MIP images confirms the above findings. IMPRESSION: 1. No filling defect is identified in the  pulmonary arterial tree to suggest pulmonary embolus. 2. Bilateral airspace opacities favoring acute pulmonary edema. Small left pleural effusion. 3. Mild cardiomegaly. 4. Hepatomegaly with heterogeneous density probably reflecting metastatic disease. Pseudocirrhosis contour. 5. Mildly enlarged mediastinal and left supraclavicular nodes, mildly increased in size from prior. Congestion as a potential cause. 6. Mildly dilated esophagus with air-fluid level. 7. Aortic atherosclerosis. Aortic Atherosclerosis (ICD10-I70.0). Electronically Signed   By: Ryan Salvage M.D.   On: 06/10/2023 17:07   DG CHEST PORT 1 VIEW Result Date: 06/09/2023 CLINICAL DATA:  89 year old male with shortness of breath. Cough and evidence of left lung pneumonia on radiographs. EXAM: PORTABLE CHEST 1 VIEW COMPARISON:  Portable chest yesterday and earlier. FINDINGS: Portable AP semi upright view at 0602 hours. Progressive confluence of widespread abnormal left lung opacity, especially above the left hilum now. Stable lung volumes. Stable cardiac size and mediastinal contours. Right lung remains less affected, there is mild patchy peripheral right mid lung opacity near the minor fissure. No pleural effusion. No pneumothorax. As is fissure (normal variant). Calcified aortic atherosclerosis. Paucity of bowel gas. Stable visualized osseous structures. IMPRESSION: 1. Further radiographic progression of multilobar Left lung pneumonia. 2. Comparatively mild right lung involvement.  No pleural effusion. Electronically Signed   By: VEAR Hurst M.D.   On: 06/09/2023 08:24   Scheduled Meds:  apixaban   5 mg Oral BID   atorvastatin   20 mg Oral Daily   azithromycin   500 mg Oral Daily   budesonide  (PULMICORT ) nebulizer solution  0.25 mg Nebulization BID   buPROPion   150 mg Oral q morning   busPIRone   5 mg Oral QHS   feeding supplement  237 mL Oral BID BM   flecainide   100 mg Oral BID   folic acid   1 mg Intravenous Daily   furosemide   40 mg  Intravenous Once   guaiFENesin   1,200 mg Oral BID   ipratropium  0.5 mg Nebulization TID   irbesartan   300 mg Oral Daily   levalbuterol   0.63 mg Nebulization TID   mirtazapine   15 mg Oral QHS   pantoprazole   40 mg Oral Daily   Continuous Infusions:  cefTRIAXone  (ROCEPHIN )  IV 2 g (06/10/23 1107)    LOS: 2 days   Alejandro Marker, DO [facility] Available via Epic secure chat 7am-7pm After these hours, please refer to coverage provider listed on [email or web address] 06/10/2023, 5:15 PM

## 2023-06-10 NOTE — Progress Notes (Signed)
 Came to room to give patients scheduled breathing tx. O2 saturations were 75% on 2L. Gave nebulizer. Placed pt on 10L salter high flow. Pts saturations improved to 92%. Notified both patients nurse and Md.

## 2023-06-11 ENCOUNTER — Inpatient Hospital Stay (HOSPITAL_COMMUNITY): Payer: Medicare Other

## 2023-06-11 DIAGNOSIS — D3A8 Other benign neuroendocrine tumors: Secondary | ICD-10-CM | POA: Diagnosis not present

## 2023-06-11 DIAGNOSIS — J9601 Acute respiratory failure with hypoxia: Secondary | ICD-10-CM | POA: Diagnosis not present

## 2023-06-11 DIAGNOSIS — J810 Acute pulmonary edema: Secondary | ICD-10-CM | POA: Diagnosis not present

## 2023-06-11 DIAGNOSIS — J189 Pneumonia, unspecified organism: Secondary | ICD-10-CM | POA: Diagnosis not present

## 2023-06-11 LAB — COMPREHENSIVE METABOLIC PANEL
ALT: 34 U/L (ref 0–44)
AST: 60 U/L — ABNORMAL HIGH (ref 15–41)
Albumin: 2.6 g/dL — ABNORMAL LOW (ref 3.5–5.0)
Alkaline Phosphatase: 411 U/L — ABNORMAL HIGH (ref 38–126)
Anion gap: 14 (ref 5–15)
BUN: 33 mg/dL — ABNORMAL HIGH (ref 8–23)
CO2: 24 mmol/L (ref 22–32)
Calcium: 8.8 mg/dL — ABNORMAL LOW (ref 8.9–10.3)
Chloride: 99 mmol/L (ref 98–111)
Creatinine, Ser: 1.29 mg/dL — ABNORMAL HIGH (ref 0.61–1.24)
GFR, Estimated: 54 mL/min — ABNORMAL LOW (ref >60)
Glucose, Bld: 262 mg/dL — ABNORMAL HIGH (ref 70–99)
Potassium: 4.1 mmol/L (ref 3.5–5.1)
Sodium: 137 mmol/L (ref 135–145)
Total Bilirubin: 1.1 mg/dL (ref 0.0–1.2)
Total Protein: 6.7 g/dL (ref 6.5–8.1)

## 2023-06-11 LAB — CBC WITH DIFFERENTIAL/PLATELET
Abs Immature Granulocytes: 0.01 10*3/uL (ref 0.00–0.07)
Basophils Absolute: 0 10*3/uL (ref 0.0–0.1)
Basophils Relative: 0 %
Eosinophils Absolute: 0 10*3/uL (ref 0.0–0.5)
Eosinophils Relative: 0 %
HCT: 40.6 % (ref 39.0–52.0)
Hemoglobin: 13.2 g/dL (ref 13.0–17.0)
Immature Granulocytes: 0 %
Lymphocytes Relative: 8 %
Lymphs Abs: 0.4 10*3/uL — ABNORMAL LOW (ref 0.7–4.0)
MCH: 28.6 pg (ref 26.0–34.0)
MCHC: 32.5 g/dL (ref 30.0–36.0)
MCV: 87.9 fL (ref 80.0–100.0)
Monocytes Absolute: 0.2 10*3/uL (ref 0.1–1.0)
Monocytes Relative: 3 %
Neutro Abs: 4.8 10*3/uL (ref 1.7–7.7)
Neutrophils Relative %: 89 %
Platelets: 286 10*3/uL (ref 150–400)
RBC: 4.62 MIL/uL (ref 4.22–5.81)
RDW: 13.5 % (ref 11.5–15.5)
WBC: 5.4 10*3/uL (ref 4.0–10.5)
nRBC: 0 % (ref 0.0–0.2)

## 2023-06-11 LAB — PHOSPHORUS: Phosphorus: 5 mg/dL — ABNORMAL HIGH (ref 2.5–4.6)

## 2023-06-11 LAB — BRAIN NATRIURETIC PEPTIDE: B Natriuretic Peptide: 446.9 pg/mL — ABNORMAL HIGH (ref 0.0–100.0)

## 2023-06-11 LAB — MAGNESIUM: Magnesium: 2.5 mg/dL — ABNORMAL HIGH (ref 1.7–2.4)

## 2023-06-11 MED ORDER — FOLIC ACID 1 MG PO TABS
1.0000 mg | ORAL_TABLET | Freq: Every day | ORAL | Status: DC
  Administered 2023-06-12 – 2023-06-18 (×7): 1 mg via ORAL
  Filled 2023-06-11 (×8): qty 1

## 2023-06-11 MED ORDER — FUROSEMIDE 10 MG/ML IJ SOLN
40.0000 mg | Freq: Once | INTRAMUSCULAR | Status: AC
  Administered 2023-06-11: 40 mg via INTRAVENOUS
  Filled 2023-06-11: qty 4

## 2023-06-11 NOTE — Progress Notes (Signed)
 PROGRESS NOTE    Corey [name]  FMW:[id number] DOB: [birth year] DOA: 06/07/2023 PCP: Charlott Dorn LABOR, MD   Brief Narrative:  The patient is a 89 year old Caucasian male with a past medical history significant for but not limited to paroxysmal atrial fibrillation on anticoagulation with Eliquis , history of BPH, recently diagnosed neuroendocrine liver tumors with unknown primary being admitted to the hospital with weakness and shortness of breath.  Patient was noted to be hypoxic and had acute respiratory failure and most of the history is provided by his daughter and his wife and unfortunately the patient had been recently diagnosed with stage IV metastatic neuroendocrine tumor of unknown primary in November 2024.  Since that time he was started on Afinitor  but was unable to tolerate it due to significant fatigue and cough.  He was awaiting consultation with Dr. Malva for Lutathera Therapy and was supposed to have consultation with him yesterday but given his weakness and shortness of breath he came to the hospital.  He was recently seen by his oncologist on Monday and he had developed a cough which was dry and so the oncologist ordered a chest x-ray which revealed pneumonia.  He was then seen by his cardiologist on 06/05/2023 who felt that based on his presentation the patient may be a heart failure so he was started on Lasix .  He had a good response to Lasix  and his poor oral edema resolved however his cough and dyspnea continued.  Given this he presented to the ED and further workup showed evidence of progressive pulmonary consolidation.  He was initiated on IV antibiotics with empiric azithromycin  and Rocephin  and admitted and currently repeat chest x-ray done and showed Interval worsening multifocal left lung pneumonia. Low lung volumes with right lung involvement not fully excluded. Followup PA and lateral chest X-ray is recommended in 3-4 weeks following therapy to ensure resolution. Aortic  Atherosclerosis and Emphysema  His breathing treatments have been adjusted and we have added guaifenesin , flutter valve and incentive spirometry.  Obtained a CT scan of the chest PE protocol showed no evidence of PE but did show possible pulmonary edema.  He was given IV Solu-Medrol  and a dose of IV Lasix  and cardiology has not been consulted.  Pulmonary has now added IV steroids 125 mg every 12 for 5 more doses.  Assessment and Plan:  Acute Respiratory Failure with Hypoxia in the setting of CAP vs Pneumonitis vs other including Acute on Chronic Diastolic CHF -Presented with acute respiratory failure with hypoxia and had a reported pulse oximetry of 85% on room air at home -Most likely etiology of his hypoxia due to community-acquired pneumonia and PE was considered in the setting of his malignancy but is unlikely given that the patient is already anticoagulated Eliquis  and has no pleuritic chest pain and had no evidence of tachycardia on admission however given his decompensation will obtain CTA PE Protocol today SpO2: 90 % O2 Flow Rate (L/min): 10 L/min FiO2 (%): (!) 3 % -Continuous pulse oximetry and maintain O2 saturations greater than 90% -SARS-CoV-2 Negative  -Will add incentive spirometry, guaifenesin  1200 mg p.o. twice daily and flutter valve -Patient decompensated today from a respiratory standpoint and was found to be saturating at 75% with 2 L of supplemental oxygen so had to be titrated up to 10 L salter.  Will get a CTA PE protocol and give the patient a dose of Solu-Medrol  1.5 mg x 1 and also call pulmonary for further evaluation -Given that the patient has been  on everolimus  for about 3 weeks it can also potentially cause noninfectious pneumonitis and give a dose of IV Solu-Medrol  125 mg x1and a dose of IV Lasix  40 mg x1 -Now Pulmonary has started the patient on Solu-Medrol  125 mg every 12 for 5 doses -Also added Xopenex /Atrovent  every 6 scheduled and has now been changed to 3  times daily and also on Budesonide  0.25 mg twice daily -Supplemental oxygen via nasal cannula wean O2 as tolerated -Will need an ambulatory home O2 screen and repeat chest x-ray prior to discharge -Will need to follow-up on pulmonary recommendations given his decompensation  ? Bacteremia -1/4 showed GPC and likely a contaminant and this was Staph hominis -Repeat Blood Cx x2 done and showed NGTD at 2 days -C/w Abx as above  Community-Acquired Pneumonia -Has had progressive cough, hypoxia, multilobar pneumonia seen on chest x-ray and has been progressive when compared to x-ray from 06/04/2023.  No fever, leukocytosis, or other evidence of sepsis. -Observation admission but will change to Inpatient  -Continue supplemental oxygen if needed -C/w Empiric IV azithromycin  and IV Rocephin  for now -Incentive spirometer, and flutter valve and Guaifenesin  -Treatment as above -Chest x-ray today showed No significant interval change in the bilateral pulmonary opacities,left greater than right. No pleural effusion or pneumothorax. Stable cardiac silhouette no acute osseous pathology.. -CTA PE protocol done and showed No filling defect is identified in the pulmonary arterial tree to suggest pulmonary embolus. Bilateral airspace opacities favoring acute pulmonary edema. Small left pleural effusion. Mild cardiomegaly. Hepatomegaly with heterogeneous density probably reflecting metastatic disease. Pseudocirrhosis contour. Mildly enlarged mediastinal and left supraclavicular nodes, mildly increased in size from prior. Congestion as a potential cause. Mildly dilated esophagus with air-fluid level. Aortic atherosclerosis -Patient was given a dose of steroids with Solu-Medrol  125 mg x 1 yesterday and was also given a dose of IV Lasix  40 mg yesterday and now pulmonary has started the patient on 125 mg of Solu-Medrol  every 12 hours for 5 doses   Acute on Chronic Diastolic CHF -He had responded well to several days of  p.o. Lasix , currently has no evidence of fluid overload. -Not appear overtly volume overloaded will give a dose of IV Lasix  40 mg given his respiratory disc decompensation -Strict I's an O's and Daily Weights  Intake/Output Summary (Last 24 hours) at 06/11/2023 1333 Last data filed at 06/11/2023 0900 Gross per 24 hour  Intake 120 ml  Output --  Net 120 ml   -ECHOCardiogram done and showed an EF of 50 to 55% with a LVEF having low normal function with no regional wall motion abnormalities in the left ventricular diastolic parameters consistent with grade 1 diastolic dysfunction with a right ventricular systolic function also being moderately reduced -Cardiology consulted and patient will be seen by Dr. Mona today   Stage IV Neuroendocrine Tumor -Notified Dr. Lanny as a courtesy and she evaluated and will follow-up with him on Monday and recommend continue antibiotics for now -Patient is status post first-line Everolimus  -Will need outpatient follow-up with Dr. Lanny and Dr. Malva as previously planned   Paroxysmal Atrial Fibrillation -Continue to Monitor on Telemetry  -Continue Flecainide  100 mg po BID and Anticoagulation with Apixaban  5 mg po BID   Renal insufficiency/AKI -In the setting of his diuresis -BUN/Cr Trend: Recent Labs  Lab 06/04/23 1315 06/07/23 0909 06/08/23 0546 06/09/23 0841 06/10/23 0610 06/11/23 0512  BUN 22 17 15 17 21  33*  CREATININE 1.29* 1.11 0.95 0.94 1.01 1.29*  -Avoid Nephrotoxic Medications, Contrast Dyes,  Hypotension and Dehydration to Ensure Adequate Renal Perfusion and will need to Renally Adjust Meds -Continue to Monitor and Trend Renal Function carefully and repeat CMP in the AM   Mildly abnormal AST -AST trend: Recent Labs  Lab 06/04/23 1315 06/07/23 0909 06/08/23 0546 06/09/23 0841 06/10/23 0610 06/11/23 0512  AST 56* 49* 41 47* 51* 60*  -Continue to Monitor and Trend and Repeat CMP in the AM   Hypertension -Continue home ARB with  Irbesartan  300 mg po Daily -Continue to Monitor BP per Protocol -Last BP Reading was 126/71   Depression and Anxiety -C/w Buproprion 150 mg po Daily   Normocytic Anemia -Hgb/Hct Trend: Recent Labs  Lab 06/04/23 1315 06/07/23 0909 06/08/23 0546 06/09/23 0841 06/10/23 0610 06/11/23 0512  HGB 13.5 13.5 12.0* 12.3* 12.3* 13.2  HCT 39.5 39.0 35.8* 38.1* 36.1* 40.6  -Checked Anemia Panel showed an iron level of 30, UIBC 258, TIBC 288, saturation history of 10%, ferritin level of 84, folate of 4.9 and vitamin B12 level of 888 -Started Folic Acid  1 mg po Daily  -Continue to Monitor for S/Sx of Bleeding; No overt bleeding noted -Repeat CBC in the AM  Hyponatremia, improving slowly  -In the setting of Diuretics and suspected to be hypovolemic hyponatremia due to his recent reduced oral intake as well as diuretic usage -Was holding his home HCTZ and he was given a 1 L IV fluid bolus in the ED and will hold off further fluids and he was given a dose of IV Lasix  -Na+ Trend: Recent Labs  Lab 06/04/23 1315 06/07/23 0909 06/08/23 0546 06/09/23 0841 06/10/23 0610 06/11/23 0512  NA 130* 129* 132* 134* 136 137  -Continue to Monitor and Trend and repeat CMP in the AM   Hypokalemia -Patient's K+ Level Trend: Recent Labs  Lab 06/04/23 1315 06/07/23 0909 06/08/23 0546 06/09/23 0841 06/10/23 0610 06/11/23 0512  K 3.5 3.5 3.2* 3.8 4.2 4.1  -Continue to Monitor and Replete as Necessary -Repeat CMP in the AM   HLD -C/w Atorvastatin  20 mg p.o. daily but may need to hold given mildly abnormal AST  Hyperbilirubinemia, improved  -Mild and Likely Reactive -Bilirubin Trend: Recent Labs  Lab 06/04/23 1315 06/07/23 0909 06/08/23 0546 06/09/23 0841 06/10/23 0610 06/11/23 0512  BILITOT 1.3* 1.5* 1.3* 1.0 1.1 1.1  -Continue to Monitor and Trend and repeat CMP in the AM   GERD/GI Prophylaxis -C/w PPI with Pantoprazole  40 mg Daily   Hypoalbuminemia -Patient's Albumin Trend: Recent  Labs  Lab 06/04/23 1315 06/07/23 0909 06/08/23 0546 06/09/23 0841 06/10/23 0610 06/11/23 0512  ALBUMIN 3.6 2.8* 2.4* 2.5* 2.5* 2.6*  -Continue to Monitor and Trend and repeat CMP in the AM   DVT prophylaxis: SCDs Start: 06/07/23 1231 apixaban  (ELIQUIS ) tablet 5 mg    Code Status: Full Code Family Communication: No family present at bedside  Disposition Plan:  Level of care: Telemetry Status is: Inpatient Remains inpatient appropriate because: Needs further clinical improvement and clearance by the specialists and improvement in his respiratory status   Consultants:  Medical Oncology Pulmonary Cardiology  Procedures:  As delineated as above  ECHOCARDIOGRAM IMPRESSIONS     1. Left ventricular ejection fraction, by estimation, is 50 to 55%. Left  ventricular ejection fraction by 2D MOD biplane is 50.5 %. The left  ventricle has low normal function. The left ventricle has no regional wall  motion abnormalities. Left  ventricular diastolic parameters are consistent with Grade I diastolic  dysfunction (impaired relaxation).   2. Right  ventricular systolic function is mildly reduced. The right  ventricular size is normal.   3. The mitral valve is grossly normal. Trivial mitral valve  regurgitation.   4. The aortic valve is tricuspid. Aortic valve regurgitation is not  visualized. Aortic valve sclerosis/calcification is present, without any  evidence of aortic stenosis.   5. The inferior vena cava is normal in size with greater than 50%  respiratory variability, suggesting right atrial pressure of 3 mmHg.   Comparison(s): Prior images unable to be directly viewed, comparison made  by report only. Changes from prior study are noted. 07/28/2015: LVEF 50-55%.   FINDINGS   Left Ventricle: Left ventricular ejection fraction, by estimation, is 50  to 55%. Left ventricular ejection fraction by 2D MOD biplane is 50.5 %.  The left ventricle has low normal function. The left  ventricle has no  regional wall motion abnormalities.  The left ventricular internal cavity size was normal in size. There is no  left ventricular hypertrophy. Left ventricular diastolic parameters are  consistent with Grade I diastolic dysfunction (impaired relaxation).  Indeterminate filling pressures.   Right Ventricle: The right ventricular size is normal. No increase in  right ventricular wall thickness. Right ventricular systolic function is  mildly reduced.   Left Atrium: Left atrial size was normal in size.   Right Atrium: Right atrial size was normal in size.   Pericardium: There is no evidence of pericardial effusion.   Mitral Valve: The mitral valve is grossly normal. Trivial mitral valve  regurgitation.   Tricuspid Valve: The tricuspid valve is grossly normal. Tricuspid valve  regurgitation is trivial.   Aortic Valve: The aortic valve is tricuspid. Aortic valve regurgitation is  not visualized. Aortic valve sclerosis/calcification is present, without  any evidence of aortic stenosis. Aortic valve mean gradient measures 5.0  mmHg. Aortic valve peak gradient  measures 8.4 mmHg. Aortic valve area, by VTI measures 1.91 cm.   Pulmonic Valve: The pulmonic valve was normal in structure. Pulmonic valve  regurgitation is not visualized.   Aorta: The aortic root and ascending aorta are structurally normal, with  no evidence of dilitation.   Venous: The inferior vena cava is normal in size with greater than 50%  respiratory variability, suggesting right atrial pressure of 3 mmHg.   IAS/Shunts: No atrial level shunt detected by color flow Doppler.     LEFT VENTRICLE  PLAX 2D                        Biplane EF (MOD)  LVIDd:         5.30 cm         LV Biplane EF:   Left  LVIDs:         3.70 cm                          ventricular  LV PW:         1.00 cm                          ejection  LV IVS:        0.80 cm                          fraction by  LVOT diam:     2.00 cm  2D MOD  LV SV:         57                               biplane is  LV SV Index:   27                               50.5 %.  LVOT Area:     3.14 cm                                 Diastology                                 LV e' medial:    5.98 cm/s  LV Volumes (MOD)               LV E/e' medial:  8.0  LV vol d, MOD    129.2 ml      LV e' lateral:   11.60 cm/s  A2C:                           LV E/e' lateral: 4.1  LV vol d, MOD    125.8 ml  A4C:  LV vol s, MOD    64.1 ml  A2C:  LV vol s, MOD    58.0 ml  A4C:  LV SV MOD A2C:   65.2 ml  LV SV MOD A4C:   125.8 ml  LV SV MOD BP:    56.6 ml   RIGHT VENTRICLE  RV Basal diam:  3.40 cm  RV S prime:     7.94 cm/s  TAPSE (M-mode): 1.6 cm   LEFT ATRIUM             Index        RIGHT ATRIUM           Index  LA diam:        3.40 cm 1.62 cm/m   RA Area:     14.10 cm  LA Vol (A2C):   67.0 ml 31.88 ml/m  RA Volume:   29.60 ml  14.09 ml/m  LA Vol (A4C):   37.2 ml 17.70 ml/m  LA Biplane Vol: 48.8 ml 23.22 ml/m   AORTIC VALVE  AV Area (Vmax):    2.16 cm  AV Area (Vmean):   1.90 cm  AV Area (VTI):     1.91 cm  AV Vmax:           145.00 cm/s  AV Vmean:          102.000 cm/s  AV VTI:            0.296 m  AV Peak Grad:      8.4 mmHg  AV Mean Grad:      5.0 mmHg  LVOT Vmax:         99.90 cm/s  LVOT Vmean:        61.600 cm/s  LVOT VTI:          0.180 m  LVOT/AV VTI ratio: 0.61    AORTA  Ao Root diam: 3.40 cm   MITRAL VALVE  MV Area (PHT): 2.34 cm    SHUNTS  MV Decel Time: 324 msec    Systemic VTI:  0.18 m  MV E velocity: 47.60 cm/s  Systemic Diam: 2.00 cm  MV A velocity: 74.10 cm/s  MV E/A ratio:  0.64    Antimicrobials:  Anti-infectives (From admission, onward)    Start     Dose/Rate Route Frequency Ordered Stop   06/10/23 1000  azithromycin  (ZITHROMAX ) tablet 500 mg        500 mg Oral Daily 06/09/23 1252 06/11/23 0847   06/08/23 1200  cefTRIAXone  (ROCEPHIN ) 2 g in sodium chloride  0.9 % 100 mL IVPB         2 g 200 mL/hr over 30 Minutes Intravenous Every 24 hours 06/07/23 1232     06/08/23 1100  azithromycin  (ZITHROMAX ) 500 mg in sodium chloride  0.9 % 250 mL IVPB  Status:  Discontinued        500 mg 250 mL/hr over 60 Minutes Intravenous Every 24 hours 06/07/23 1232 06/09/23 1252   06/07/23 1115  cefTRIAXone  (ROCEPHIN ) 1 g in sodium chloride  0.9 % 100 mL IVPB        1 g 200 mL/hr over 30 Minutes Intravenous  Once 06/07/23 1103 06/07/23 1353   06/07/23 1115  azithromycin  (ZITHROMAX ) 500 mg in sodium chloride  0.9 % 250 mL IVPB        500 mg 250 mL/hr over 60 Minutes Intravenous  Once 06/07/23 1103 06/07/23 1522       Subjective: Seen and examined at bedside and was still feeling short of breath but doing little bit better compared to yesterday.  No nausea or vomiting.  Denies any lightheadedness or dizziness.  No other concerns or complaints this time.  Objective: Vitals:   06/10/23 2136 06/10/23 2147 06/11/23 0444 06/11/23 0808  BP:   126/71   Pulse:   73   Resp:   18   Temp:   97.9 F (36.6 C)   TempSrc:      SpO2: 94% 94% 90% 90%  Weight:      Height:        Intake/Output Summary (Last 24 hours) at 06/11/2023 1345 Last data filed at 06/11/2023 0900 Gross per 24 hour  Intake 120 ml  Output --  Net 120 ml   Filed Weights   06/07/23 1628  Weight: 86.7 kg   Examination: Physical Exam:  Constitutional: Slightly overweight elderly chronically ill-appearing Caucasian male in no acute distress Respiratory: Diminished to auscultation bilaterally with some coarse breath sounds and has some crackles and some rhonchi but no appreciable wheezing.  Has a normal respiratory effort and is not tachypneic has no accessory muscle usage but is wearing supplemental oxygen via nasal cannula Cardiovascular: RRR, no murmurs / rubs / gallops. S1 and S2 auscultated.  Mild lower extremity edema Abdomen: Soft, non-tender, distended secondary to body habitus bowel sounds positive.  GU:  Deferred. Musculoskeletal: No clubbing / cyanosis of digits/nails. No joint deformity upper and lower extremities.  Skin: No rashes, lesions, ulcers limited skin evaluation. No induration; Warm and dry.  Neurologic: CN 2-12 grossly intact with no focal deficits. Romberg sign and cerebellar reflexes not assessed.  Psychiatric: Normal judgment and insight. Alert and oriented x 3. Normal mood and appropriate affect.   Data Reviewed: I have personally reviewed following labs and imaging studies  CBC: Recent Labs  Lab 06/07/23 0909 06/08/23 0546 06/09/23 0841 06/10/23 0610 06/11/23 0512  WBC 6.4 5.4 7.2 7.9 5.4  NEUTROABS 4.9  --  5.1 5.5 4.8  HGB 13.5 12.0*  12.3* 12.3* 13.2  HCT 39.0 35.8* 38.1* 36.1* 40.6  MCV 86.9 87.7 89.2 88.7 87.9  PLT 151 162 202 219 286   Basic Metabolic Panel: Recent Labs  Lab 06/07/23 0909 06/08/23 0546 06/09/23 0841 06/10/23 0610 06/11/23 0512  NA 129* 132* 134* 136 137  K 3.5 3.2* 3.8 4.2 4.1  CL 91* 98 99 97* 99  CO2 27 25 24 29 24   GLUCOSE 143* 119* 193* 124* 262*  BUN 17 15 17 21  33*  CREATININE 1.11 0.95 0.94 1.01 1.29*  CALCIUM  8.4* 7.9* 8.4* 8.5* 8.8*  MG  --   --  2.2 2.3 2.5*  PHOS  --   --  2.8 3.3 5.0*   GFR: Estimated Creatinine Clearance: 46.5 mL/min (A) (by C-G formula based on SCr of 1.29 mg/dL (H)). Liver Function Tests: Recent Labs  Lab 06/07/23 0909 06/08/23 0546 06/09/23 0841 06/10/23 0610 06/11/23 0512  AST 49* 41 47* 51* 60*  ALT 36 30 32 30 34  ALKPHOS 456* 370* 383* 378* 411*  BILITOT 1.5* 1.3* 1.0 1.1 1.1  PROT 6.5 5.8* 6.1* 6.1* 6.7  ALBUMIN 2.8* 2.4* 2.5* 2.5* 2.6*   Recent Labs  Lab 06/07/23 0909  LIPASE 38   No results for input(s): AMMONIA in the last 168 hours. Coagulation Profile: No results for input(s): INR, PROTIME in the last 168 hours. Cardiac Enzymes: No results for input(s): CKTOTAL, CKMB, CKMBINDEX, TROPONINI in the last 168 hours. BNP (last 3 results) No results for  input(s): PROBNP in the last 8760 hours. HbA1C: No results for input(s): HGBA1C in the last 72 hours. CBG: No results for input(s): GLUCAP in the last 168 hours. Lipid Profile: No results for input(s): CHOL, HDL, LDLCALC, TRIG, CHOLHDL, LDLDIRECT in the last 72 hours. Thyroid  Function Tests: No results for input(s): TSH, T4TOTAL, FREET4, T3FREE, THYROIDAB in the last 72 hours. Anemia Panel: Recent Labs    06/10/23 0610  VITAMINB12 888  FOLATE 4.9*  FERRITIN 84  TIBC 288  IRON 30*  RETICCTPCT 1.3   Sepsis Labs: No results for input(s): PROCALCITON, LATICACIDVEN in the last 168 hours.  Recent Results (from the past 240 hours)  Culture, blood (routine x 2)     Status: None (Preliminary result)   Collection Time: 06/07/23 11:58 AM   Specimen: BLOOD  Result Value Ref Range Status   Specimen Description   Final    BLOOD LEFT ANTECUBITAL Performed at [facility], [address], [place], [place] [address]    Special Requests   Final    BOTTLES DRAWN AEROBIC AND ANAEROBIC Blood Culture adequate volume Performed at [facility], [address] [place], [place] [address]    Culture   Final    NO GROWTH 4 DAYS Performed at [facility], [address] [place], [place] [address]    Report Status PENDING  Incomplete  Culture, blood (routine x 2)     Status: Abnormal   Collection Time: 06/07/23 11:58 AM   Specimen: BLOOD  Result Value Ref Range Status   Specimen Description   Final    BLOOD RIGHT ANTECUBITAL Performed at [facility], [address] [place], [place] [address]    Special Requests   Final    BOTTLES DRAWN AEROBIC AND ANAEROBIC Blood Culture adequate volume Performed at [facility], [address], [place], [place] [address]    Culture  Setup Time   Final    GRAM POSITIVE COCCI  IN BOTH AEROBIC AND ANAEROBIC BOTTLES CRITICAL RESULT CALLED TO,  READ BACK BY AND VERIFIED WITH: PHARMD SYDNEY, D [id number] FCP    Culture (A)  Final    STAPHYLOCOCCUS HOMINIS THE SIGNIFICANCE OF ISOLATING THIS ORGANISM FROM A SINGLE SET OF BLOOD CULTURES WHEN MULTIPLE SETS ARE DRAWN IS UNCERTAIN. PLEASE NOTIFY THE MICROBIOLOGY DEPARTMENT WITHIN ONE WEEK IF SPECIATION AND SENSITIVITIES ARE REQUIRED. Performed at [facility], [address], [place], [place] [address]    Report Status 06/09/2023 FINAL  Final  Blood Culture ID Panel (Reflexed)     Status: Abnormal   Collection Time: 06/07/23 11:58 AM  Result Value Ref Range Status   Enterococcus faecalis NOT DETECTED NOT DETECTED Final   Enterococcus Faecium NOT DETECTED NOT DETECTED Final   Listeria monocytogenes NOT DETECTED NOT DETECTED Final   Staphylococcus species DETECTED (A) NOT DETECTED Final    Comment: CRITICAL RESULT CALLED TO, READ BACK BY AND VERIFIED WITH: PHARMD SYDNEY, D [phone] FCP    Staphylococcus aureus (BCID) NOT DETECTED NOT DETECTED Final   Staphylococcus epidermidis NOT DETECTED NOT DETECTED Final   Staphylococcus lugdunensis NOT DETECTED NOT DETECTED Final   Streptococcus species NOT DETECTED NOT DETECTED Final   Streptococcus agalactiae NOT DETECTED NOT DETECTED Final   Streptococcus pneumoniae NOT DETECTED NOT DETECTED Final   Streptococcus pyogenes NOT DETECTED NOT DETECTED Final   A.calcoaceticus-baumannii NOT DETECTED NOT DETECTED Final   Bacteroides fragilis NOT DETECTED NOT DETECTED Final   Enterobacterales NOT DETECTED NOT DETECTED Final   Enterobacter cloacae complex NOT DETECTED NOT DETECTED Final   Escherichia coli NOT DETECTED NOT DETECTED Final   Klebsiella aerogenes NOT DETECTED NOT DETECTED Final   Klebsiella oxytoca NOT DETECTED NOT DETECTED Final   Klebsiella pneumoniae NOT DETECTED NOT DETECTED Final   Proteus species NOT DETECTED NOT DETECTED Final   Salmonella species NOT DETECTED NOT DETECTED Final   Serratia marcescens NOT DETECTED NOT  DETECTED Final   Haemophilus influenzae NOT DETECTED NOT DETECTED Final   Neisseria meningitidis NOT DETECTED NOT DETECTED Final   Pseudomonas aeruginosa NOT DETECTED NOT DETECTED Final   Stenotrophomonas maltophilia NOT DETECTED NOT DETECTED Final   Candida albicans NOT DETECTED NOT DETECTED Final   Candida auris NOT DETECTED NOT DETECTED Final   Candida glabrata NOT DETECTED NOT DETECTED Final   Candida krusei NOT DETECTED NOT DETECTED Final   Candida parapsilosis NOT DETECTED NOT DETECTED Final   Candida tropicalis NOT DETECTED NOT DETECTED Final   Cryptococcus neoformans/gattii NOT DETECTED NOT DETECTED Final    Comment: Performed at [facility], [address] [place], [place] [address]  Culture, blood (Routine X 2) w Reflex to ID Panel     Status: None (Preliminary result)   Collection Time: 06/08/23 10:12 AM   Specimen: BLOOD LEFT HAND  Result Value Ref Range Status   Specimen Description   Final    BLOOD LEFT HAND Performed at [facility], [address] [address], [place], [place] [address]    Special Requests   Final    BOTTLES DRAWN AEROBIC ONLY Blood Culture results may not be optimal due to an inadequate volume of blood received in culture bottles Performed at [facility], [address] [place], [place] [address]    Culture   Final    NO GROWTH 3 DAYS Performed at [facility], [address] [place], [place] [address]    Report Status PENDING  Incomplete  Culture, blood (  Routine X 2) w Reflex to ID Panel     Status: None (Preliminary result)   Collection Time: 06/08/23 10:12 AM   Specimen: BLOOD RIGHT HAND  Result Value Ref Range Status   Specimen Description   Final    BLOOD RIGHT HAND Performed at [facility], [address], [place], [place] [address]    Special Requests   Final    BOTTLES DRAWN AEROBIC ONLY Blood Culture results may not be optimal due to an inadequate volume of blood received in culture  bottles Performed at [facility], [address] [place], [place] [address]    Culture   Final    NO GROWTH 3 DAYS Performed at [facility], [address] [place], [place] [address]    Report Status PENDING  Incomplete  Respiratory (~20 pathogens) panel by PCR     Status: None   Collection Time: 06/08/23  6:34 PM   Specimen: Nasopharyngeal Swab; Respiratory  Result Value Ref Range Status   Adenovirus NOT DETECTED NOT DETECTED Final   Coronavirus 229E NOT DETECTED NOT DETECTED Final    Comment: (NOTE) The Coronavirus on the Respiratory Panel, DOES NOT test for the novel  Coronavirus (2019 nCoV)    Coronavirus HKU1 NOT DETECTED NOT DETECTED Final   Coronavirus NL63 NOT DETECTED NOT DETECTED Final   Coronavirus OC43 NOT DETECTED NOT DETECTED Final   Metapneumovirus NOT DETECTED NOT DETECTED Final   Rhinovirus / Enterovirus NOT DETECTED NOT DETECTED Final   Influenza A NOT DETECTED NOT DETECTED Final   Influenza B NOT DETECTED NOT DETECTED Final   Parainfluenza Virus 1 NOT DETECTED NOT DETECTED Final   Parainfluenza Virus 2 NOT DETECTED NOT DETECTED Final   Parainfluenza Virus 3 NOT DETECTED NOT DETECTED Final   Parainfluenza Virus 4 NOT DETECTED NOT DETECTED Final   Respiratory Syncytial Virus NOT DETECTED NOT DETECTED Final   Bordetella pertussis NOT DETECTED NOT DETECTED Final   Bordetella Parapertussis NOT DETECTED NOT DETECTED Final   Chlamydophila pneumoniae NOT DETECTED NOT DETECTED Final   Mycoplasma pneumoniae NOT DETECTED NOT DETECTED Final    Comment: Performed at [facility], [address], [place], [place] [address]  SARS Coronavirus 2 by RT PCR (hospital order, performed in [facility] hospital lab) *cepheid single result test* Anterior Nasal Swab     Status: None   Collection Time: 06/09/23  7:25 PM   Specimen: Anterior Nasal Swab  Result Value Ref Range Status   SARS Coronavirus 2 by RT PCR NEGATIVE NEGATIVE Final    Comment:  (NOTE) SARS-CoV-2 target nucleic acids are NOT DETECTED.  The SARS-CoV-2 RNA is generally detectable in upper and lower respiratory specimens during the acute phase of infection. The lowest concentration of SARS-CoV-2 viral copies this assay can detect is 250 copies / mL. A negative result does not preclude SARS-CoV-2 infection and should not be used as the sole basis for treatment or other patient management decisions.  A negative result may occur with improper specimen collection / handling, submission of specimen other than nasopharyngeal swab, presence of viral mutation(s) within the areas targeted by this assay, and inadequate number of viral copies (<250 copies / mL). A negative result must be combined with clinical observations, patient history, and epidemiological information.  Fact Sheet for Patients:   roadlaptop.co.za  Fact Sheet for Healthcare Providers: http://kim-miller.com/  This test is not yet approved or  cleared by the [place]  FDA and has been authorized  for detection and/or diagnosis of SARS-CoV-2 by FDA under an Emergency Use Authorization (EUA).  This EUA will remain in effect (meaning this test can be used) for the duration of the COVID-19 declaration under Section 564(b)(1) of the Act, 21 U.S.C. section 360bbb-3(b)(1), unless the authorization is terminated or revoked sooner.  Performed at [facility], [address], [place], [place] [address]     Radiology Studies: DG CHEST PORT 1 VIEW Result Date: 06/11/2023 CLINICAL DATA:  Community acquired pneumonia.  Shortness of breath. EXAM: PORTABLE CHEST 1 VIEW COMPARISON:  Chest radiograph dated 06/09/2023. FINDINGS: No significant interval change in the bilateral pulmonary opacities, left greater than right. No pleural effusion or pneumothorax. Stable cardiac silhouette no acute osseous pathology. IMPRESSION: No significant interval change in  the bilateral pulmonary opacities, left greater than right. Electronically Signed   By: Vanetta Chou M.D.   On: 06/11/2023 10:21   CT Angio Chest Pulmonary Embolism (PE) W or WO Contrast Result Date: 06/10/2023 CLINICAL DATA:  Chronic dyspnea, neuroendocrine tumor with hepatic metastatic disease. * Tracking Code: BO * EXAM: CT ANGIOGRAPHY CHEST WITH CONTRAST TECHNIQUE: Multidetector CT imaging of the chest was performed using the standard protocol during bolus administration of intravenous contrast. Multiplanar CT image reconstructions and MIPs were obtained to evaluate the vascular anatomy. RADIATION DOSE REDUCTION: This exam was performed according to the departmental dose-optimization program which includes automated exposure control, adjustment of the mA and/or kV according to patient size and/or use of iterative reconstruction technique. CONTRAST:  75mL OMNIPAQUE  IOHEXOL  350 MG/ML SOLN COMPARISON:  PET-CT 04/16/2023 and CT chest 03/21/2023 FINDINGS: Despite efforts by the technologist and patient, motion artifact is present on today's exam and could not be eliminated. This reduces exam sensitivity and specificity. Cardiovascular: No filling defect is identified in the pulmonary arterial tree to suggest pulmonary embolus. Coronary, aortic arch, and branch vessel atherosclerotic vascular disease. Mild cardiomegaly. Mediastinum/Nodes: Upper prevascular node 1.1 cm on image 36 series 4, mildly enlarged, previously measuring 0.8 cm. Left supraclavicular node 1.2 cm in short axis on image 30 series 4, formerly 1.0 cm. Mild right paratracheal adenopathy as well. Mildly dilated esophagus with air-fluid level. Lungs/Pleura: Bilateral airspace opacities favoring acute pulmonary edema. Small left pleural effusion. Upper Abdomen: Hepatomegaly. Heterogeneous density probably reflecting metastatic disease. Pseudo cirrhosis contour. Musculoskeletal: No acute bony findings. Review of the MIP images confirms the above  findings. IMPRESSION: 1. No filling defect is identified in the pulmonary arterial tree to suggest pulmonary embolus. 2. Bilateral airspace opacities favoring acute pulmonary edema. Small left pleural effusion. 3. Mild cardiomegaly. 4. Hepatomegaly with heterogeneous density probably reflecting metastatic disease. Pseudocirrhosis contour. 5. Mildly enlarged mediastinal and left supraclavicular nodes, mildly increased in size from prior. Congestion as a potential cause. 6. Mildly dilated esophagus with air-fluid level. 7. Aortic atherosclerosis. Aortic Atherosclerosis (ICD10-I70.0). Electronically Signed   By: Ryan Salvage M.D.   On: 06/10/2023 17:07   Scheduled Meds:  apixaban   5 mg Oral BID   atorvastatin   20 mg Oral Daily   budesonide  (PULMICORT ) nebulizer solution  0.25 mg Nebulization BID   buPROPion   150 mg Oral q morning   busPIRone   5 mg Oral QHS   feeding supplement  237 mL Oral BID BM   flecainide   100 mg Oral BID   [START ON 06/12/2023] folic acid   1 mg Oral Daily   guaiFENesin   1,200 mg Oral BID   ipratropium  0.5 mg Nebulization TID   irbesartan   300 mg Oral Daily  levalbuterol   0.63 mg Nebulization TID   methylPREDNISolone  (SOLU-MEDROL ) injection  125 mg Intravenous Q12H   mirtazapine   15 mg Oral QHS   pantoprazole   40 mg Oral Daily   Continuous Infusions:  cefTRIAXone  (ROCEPHIN )  IV 2 g (06/11/23 1233)    LOS: 3 days   Alejandro Marker, DO [facility] Available via Epic secure chat 7am-7pm After these hours, please refer to coverage provider listed on [email or web address] 06/11/2023, 1:45 PM

## 2023-06-11 NOTE — Progress Notes (Signed)
 Corey [name]   DOB:[birth year]   FM#:[id number]   RDW#:[id number]  MED/ONC FOLLOW UP NOTE   Subjective: Patient's hypoxia got worse over the weekend, required high-volume nasal cannula 9 L/min.  CTA was negative for PE, he has been seen by pulmonary, and started on steroid.  He is clinically stable, no significant dyspnea at rest with oxygen. Son at bed side.    Objective:  Vitals:   06/11/23 0808 06/11/23 1351  BP:  111/63  Pulse:  78  Resp:    Temp:  97.7 F (36.5 C)  SpO2: 90% 93%    Body mass index is 24.37 kg/m.  Intake/Output Summary (Last 24 hours) at 06/11/2023 1659 Last data filed at 06/11/2023 0900 Gross per 24 hour  Intake 120 ml  Output --  Net 120 ml     Sclerae unicteric  Oropharynx clear  No peripheral adenopathy  Lungs clear -- no rales or rhonchi  Heart regular rate and rhythm  Abdomen benign  MSK no focal spinal tenderness, no peripheral edema  Neuro nonfocal    CBG (last 3)  No results for input(s): GLUCAP in the last 72 hours.   Labs:   Urine Studies No results for input(s): UHGB, CRYS in the last 72 hours.  Invalid input(s): UACOL, UAPR, USPG, UPH, UTP, UGL, [place], UBIL, UNIT, UROB, [place], UEPI, TERRAL CORINN [place], [place]  Basic Metabolic Panel: Recent Labs  Lab 06/07/23 [phone] 06/08/23 0546 06/09/23 0841 06/10/23 0610 06/11/23 0512  NA 129* 132* 134* 136 137  K 3.5 3.2* 3.8 4.2 4.1  CL 91* 98 99 97* 99  CO2 27 25 24 29 24   GLUCOSE 143* 119* 193* 124* 262*  BUN 17 15 17 21  33*  CREATININE 1.11 0.95 0.94 1.01 1.29*  CALCIUM  8.4* 7.9* 8.4* 8.5* 8.8*  MG  --   --  2.2 2.3 2.5*  PHOS  --   --  2.8 3.3 5.0*   GFR Estimated Creatinine Clearance: 46.5 mL/min (A) (by C-G formula based on SCr of 1.29 mg/dL (H)). Liver Function Tests: Recent Labs  Lab 06/07/23 0909 06/08/23 0546 06/09/23 0841 06/10/23 0610 06/11/23 0512  AST 49* 41 47* 51* 60*  ALT 36 30 32 30 34  ALKPHOS 456*  370* 383* 378* 411*  BILITOT 1.5* 1.3* 1.0 1.1 1.1  PROT 6.5 5.8* 6.1* 6.1* 6.7  ALBUMIN 2.8* 2.4* 2.5* 2.5* 2.6*   Recent Labs  Lab 06/07/23 0909  LIPASE 38   No results for input(s): AMMONIA in the last 168 hours. Coagulation profile No results for input(s): INR, PROTIME in the last 168 hours.  CBC: Recent Labs  Lab 06/07/23 0909 06/08/23 0546 06/09/23 0841 06/10/23 0610 06/11/23 0512  WBC 6.4 5.4 7.2 7.9 5.4  NEUTROABS 4.9  --  5.1 5.5 4.8  HGB 13.5 12.0* 12.3* 12.3* 13.2  HCT 39.0 35.8* 38.1* 36.1* 40.6  MCV 86.9 87.7 89.2 88.7 87.9  PLT 151 162 202 219 286   Cardiac Enzymes: No results for input(s): CKTOTAL, CKMB, CKMBINDEX, TROPONINI in the last 168 hours. BNP: Invalid input(s): POCBNP CBG: No results for input(s): GLUCAP in the last 168 hours. D-Dimer No results for input(s): DDIMER in the last 72 hours. Hgb A1c No results for input(s): HGBA1C in the last 72 hours. Lipid Profile No results for input(s): CHOL, HDL, LDLCALC, TRIG, CHOLHDL, LDLDIRECT in the last 72 hours. Thyroid  function studies No results for input(s): TSH, T4TOTAL, T3FREE, THYROIDAB in the last 72 hours.  Invalid input(s): FREET3  Anemia work up Recent Labs    06/10/23 0610  VITAMINB12 888  FOLATE 4.9*  FERRITIN 84  TIBC 288  IRON 30*  RETICCTPCT 1.3   Microbiology Recent Results (from the past 240 hours)  Culture, blood (routine x 2)     Status: None (Preliminary result)   Collection Time: 06/07/23 11:58 AM   Specimen: BLOOD  Result Value Ref Range Status   Specimen Description   Final    BLOOD LEFT ANTECUBITAL Performed at [facility], [address], [place], [place] [address]    Special Requests   Final    BOTTLES DRAWN AEROBIC AND ANAEROBIC Blood Culture adequate volume Performed at [facility], [address], [place], [place] [address]    Culture   Final    NO GROWTH 4 DAYS Performed  at [facility], [address], [place], [place] [address]    Report Status PENDING  Incomplete  Culture, blood (routine x 2)     Status: Abnormal   Collection Time: 06/07/23 11:58 AM   Specimen: BLOOD  Result Value Ref Range Status   Specimen Description   Final    BLOOD RIGHT ANTECUBITAL Performed at [facility], [address] [place], [place] [address]    Special Requests   Final    BOTTLES DRAWN AEROBIC AND ANAEROBIC Blood Culture adequate volume Performed at [facility], [address], [place], [place] [address]    Culture  Setup Time   Final    GRAM POSITIVE COCCI IN BOTH AEROBIC AND ANAEROBIC BOTTLES CRITICAL RESULT CALLED TO, READ BACK BY AND VERIFIED WITH: PHARMD SYDNEY, D [phone] FCP    Culture (A)  Final    STAPHYLOCOCCUS HOMINIS THE SIGNIFICANCE OF ISOLATING THIS ORGANISM FROM A SINGLE SET OF BLOOD CULTURES WHEN MULTIPLE SETS ARE DRAWN IS UNCERTAIN. PLEASE NOTIFY THE MICROBIOLOGY DEPARTMENT WITHIN ONE WEEK IF SPECIATION AND SENSITIVITIES ARE REQUIRED. Performed at [facility], [address], [place], [place] [address]    Report Status 06/09/2023 FINAL  Final  Blood Culture ID Panel (Reflexed)     Status: Abnormal   Collection Time: 06/07/23 11:58 AM  Result Value Ref Range Status   Enterococcus faecalis NOT DETECTED NOT DETECTED Final   Enterococcus Faecium NOT DETECTED NOT DETECTED Final   Listeria monocytogenes NOT DETECTED NOT DETECTED Final   Staphylococcus species DETECTED (A) NOT DETECTED Final    Comment: CRITICAL RESULT CALLED TO, READ BACK BY AND VERIFIED WITH: PHARMD SYDNEY, D [phone] FCP    Staphylococcus aureus (BCID) NOT DETECTED NOT DETECTED Final   Staphylococcus epidermidis NOT DETECTED NOT DETECTED Final   Staphylococcus lugdunensis NOT DETECTED NOT DETECTED Final   Streptococcus species NOT DETECTED NOT DETECTED Final   Streptococcus agalactiae NOT DETECTED NOT DETECTED Final    Streptococcus pneumoniae NOT DETECTED NOT DETECTED Final   Streptococcus pyogenes NOT DETECTED NOT DETECTED Final   A.calcoaceticus-baumannii NOT DETECTED NOT DETECTED Final   Bacteroides fragilis NOT DETECTED NOT DETECTED Final   Enterobacterales NOT DETECTED NOT DETECTED Final   Enterobacter cloacae complex NOT DETECTED NOT DETECTED Final   Escherichia coli NOT DETECTED NOT DETECTED Final   Klebsiella aerogenes NOT DETECTED NOT DETECTED Final   Klebsiella oxytoca NOT DETECTED NOT DETECTED Final   Klebsiella pneumoniae NOT DETECTED NOT DETECTED Final   Proteus species NOT DETECTED NOT DETECTED Final   Salmonella species NOT DETECTED NOT DETECTED Final   Serratia marcescens NOT DETECTED  NOT DETECTED Final   Haemophilus influenzae NOT DETECTED NOT DETECTED Final   Neisseria meningitidis NOT DETECTED NOT DETECTED Final   Pseudomonas aeruginosa NOT DETECTED NOT DETECTED Final   Stenotrophomonas maltophilia NOT DETECTED NOT DETECTED Final   Candida albicans NOT DETECTED NOT DETECTED Final   Candida auris NOT DETECTED NOT DETECTED Final   Candida glabrata NOT DETECTED NOT DETECTED Final   Candida krusei NOT DETECTED NOT DETECTED Final   Candida parapsilosis NOT DETECTED NOT DETECTED Final   Candida tropicalis NOT DETECTED NOT DETECTED Final   Cryptococcus neoformans/gattii NOT DETECTED NOT DETECTED Final    Comment: Performed at [facility], [address] [place], [place] [address]  Culture, blood (Routine X 2) w Reflex to ID Panel     Status: None (Preliminary result)   Collection Time: 06/08/23 10:12 AM   Specimen: BLOOD LEFT HAND  Result Value Ref Range Status   Specimen Description   Final    BLOOD LEFT HAND Performed at [facility], [address], [place], [place] [address]    Special Requests   Final    BOTTLES DRAWN AEROBIC ONLY Blood Culture results may not be optimal due to an inadequate volume of blood received in culture bottles Performed at [facility], [address] [place], [place] [address]    Culture   Final    NO GROWTH 3 DAYS Performed at [facility], [address], [place], [place] [address]    Report Status PENDING  Incomplete  Culture, blood (Routine X 2) w Reflex to ID Panel     Status: None (Preliminary result)   Collection Time: 06/08/23 10:12 AM   Specimen: BLOOD RIGHT HAND  Result Value Ref Range Status   Specimen Description   Final    BLOOD RIGHT HAND Performed at [facility], [address], [place], [place] [address]    Special Requests   Final    BOTTLES DRAWN AEROBIC ONLY Blood Culture results may not be optimal due to an inadequate volume of blood received in culture bottles Performed at [facility], [address], [place], [place] [address]    Culture   Final    NO GROWTH 3 DAYS Performed at [facility], [address], [place], [place] [address]    Report Status PENDING  Incomplete  Respiratory (~20 pathogens) panel by PCR     Status: None   Collection Time: 06/08/23  6:34 PM   Specimen: Nasopharyngeal Swab; Respiratory  Result Value Ref Range Status   Adenovirus NOT DETECTED NOT DETECTED Final   Coronavirus 229E NOT DETECTED NOT DETECTED Final    Comment: (NOTE) The Coronavirus on the Respiratory Panel, DOES NOT test for the novel  Coronavirus (2019 nCoV)    Coronavirus HKU1 NOT DETECTED NOT DETECTED Final   Coronavirus NL63 NOT DETECTED NOT DETECTED Final   Coronavirus OC43 NOT DETECTED NOT DETECTED Final   Metapneumovirus NOT DETECTED NOT DETECTED Final   Rhinovirus / Enterovirus NOT DETECTED NOT DETECTED Final   Influenza A NOT DETECTED NOT DETECTED Final   Influenza B NOT DETECTED NOT DETECTED Final   Parainfluenza Virus 1 NOT DETECTED NOT DETECTED Final   Parainfluenza Virus 2 NOT DETECTED NOT DETECTED Final   Parainfluenza Virus 3 NOT DETECTED NOT DETECTED Final   Parainfluenza Virus 4 NOT DETECTED NOT DETECTED Final    Respiratory Syncytial Virus NOT DETECTED NOT DETECTED Final   Bordetella pertussis  NOT DETECTED NOT DETECTED Final   Bordetella Parapertussis NOT DETECTED NOT DETECTED Final   Chlamydophila pneumoniae NOT DETECTED NOT DETECTED Final   Mycoplasma pneumoniae NOT DETECTED NOT DETECTED Final    Comment: Performed at [facility], [address], [place], [place] [address]  SARS Coronavirus 2 by RT PCR (hospital order, performed in [facility] hospital lab) *cepheid single result test* Anterior Nasal Swab     Status: None   Collection Time: 06/09/23  7:25 PM   Specimen: Anterior Nasal Swab  Result Value Ref Range Status   SARS Coronavirus 2 by RT PCR NEGATIVE NEGATIVE Final    Comment: (NOTE) SARS-CoV-2 target nucleic acids are NOT DETECTED.  The SARS-CoV-2 RNA is generally detectable in upper and lower respiratory specimens during the acute phase of infection. The lowest concentration of SARS-CoV-2 viral copies this assay can detect is 250 copies / mL. A negative result does not preclude SARS-CoV-2 infection and should not be used as the sole basis for treatment or other patient management decisions.  A negative result may occur with improper specimen collection / handling, submission of specimen other than nasopharyngeal swab, presence of viral mutation(s) within the areas targeted by this assay, and inadequate number of viral copies (<250 copies / mL). A negative result must be combined with clinical observations, patient history, and epidemiological information.  Fact Sheet for Patients:   roadlaptop.co.za  Fact Sheet for Healthcare Providers: http://kim-miller.com/  This test is not yet approved or  cleared by the [place]  FDA and has been authorized for detection and/or diagnosis of SARS-CoV-2 by FDA under an Emergency Use Authorization (EUA).  This EUA will remain in effect (meaning this test can be used) for the duration of  the COVID-19 declaration under Section 564(b)(1) of the Act, 21 U.S.C. section 360bbb-3(b)(1), unless the authorization is terminated or revoked sooner.  Performed at [facility], [address] [place], [place] [address]       Studies:  DG CHEST PORT 1 VIEW Result Date: 06/11/2023 CLINICAL DATA:  Community acquired pneumonia.  Shortness of breath. EXAM: PORTABLE CHEST 1 VIEW COMPARISON:  Chest radiograph dated 06/09/2023. FINDINGS: No significant interval change in the bilateral pulmonary opacities, left greater than right. No pleural effusion or pneumothorax. Stable cardiac silhouette no acute osseous pathology. IMPRESSION: No significant interval change in the bilateral pulmonary opacities, left greater than right. Electronically Signed   By: Vanetta Chou M.D.   On: 06/11/2023 10:21   CT Angio Chest Pulmonary Embolism (PE) W or WO Contrast Result Date: 06/10/2023 CLINICAL DATA:  Chronic dyspnea, neuroendocrine tumor with hepatic metastatic disease. * Tracking Code: BO * EXAM: CT ANGIOGRAPHY CHEST WITH CONTRAST TECHNIQUE: Multidetector CT imaging of the chest was performed using the standard protocol during bolus administration of intravenous contrast. Multiplanar CT image reconstructions and MIPs were obtained to evaluate the vascular anatomy. RADIATION DOSE REDUCTION: This exam was performed according to the departmental dose-optimization program which includes automated exposure control, adjustment of the mA and/or kV according to patient size and/or use of iterative reconstruction technique. CONTRAST:  75mL OMNIPAQUE  IOHEXOL  350 MG/ML SOLN COMPARISON:  PET-CT 04/16/2023 and CT chest 03/21/2023 FINDINGS: Despite efforts by the technologist and patient, motion artifact is present on today's exam and could not be eliminated. This reduces exam sensitivity and specificity. Cardiovascular: No filling defect is identified in the pulmonary arterial tree to suggest pulmonary  embolus. Coronary, aortic arch, and branch vessel atherosclerotic vascular disease. Mild cardiomegaly. Mediastinum/Nodes: Upper prevascular  node 1.1 cm on image 36 series 4, mildly enlarged, previously measuring 0.8 cm. Left supraclavicular node 1.2 cm in short axis on image 30 series 4, formerly 1.0 cm. Mild right paratracheal adenopathy as well. Mildly dilated esophagus with air-fluid level. Lungs/Pleura: Bilateral airspace opacities favoring acute pulmonary edema. Small left pleural effusion. Upper Abdomen: Hepatomegaly. Heterogeneous density probably reflecting metastatic disease. Pseudo cirrhosis contour. Musculoskeletal: No acute bony findings. Review of the MIP images confirms the above findings. IMPRESSION: 1. No filling defect is identified in the pulmonary arterial tree to suggest pulmonary embolus. 2. Bilateral airspace opacities favoring acute pulmonary edema. Small left pleural effusion. 3. Mild cardiomegaly. 4. Hepatomegaly with heterogeneous density probably reflecting metastatic disease. Pseudocirrhosis contour. 5. Mildly enlarged mediastinal and left supraclavicular nodes, mildly increased in size from prior. Congestion as a potential cause. 6. Mildly dilated esophagus with air-fluid level. 7. Aortic atherosclerosis. Aortic Atherosclerosis (ICD10-I70.0). Electronically Signed   By: Ryan Salvage M.D.   On: 06/10/2023 17:07    Assessment: 89 y.o. male   Multifocal community-acquired pneumonia, vs virus or medication induced pneumonitis Hypoxic respiratory failure Stage IV neuroendocrine tumor, status post first-line everolimus  CHF with acute pulmonary edema HTN and AF    Plan:  -Recently reviewed his CT images, which showed a diffuse bilateral infiltrates.  I agree with antibiotics and steroids, he has been seen by pulmonary team. -Continue supportive care -I will follow-up later this week.   Onita Mattock, MD 06/11/2023  4:59 PM

## 2023-06-11 NOTE — Consult Note (Signed)
 CONSULTATION NOTE   Patient Name: Corey [name] Date of Encounter: 06/11/2023 Cardiologist: Vinie JAYSON Maxcy, MD Electrophysiologist: None Advanced Heart Failure: None   Chief Complaint   Shortness of breath  Patient Profile   89 year old male patient of mine with a history of atrial fibrillation, hypertension and coronary artery disease and recent low normal LVEF, on treatment for neuroendocrine tumor, who presented with acute diastolic heart failure and pneumonia.  After initial improvement he developed acute shortness of breath requiring additional diuresis.  Cardiology is asked to consult for recommendations.  HPI   Corey [name] is a 89 y.o. male who is being seen today for the evaluation of CHF at the request of Dr. Sherrill. This is a well-known 89 year old male patient of mine that I recently saw in the office on June 05, 2023.  I was contacted by his oncologist as there was concerns about hypoxemia and chest x-ray which showed possible heart failure versus pneumonia.  When I saw him in the office it appeared that he did have some acute heart failure with lower extremity edema and crackles in the lungs.  He had no fever or leukocytosis.  I started him on Lasix  and he urinated substantially with this however his shortness of breath worsened.  On presentation to the emergency department 2 days later he was noted to have interval worsening of his chest x-ray concerning for pneumonia.  He was seen by pulmonary and felt to have possible pneumonia versus pneumonitis versus possible pulmonary edema.  He was treated with steroids and antibiotics.  1 of 4 blood cultures came back with possible contaminant.  He was seen again by Dr. Lanny 3 days ago.  He was improving however today he decompensated further and noted desaturated to 75% on 2 L of oxygen.  A repeat CT with PE protocol was ordered although he has been anticoagulated.  It is also been considered he might be having  pneumonitis related to everolimus .  PE protocol was negative for PE yesterday however was felt to have acute pulmonary edema and small left pleural effusion.  Chest x-ray today shows no significant interval change in bilateral pulmonary opacities, left greater than right.  Ins and outs are not accurately recorded.  Daily weights are not monitored.  Echo again showed LVEF 50 to 55% with grade 1 diastolic dysfunction, similar to prior studies.  I personally reviewed his echo.  Repeat BNP today however is higher at 446.9, up from 302.2.  PMHx   Past Medical History:  Diagnosis Date   Basal cell carcinoma of auricle of right ear    BPH (benign prostatic hyperplasia)    DJD (degenerative joint disease)    left   ED (erectile dysfunction)    Fatigue    Hearing loss, sensorineural    Hx of adenomatous colonic polyps    Hx of major depression    Hypertension    Iron deficiency anemia    Nephrolithiasis    Paroxysmal atrial fibrillation (HCC)    Seasonal allergic rhinitis     Past Surgical History:  Procedure Laterality Date   CARDIOVERSION N/A 08/13/2015   Procedure: CARDIOVERSION;  Surgeon: Vinie JAYSON Maxcy, MD;  Location: [facility] ENDOSCOPY;  Service: Cardiovascular;  Laterality: N/A;   CARDIOVERSION N/A 04/04/2016   Procedure: CARDIOVERSION;  Surgeon: Wilbert JONELLE Bihari, MD;  Location: [facility] ENDOSCOPY;  Service: Cardiovascular;  Laterality: N/A;   CARDIOVERSION N/A 11/12/2017   Procedure: CARDIOVERSION;  Surgeon: Maxcy Vinie JAYSON, MD;  Location: [facility]  ENDOSCOPY;  Service: Cardiovascular;  Laterality: N/A;   CHOLECYSTECTOMY     colonscopy     herniorraphy Bilateral    had surgery x2 on the left  and right   TONSILLECTOMY AND ADENOIDECTOMY      FAMHx   Family History  Problem Relation Age of Onset   CAD Mother    Heart disease Mother    Heart attack Father    Cancer - Prostate Brother    Alcoholism Daughter    Arrhythmia Daughter    Healthy Son    Healthy Daughter     SOCHx    reports that  he quit smoking about 62 years ago. His smoking use included cigarettes. He started smoking about 64 years ago. He has a 1.5 pack-year smoking history. He has never used smokeless tobacco. He reports that he does not drink alcohol  and does not use drugs.  Outpatient Medications   No current facility-administered medications on file prior to encounter.   Current Outpatient Medications on File Prior to Encounter  Medication Sig Dispense Refill   amLODipine  (NORVASC ) 2.5 MG tablet Take 1 tablet (2.5 mg total) by mouth daily. 90 tablet 3   apixaban  (ELIQUIS ) 5 MG TABS tablet Take 5 mg by mouth 2 (two) times daily.     atorvastatin  (LIPITOR) 20 MG tablet Take 20 mg by mouth daily.     buPROPion  (WELLBUTRIN  XL) 150 MG 24 hr tablet Take 150 mg by mouth every morning.     busPIRone  (BUSPAR ) 5 MG tablet Take 5 mg by mouth at bedtime.     flecainide  (TAMBOCOR ) 100 MG tablet Take 1 tablet (100 mg total) by mouth 2 (two) times daily. 180 tablet 1   furosemide  (LASIX ) 40 MG tablet Take 1 tablet (40 mg total) by mouth daily. 90 tablet 3   mirtazapine  (REMERON ) 15 MG tablet Take 15 mg by mouth at bedtime.     omeprazole  (PRILOSEC) 20 MG capsule Take 1 capsule by mouth once daily (Patient taking differently: Take 20 mg by mouth daily before breakfast.) 30 capsule 0   TYLENOL  500 MG tablet Take 500-1,000 mg by mouth every 6 (six) hours as needed for headache, mild pain (pain score 1-3) or fever.     valsartan -hydrochlorothiazide  (DIOVAN -HCT) 320-12.5 MG tablet Take 1 tablet by mouth daily.     vitamin B-12 (CYANOCOBALAMIN ) 500 MCG tablet Take 500 mcg by mouth 3 (three) times a week.     traMADol  (ULTRAM ) 50 MG tablet Take 1 tablet (50 mg total) by mouth every 6 (six) hours as needed. (Patient not taking: Reported on 06/07/2023) 15 tablet 0    Inpatient Medications    Scheduled Meds:  apixaban   5 mg Oral BID   atorvastatin   20 mg Oral Daily   budesonide  (PULMICORT ) nebulizer solution  0.25 mg Nebulization  BID   buPROPion   150 mg Oral q morning   busPIRone   5 mg Oral QHS   feeding supplement  237 mL Oral BID BM   flecainide   100 mg Oral BID   [START ON 06/12/2023] folic acid   1 mg Oral Daily   guaiFENesin   1,200 mg Oral BID   ipratropium  0.5 mg Nebulization TID   irbesartan   300 mg Oral Daily   levalbuterol   0.63 mg Nebulization TID   methylPREDNISolone  (SOLU-MEDROL ) injection  125 mg Intravenous Q12H   mirtazapine   15 mg Oral QHS   pantoprazole   40 mg Oral Daily    Continuous Infusions:  cefTRIAXone  (ROCEPHIN )  IV  2 g (06/11/23 1233)    PRN Meds: acetaminophen  **OR** acetaminophen , albuterol , ondansetron  **OR** ondansetron  (ZOFRAN ) IV, traZODone    ALLERGIES   No Known Allergies  ROS   Pertinent items noted in HPI and remainder of comprehensive ROS otherwise negative.  Vitals   Vitals:   06/10/23 2147 06/11/23 0444 06/11/23 0808 06/11/23 1351  BP:  126/71  111/63  Pulse:  73  78  Resp:  18    Temp:  97.9 F (36.6 C)  97.7 F (36.5 C)  TempSrc:    Oral  SpO2: 94% 90% 90% 93%  Weight:      Height:        Intake/Output Summary (Last 24 hours) at 06/11/2023 1505 Last data filed at 06/11/2023 0900 Gross per 24 hour  Intake 120 ml  Output --  Net 120 ml   Filed Weights   06/07/23 1628  Weight: 86.7 kg    Physical Exam   General appearance: alert, mild distress, and pale Neck: JVD - 3 cm above sternal notch, no carotid bruit, and thyroid  not enlarged, symmetric, no tenderness/mass/nodules Lungs: diminished breath sounds bibasilar Heart: regular rate and rhythm Abdomen: soft, non-tender; bowel sounds normal; no masses,  no organomegaly Extremities: extremities normal, atraumatic, no cyanosis or edema Pulses: 2+ and symmetric Skin: pale, cool, dry Neurologic: Grossly normal Psych: Pleasant  Labs   Results for orders placed or performed during the hospital encounter of 06/07/23 (from the past 48 hours)  SARS Coronavirus 2 by RT PCR (hospital order,  performed in [facility] hospital lab) *cepheid single result test* Anterior Nasal Swab     Status: None   Collection Time: 06/09/23  7:25 PM   Specimen: Anterior Nasal Swab  Result Value Ref Range   SARS Coronavirus 2 by RT PCR NEGATIVE NEGATIVE    Comment: (NOTE) SARS-CoV-2 target nucleic acids are NOT DETECTED.  The SARS-CoV-2 RNA is generally detectable in upper and lower respiratory specimens during the acute phase of infection. The lowest concentration of SARS-CoV-2 viral copies this assay can detect is 250 copies / mL. A negative result does not preclude SARS-CoV-2 infection and should not be used as the sole basis for treatment or other patient management decisions.  A negative result may occur with improper specimen collection / handling, submission of specimen other than nasopharyngeal swab, presence of viral mutation(s) within the areas targeted by this assay, and inadequate number of viral copies (<250 copies / mL). A negative result must be combined with clinical observations, patient history, and epidemiological information.  Fact Sheet for Patients:   roadlaptop.co.za  Fact Sheet for Healthcare Providers: http://kim-miller.com/  This test is not yet approved or  cleared by the [place]  FDA and has been authorized for detection and/or diagnosis of SARS-CoV-2 by FDA under an Emergency Use Authorization (EUA).  This EUA will remain in effect (meaning this test can be used) for the duration of the COVID-19 declaration under Section 564(b)(1) of the Act, 21 U.S.C. section 360bbb-3(b)(1), unless the authorization is terminated or revoked sooner.  Performed at [facility], [address], [place], [place] [address]   CBC with Differential/Platelet     Status: Abnormal   Collection Time: 06/10/23  6:10 AM  Result Value Ref Range   WBC 7.9 4.0 - 10.5 K/uL   RBC 4.07 (L) 4.22 - 5.81 MIL/uL   Hemoglobin  12.3 (L) 13.0 - 17.0 g/dL   HCT 63.8 (L) 60.9 - 47.9 %   MCV 88.7 80.0 -  100.0 fL   MCH 30.2 26.0 - 34.0 pg   MCHC 34.1 30.0 - 36.0 g/dL   RDW 86.3 88.4 - 84.4 %   Platelets 219 150 - 400 K/uL   nRBC 0.0 0.0 - 0.2 %   Neutrophils Relative % 69 %   Neutro Abs 5.5 1.7 - 7.7 K/uL   Lymphocytes Relative 10 %   Lymphs Abs 0.8 0.7 - 4.0 K/uL   Monocytes Relative 15 %   Monocytes Absolute 1.2 (H) 0.1 - 1.0 K/uL   Eosinophils Relative 5 %   Eosinophils Absolute 0.4 0.0 - 0.5 K/uL   Basophils Relative 1 %   Basophils Absolute 0.1 0.0 - 0.1 K/uL   Immature Granulocytes 0 %   Abs Immature Granulocytes 0.02 0.00 - 0.07 K/uL    Comment: Performed at [facility], [address] [place], [place] [address]  Comprehensive metabolic panel     Status: Abnormal   Collection Time: 06/10/23  6:10 AM  Result Value Ref Range   Sodium 136 135 - 145 mmol/L   Potassium 4.2 3.5 - 5.1 mmol/L   Chloride 97 (L) 98 - 111 mmol/L   CO2 29 22 - 32 mmol/L   Glucose, Bld 124 (H) 70 - 99 mg/dL    Comment: Glucose reference range applies only to samples taken after fasting for at least 8 hours.   BUN 21 8 - 23 mg/dL   Creatinine, Ser 8.98 0.61 - 1.24 mg/dL   Calcium  8.5 (L) 8.9 - 10.3 mg/dL   Total Protein 6.1 (L) 6.5 - 8.1 g/dL   Albumin 2.5 (L) 3.5 - 5.0 g/dL   AST 51 (H) 15 - 41 U/L   ALT 30 0 - 44 U/L   Alkaline Phosphatase 378 (H) 38 - 126 U/L   Total Bilirubin 1.1 0.0 - 1.2 mg/dL   GFR, Estimated >39 >39 mL/min    Comment: (NOTE) Calculated using the CKD-EPI Creatinine Equation (2021)    Anion gap 10 5 - 15    Comment: Performed at [facility], [address], [place], [place] [address]  Magnesium     Status: None   Collection Time: 06/10/23  6:10 AM  Result Value Ref Range   Magnesium 2.3 1.7 - 2.4 mg/dL    Comment: Performed at [facility], [address] [place], [place] [address]  Phosphorus     Status: None   Collection Time:  06/10/23  6:10 AM  Result Value Ref Range   Phosphorus 3.3 2.5 - 4.6 mg/dL    Comment: Performed at [facility], [address] [place], [place] [address]  Vitamin B12     Status: None   Collection Time: 06/10/23  6:10 AM  Result Value Ref Range   Vitamin B-12 888 180 - 914 pg/mL    Comment: (NOTE) This assay is not validated for testing neonatal or myeloproliferative syndrome specimens for Vitamin B12 levels. Performed at [facility], [address], [place], [place] [address]   Folate     Status: Abnormal   Collection Time: 06/10/23  6:10 AM  Result Value Ref Range   Folate 4.9 (L) >5.9 ng/mL    Comment: Performed at [facility], [address] [place], [place] [address]  Iron and TIBC     Status: Abnormal   Collection Time: 06/10/23  6:10 AM  Result Value Ref Range   Iron 30 (L) 45 - 182 ug/dL  TIBC 288 250 - 450 ug/dL   Saturation Ratios 10 (L) 17.9 - 39.5 %   UIBC 258 ug/dL    Comment: Performed at [facility], [address] [place], [place] [address]  Ferritin     Status: None   Collection Time: 06/10/23  6:10 AM  Result Value Ref Range   Ferritin 84 24 - 336 ng/mL    Comment: Performed at [facility], [address] [place], [place] [address]  Reticulocytes     Status: Abnormal   Collection Time: 06/10/23  6:10 AM  Result Value Ref Range   Retic Ct Pct 1.3 0.4 - 3.1 %   RBC. 4.19 (L) 4.22 - 5.81 MIL/uL   Retic Count, Absolute 55.3 19.0 - 186.0 K/uL   Immature Retic Fract 15.3 2.3 - 15.9 %    Comment: Performed at [facility], [address], [place], [place] [address]  CBC with Differential/Platelet     Status: Abnormal   Collection Time: 06/11/23  5:12 AM  Result Value Ref Range   WBC 5.4 4.0 - 10.5 K/uL   RBC 4.62 4.22 - 5.81 MIL/uL   Hemoglobin 13.2 13.0 - 17.0 g/dL   HCT 59.3 60.9 - 47.9 %   MCV 87.9 80.0 - 100.0 fL   MCH 28.6 26.0 -  34.0 pg   MCHC 32.5 30.0 - 36.0 g/dL   RDW 86.4 88.4 - 84.4 %   Platelets 286 150 - 400 K/uL   nRBC 0.0 0.0 - 0.2 %   Neutrophils Relative % 89 %   Neutro Abs 4.8 1.7 - 7.7 K/uL   Lymphocytes Relative 8 %   Lymphs Abs 0.4 (L) 0.7 - 4.0 K/uL   Monocytes Relative 3 %   Monocytes Absolute 0.2 0.1 - 1.0 K/uL   Eosinophils Relative 0 %   Eosinophils Absolute 0.0 0.0 - 0.5 K/uL   Basophils Relative 0 %   Basophils Absolute 0.0 0.0 - 0.1 K/uL   Immature Granulocytes 0 %   Abs Immature Granulocytes 0.01 0.00 - 0.07 K/uL    Comment: Performed at [facility], [address], [place], [place] [address]  Comprehensive metabolic panel     Status: Abnormal   Collection Time: 06/11/23  5:12 AM  Result Value Ref Range   Sodium 137 135 - 145 mmol/L   Potassium 4.1 3.5 - 5.1 mmol/L   Chloride 99 98 - 111 mmol/L   CO2 24 22 - 32 mmol/L   Glucose, Bld 262 (H) 70 - 99 mg/dL    Comment: Glucose reference range applies only to samples taken after fasting for at least 8 hours.   BUN 33 (H) 8 - 23 mg/dL   Creatinine, Ser 8.70 (H) 0.61 - 1.24 mg/dL   Calcium  8.8 (L) 8.9 - 10.3 mg/dL   Total Protein 6.7 6.5 - 8.1 g/dL   Albumin 2.6 (L) 3.5 - 5.0 g/dL   AST 60 (H) 15 - 41 U/L   ALT 34 0 - 44 U/L   Alkaline Phosphatase 411 (H) 38 - 126 U/L   Total Bilirubin 1.1 0.0 - 1.2 mg/dL   GFR, Estimated 54 (L) >60 mL/min    Comment: (NOTE) Calculated using the CKD-EPI Creatinine Equation (2021)    Anion gap 14 5 - 15    Comment: Performed at [facility], [address] [place], [place] [address]  Phosphorus     Status: Abnormal   Collection Time: 06/11/23  5:12 AM  Result Value Ref Range   Phosphorus 5.0 (H) 2.5 - 4.6 mg/dL    Comment: Performed at [facility], [address], [place], [place] [address]  Magnesium     Status: Abnormal   Collection Time: 06/11/23  5:12 AM  Result Value Ref Range   Magnesium 2.5 (H) 1.7 - 2.4 mg/dL    Comment:  Performed at [facility], [address] [place], [place] [address]  Brain natriuretic peptide     Status: Abnormal   Collection Time: 06/11/23  5:16 AM  Result Value Ref Range   B Natriuretic Peptide 446.9 (H) 0.0 - 100.0 pg/mL    Comment: Performed at [facility], [address] [place], [place] [address]    ECG   N/A  Telemetry   Sinus bradycardia - Personally Reviewed  Radiology   DG CHEST PORT 1 VIEW Result Date: 06/11/2023 CLINICAL DATA:  Community acquired pneumonia.  Shortness of breath. EXAM: PORTABLE CHEST 1 VIEW COMPARISON:  Chest radiograph dated 06/09/2023. FINDINGS: No significant interval change in the bilateral pulmonary opacities, left greater than right. No pleural effusion or pneumothorax. Stable cardiac silhouette no acute osseous pathology. IMPRESSION: No significant interval change in the bilateral pulmonary opacities, left greater than right. Electronically Signed   By: Vanetta Chou M.D.   On: 06/11/2023 10:21   CT Angio Chest Pulmonary Embolism (PE) W or WO Contrast Result Date: 06/10/2023 CLINICAL DATA:  Chronic dyspnea, neuroendocrine tumor with hepatic metastatic disease. * Tracking Code: BO * EXAM: CT ANGIOGRAPHY CHEST WITH CONTRAST TECHNIQUE: Multidetector CT imaging of the chest was performed using the standard protocol during bolus administration of intravenous contrast. Multiplanar CT image reconstructions and MIPs were obtained to evaluate the vascular anatomy. RADIATION DOSE REDUCTION: This exam was performed according to the departmental dose-optimization program which includes automated exposure control, adjustment of the mA and/or kV according to patient size and/or use of iterative reconstruction technique. CONTRAST:  75mL OMNIPAQUE  IOHEXOL  350 MG/ML SOLN COMPARISON:  PET-CT 04/16/2023 and CT chest 03/21/2023 FINDINGS: Despite efforts by the technologist and patient, motion artifact is present on today's exam  and could not be eliminated. This reduces exam sensitivity and specificity. Cardiovascular: No filling defect is identified in the pulmonary arterial tree to suggest pulmonary embolus. Coronary, aortic arch, and branch vessel atherosclerotic vascular disease. Mild cardiomegaly. Mediastinum/Nodes: Upper prevascular node 1.1 cm on image 36 series 4, mildly enlarged, previously measuring 0.8 cm. Left supraclavicular node 1.2 cm in short axis on image 30 series 4, formerly 1.0 cm. Mild right paratracheal adenopathy as well. Mildly dilated esophagus with air-fluid level. Lungs/Pleura: Bilateral airspace opacities favoring acute pulmonary edema. Small left pleural effusion. Upper Abdomen: Hepatomegaly. Heterogeneous density probably reflecting metastatic disease. Pseudo cirrhosis contour. Musculoskeletal: No acute bony findings. Review of the MIP images confirms the above findings. IMPRESSION: 1. No filling defect is identified in the pulmonary arterial tree to suggest pulmonary embolus. 2. Bilateral airspace opacities favoring acute pulmonary edema. Small left pleural effusion. 3. Mild cardiomegaly. 4. Hepatomegaly with heterogeneous density probably reflecting metastatic disease. Pseudocirrhosis contour. 5. Mildly enlarged mediastinal and left supraclavicular nodes, mildly increased in size from prior. Congestion as a potential cause. 6. Mildly dilated esophagus with air-fluid level. 7. Aortic atherosclerosis. Aortic Atherosclerosis (ICD10-I70.0). Electronically Signed   By: Ryan Salvage M.D.   On: 06/10/2023 17:07    Cardiac Studies   Echo reviewed  Impression   Principal Problem:   CAP (community acquired pneumonia) Active  Problems:   Acute respiratory failure with hypoxia (HCC)   Neuroendocrine tumor   Recommendation   Marinell developed acute on chronic dyspnea with high oxygen requirement this morning.  Etiology is unclear however chest x-ray shows pulmonary edema.  There is likely some element  of noncardiogenic pulmonary edema possibly related to pneumonitis.  Since the event occurred early this morning, question whether he might of had an aspiration event or possibly overnight apnea leading to his worsening pulmonary edema.  BNP today is actually higher than admission.  Ins and outs have not been accurately followed.  No active weight.  Will need to follow this closely.  Would advise additional Lasix  40 mg today.  Thanks for the consultation.  I will follow with you closely.  Time Spent Directly with Patient:  I have spent a total of 45 minutes with the patient reviewing hospital notes, telemetry, EKGs, labs and examining the patient as well as establishing an assessment and plan that was discussed personally with the patient.  > 50% of time was spent in direct patient care.  Length of Stay:  LOS: 3 days   Vinie KYM Maxcy, MD, [facility], FACP  [place]  [facility]  Medical Director of the Advanced Lipid Disorders &  Cardiovascular Risk Reduction Clinic Diplomate of the American Board of Clinical Lipidology Attending Cardiologist  Direct Dial: [phone]  Fax: [phone]  Website:  [email or web address] Vinie BROCKS Darcee Dekker 06/11/2023, 3:05 PM

## 2023-06-11 NOTE — Plan of Care (Signed)

## 2023-06-11 NOTE — Plan of Care (Signed)
  Problem: Education: Goal: Knowledge of General Education information will improve Description: Including pain rating scale, medication(s)/side effects and non-pharmacologic comfort measures Outcome: Progressing   Problem: Clinical Measurements: Goal: Will remain free from infection Outcome: Progressing   Problem: Pain Management: Goal: General experience of comfort will improve Outcome: Progressing

## 2023-06-12 ENCOUNTER — Inpatient Hospital Stay (HOSPITAL_COMMUNITY): Payer: Medicare Other

## 2023-06-12 DIAGNOSIS — J810 Acute pulmonary edema: Secondary | ICD-10-CM | POA: Diagnosis not present

## 2023-06-12 DIAGNOSIS — J189 Pneumonia, unspecified organism: Secondary | ICD-10-CM | POA: Diagnosis not present

## 2023-06-12 DIAGNOSIS — J9601 Acute respiratory failure with hypoxia: Secondary | ICD-10-CM | POA: Diagnosis not present

## 2023-06-12 DIAGNOSIS — D3A8 Other benign neuroendocrine tumors: Secondary | ICD-10-CM | POA: Diagnosis not present

## 2023-06-12 LAB — CBC WITH DIFFERENTIAL/PLATELET
Abs Immature Granulocytes: 0.04 10*3/uL (ref 0.00–0.07)
Basophils Absolute: 0 10*3/uL (ref 0.0–0.1)
Basophils Relative: 0 %
Eosinophils Absolute: 0 10*3/uL (ref 0.0–0.5)
Eosinophils Relative: 0 %
HCT: 39.5 % (ref 39.0–52.0)
Hemoglobin: 12.8 g/dL — ABNORMAL LOW (ref 13.0–17.0)
Immature Granulocytes: 0 %
Lymphocytes Relative: 3 %
Lymphs Abs: 0.4 10*3/uL — ABNORMAL LOW (ref 0.7–4.0)
MCH: 28.5 pg (ref 26.0–34.0)
MCHC: 32.4 g/dL (ref 30.0–36.0)
MCV: 88 fL (ref 80.0–100.0)
Monocytes Absolute: 0.6 10*3/uL (ref 0.1–1.0)
Monocytes Relative: 5 %
Neutro Abs: 11.4 10*3/uL — ABNORMAL HIGH (ref 1.7–7.7)
Neutrophils Relative %: 92 %
Platelets: 358 10*3/uL (ref 150–400)
RBC: 4.49 MIL/uL (ref 4.22–5.81)
RDW: 13.4 % (ref 11.5–15.5)
WBC: 12.5 10*3/uL — ABNORMAL HIGH (ref 4.0–10.5)
nRBC: 0 % (ref 0.0–0.2)

## 2023-06-12 LAB — COMPREHENSIVE METABOLIC PANEL
ALT: 33 U/L (ref 0–44)
AST: 51 U/L — ABNORMAL HIGH (ref 15–41)
Albumin: 2.6 g/dL — ABNORMAL LOW (ref 3.5–5.0)
Alkaline Phosphatase: 372 U/L — ABNORMAL HIGH (ref 38–126)
Anion gap: 11 (ref 5–15)
BUN: 44 mg/dL — ABNORMAL HIGH (ref 8–23)
CO2: 25 mmol/L (ref 22–32)
Calcium: 8.7 mg/dL — ABNORMAL LOW (ref 8.9–10.3)
Chloride: 96 mmol/L — ABNORMAL LOW (ref 98–111)
Creatinine, Ser: 1.16 mg/dL (ref 0.61–1.24)
GFR, Estimated: >60 mL/min (ref >60)
Glucose, Bld: 351 mg/dL — ABNORMAL HIGH (ref 70–99)
Potassium: 4 mmol/L (ref 3.5–5.1)
Sodium: 132 mmol/L — ABNORMAL LOW (ref 135–145)
Total Bilirubin: 1 mg/dL (ref 0.0–1.2)
Total Protein: 6.4 g/dL — ABNORMAL LOW (ref 6.5–8.1)

## 2023-06-12 LAB — GLUCOSE, CAPILLARY
Glucose-Capillary: 437 mg/dL — ABNORMAL HIGH (ref 70–99)
Glucose-Capillary: 355 mg/dL — ABNORMAL HIGH (ref 70–99)
Glucose-Capillary: 457 mg/dL — ABNORMAL HIGH (ref 70–99)
Glucose-Capillary: 283 mg/dL — ABNORMAL HIGH (ref 70–99)
Glucose-Capillary: 249 mg/dL — ABNORMAL HIGH (ref 70–99)

## 2023-06-12 LAB — BLOOD GAS, ARTERIAL
Acid-Base Excess: 0.8 mmol/L (ref 0.0–2.0)
Bicarbonate: 25.3 mmol/L (ref 20.0–28.0)
Drawn by: 31394
O2 Content: 15 L/min
O2 Saturation: 90.3 %
Patient temperature: 36.2
pCO2 arterial: 38 mm[Hg] (ref 32–48)
pH, Arterial: 7.43 (ref 7.35–7.45)
pO2, Arterial: 57 mm[Hg] — ABNORMAL LOW (ref 83–108)

## 2023-06-12 LAB — CULTURE, BLOOD (ROUTINE X 2)
Culture: NO GROWTH
Special Requests: ADEQUATE

## 2023-06-12 LAB — C-REACTIVE PROTEIN: CRP: 4.8 mg/dL — ABNORMAL HIGH (ref <1.0)

## 2023-06-12 LAB — SEDIMENTATION RATE: Sed Rate: 5 mm/h (ref 0–16)

## 2023-06-12 LAB — MRSA NEXT GEN BY PCR, NASAL: MRSA by PCR Next Gen: NOT DETECTED

## 2023-06-12 LAB — HEMOGLOBIN A1C
Hgb A1c MFr Bld: 7.9 % — ABNORMAL HIGH (ref 4.8–5.6)
Mean Plasma Glucose: 180.03 mg/dL

## 2023-06-12 LAB — LACTATE DEHYDROGENASE: LDH: 411 U/L — ABNORMAL HIGH (ref 98–192)

## 2023-06-12 LAB — PHOSPHORUS: Phosphorus: 4 mg/dL (ref 2.5–4.6)

## 2023-06-12 LAB — BRAIN NATRIURETIC PEPTIDE: B Natriuretic Peptide: 243.2 pg/mL — ABNORMAL HIGH (ref 0.0–100.0)

## 2023-06-12 LAB — MAGNESIUM: Magnesium: 2.8 mg/dL — ABNORMAL HIGH (ref 1.7–2.4)

## 2023-06-12 MED ORDER — INSULIN ASPART 100 UNIT/ML IJ SOLN
4.0000 [IU] | Freq: Three times a day (TID) | INTRAMUSCULAR | Status: DC
  Administered 2023-06-12 – 2023-06-15 (×6): 4 [IU] via SUBCUTANEOUS

## 2023-06-12 MED ORDER — VANCOMYCIN HCL 2000 MG/400ML IV SOLN
2000.0000 mg | Freq: Once | INTRAVENOUS | Status: AC
Start: 2023-06-12 — End: 2023-06-12
  Administered 2023-06-12: 2000 mg via INTRAVENOUS
  Filled 2023-06-12: qty 400

## 2023-06-12 MED ORDER — SULFAMETHOXAZOLE-TRIMETHOPRIM 400-80 MG/5ML IV SOLN: 15.0000 mg/kg/d | Freq: Three times a day (TID) | INTRAVENOUS | Status: DC

## 2023-06-12 MED ORDER — FUROSEMIDE 10 MG/ML IJ SOLN
40.0000 mg | INTRAMUSCULAR | Status: AC
  Administered 2023-06-12: 40 mg via INTRAVENOUS
  Filled 2023-06-12: qty 4

## 2023-06-12 MED ORDER — INSULIN GLARGINE-YFGN 100 UNIT/ML ~~LOC~~ SOLN
10.0000 [IU] | Freq: Every day | SUBCUTANEOUS | Status: DC
  Administered 2023-06-12 – 2023-06-18 (×6): 10 [IU] via SUBCUTANEOUS
  Filled 2023-06-12 (×7): qty 0.1

## 2023-06-12 MED ORDER — FUROSEMIDE 10 MG/ML IJ SOLN: 40.0000 mg | Freq: Once | INTRAMUSCULAR | Status: DC

## 2023-06-12 MED ORDER — VANCOMYCIN HCL 1.5 G IV SOLR: 1500.0000 mg | INTRAVENOUS | Status: DC

## 2023-06-12 MED ORDER — CHLORHEXIDINE GLUCONATE CLOTH 2 % EX PADS
6.0000 | MEDICATED_PAD | Freq: Every day | CUTANEOUS | Status: DC
  Administered 2023-06-12 – 2023-06-16 (×5): 6 via TOPICAL

## 2023-06-12 MED ORDER — INSULIN ASPART 100 UNIT/ML IJ SOLN
10.0000 [IU] | Freq: Once | INTRAMUSCULAR | Status: AC
  Administered 2023-06-12: 10 [IU] via SUBCUTANEOUS

## 2023-06-12 MED ORDER — INSULIN ASPART 100 UNIT/ML IJ SOLN
0.0000 [IU] | Freq: Every day | INTRAMUSCULAR | Status: DC
  Administered 2023-06-12 – 2023-06-15 (×2): 2 [IU] via SUBCUTANEOUS

## 2023-06-12 MED ORDER — SULFAMETHOXAZOLE-TRIMETHOPRIM 800-160 MG PO TABS
2.0000 | ORAL_TABLET | Freq: Three times a day (TID) | ORAL | Status: DC
Start: 2023-06-12 — End: 2023-06-19
  Administered 2023-06-12 – 2023-06-18 (×18): 2 via ORAL
  Filled 2023-06-12 (×20): qty 2

## 2023-06-12 MED ORDER — INSULIN ASPART 100 UNIT/ML IJ SOLN: 4.0000 [IU] | Freq: Three times a day (TID) | INTRAMUSCULAR | Status: DC

## 2023-06-12 MED ORDER — INSULIN ASPART 100 UNIT/ML IJ SOLN
0.0000 [IU] | Freq: Every day | INTRAMUSCULAR | Status: DC
Start: 2023-06-12 — End: 2023-06-12

## 2023-06-12 MED ORDER — INSULIN ASPART 100 UNIT/ML IJ SOLN
0.0000 [IU] | Freq: Three times a day (TID) | INTRAMUSCULAR | Status: DC
Start: 2023-06-12 — End: 2023-06-12

## 2023-06-12 MED ORDER — INSULIN ASPART 100 UNIT/ML IJ SOLN: 0.0000 [IU] | Freq: Every day | INTRAMUSCULAR | Status: DC

## 2023-06-12 MED ORDER — INSULIN ASPART 100 UNIT/ML IJ SOLN
0.0000 [IU] | Freq: Three times a day (TID) | INTRAMUSCULAR | Status: DC
Start: 2023-06-13 — End: 2023-06-12

## 2023-06-12 MED ORDER — INSULIN ASPART 100 UNIT/ML IJ SOLN
0.0000 [IU] | Freq: Three times a day (TID) | INTRAMUSCULAR | Status: DC
  Administered 2023-06-12: 20 [IU] via SUBCUTANEOUS
  Administered 2023-06-13: 4 [IU] via SUBCUTANEOUS
  Administered 2023-06-13: 15 [IU] via SUBCUTANEOUS
  Administered 2023-06-13: 7 [IU] via SUBCUTANEOUS
  Administered 2023-06-14: 15 [IU] via SUBCUTANEOUS
  Administered 2023-06-14 – 2023-06-15 (×2): 4 [IU] via SUBCUTANEOUS
  Administered 2023-06-16: 3 [IU] via SUBCUTANEOUS
  Administered 2023-06-18: 4 [IU] via SUBCUTANEOUS
  Administered 2023-06-18: 3 [IU] via SUBCUTANEOUS

## 2023-06-12 MED ORDER — CEFEPIME HCL 2 G IV SOLR
2.0000 g | Freq: Two times a day (BID) | INTRAVENOUS | Status: DC
  Administered 2023-06-12 – 2023-06-18 (×12): 2 g via INTRAVENOUS
  Filled 2023-06-12 (×12): qty 12.5

## 2023-06-12 NOTE — Plan of Care (Signed)
  Problem: Clinical Measurements: Goal: Ability to maintain clinical measurements within normal limits will improve Outcome: Progressing Goal: Diagnostic test results will improve Outcome: Progressing   Problem: Activity: Goal: Risk for activity intolerance will decrease Outcome: Progressing   Problem: Nutrition: Goal: Adequate nutrition will be maintained Outcome: Progressing   Problem: Pain Management: Goal: General experience of comfort will improve Outcome: Progressing   Problem: Nutritional: Goal: Maintenance of adequate nutrition will improve Outcome: Progressing   Problem: Skin Integrity: Goal: Risk for impaired skin integrity will decrease Outcome: Progressing

## 2023-06-12 NOTE — Progress Notes (Signed)
 PCCM Update:  Patient transferred to step down unit for significant increase in oxygen needs. He is now on HHFNC.   Antibiotics have been broadended. MRSA screen is negative, can discontinue vancomycin .  Continue with cefepime  and bactrim . Bactrim  added for empiric pneumocystis coverage.  Dorn Chill, MD [facility] Office: [phone]   See Amion for personal pager PCCM on call pager [phone] until 7pm. Please call Elink 7p-7a. [phone]

## 2023-06-12 NOTE — Progress Notes (Signed)
 DAILY PROGRESS NOTE   Patient Name: Corey [name] Date of Encounter: 06/12/2023 Cardiologist: Vinie JAYSON Maxcy, MD  Chief Complaint   Short of breath  Patient Profile   89 year old male patient of mine with a history of atrial fibrillation, hypertension and coronary artery disease and recent low normal LVEF, on treatment for neuroendocrine tumor, who presented with acute diastolic heart failure and pneumonia. After initial improvement he developed acute shortness of breath requiring additional diuresis. Cardiology is asked to consult for recommendations.   Subjective   Still requiring high dose oxygen - now on 12L . Seen again by PCCM today- plan for steroid taper. Given lasix  yesterday - was net negative 505 recorded. Weight up 2.5 kg? May not be accurate. BNP 243 today (from 446). Creatinine better at 1.16. ESR negative. CXR appears stable.  Objective   Vitals:   06/11/23 1600 06/11/23 1946 06/12/23 0532 06/12/23 0938  BP:  109/66 115/66   Pulse:  72 71   Resp:  19 16   Temp:  (!) 97.5 F (36.4 C) (!) 97.5 F (36.4 C)   TempSrc:  Oral Oral   SpO2:  91% 92% (!) 88%  Weight: 83.8 kg  86.4 kg   Height:        Intake/Output Summary (Last 24 hours) at 06/12/2023 1131 Last data filed at 06/12/2023 9193 Gross per 24 hour  Intake 355 ml  Output 625 ml  Net -270 ml   Filed Weights   06/07/23 1628 06/11/23 1600 06/12/23 0532  Weight: 86.7 kg 83.8 kg 86.4 kg    Physical Exam   General appearance: alert and moderate distress Neck: no carotid bruit, no JVD, and thyroid  not enlarged, symmetric, no tenderness/mass/nodules Lungs: diminished breath sounds bibasilar Heart: regular rate and rhythm Abdomen: soft, non-tender; bowel sounds normal; no masses,  no organomegaly Extremities: extremities normal, atraumatic, no cyanosis or edema Pulses: 2+ and symmetric Skin: Skin color, texture, turgor normal. No rashes or lesions Neurologic: Grossly normal Psych:  Pleasant  Inpatient Medications    Scheduled Meds:  apixaban   5 mg Oral BID   atorvastatin   20 mg Oral Daily   budesonide  (PULMICORT ) nebulizer solution  0.25 mg Nebulization BID   buPROPion   150 mg Oral q morning   busPIRone   5 mg Oral QHS   feeding supplement  237 mL Oral BID BM   flecainide   100 mg Oral BID   folic acid   1 mg Oral Daily   guaiFENesin   1,200 mg Oral BID   ipratropium  0.5 mg Nebulization TID   irbesartan   300 mg Oral Daily   levalbuterol   0.63 mg Nebulization TID   methylPREDNISolone  (SOLU-MEDROL ) injection  125 mg Intravenous Q12H   mirtazapine   15 mg Oral QHS   pantoprazole   40 mg Oral Daily    Continuous Infusions:  cefTRIAXone  (ROCEPHIN )  IV 2 g (06/11/23 1233)    PRN Meds: acetaminophen  **OR** acetaminophen , albuterol , ondansetron  **OR** ondansetron  (ZOFRAN ) IV, traZODone    Labs   Results for orders placed or performed during the hospital encounter of 06/07/23 (from the past 48 hours)  CBC with Differential/Platelet     Status: Abnormal   Collection Time: 06/11/23  5:12 AM  Result Value Ref Range   WBC 5.4 4.0 - 10.5 K/uL   RBC 4.62 4.22 - 5.81 MIL/uL   Hemoglobin 13.2 13.0 - 17.0 g/dL   HCT 59.3 60.9 - 47.9 %   MCV 87.9 80.0 - 100.0 fL   MCH 28.6 26.0 -  34.0 pg   MCHC 32.5 30.0 - 36.0 g/dL   RDW 86.4 88.4 - 84.4 %   Platelets 286 150 - 400 K/uL   nRBC 0.0 0.0 - 0.2 %   Neutrophils Relative % 89 %   Neutro Abs 4.8 1.7 - 7.7 K/uL   Lymphocytes Relative 8 %   Lymphs Abs 0.4 (L) 0.7 - 4.0 K/uL   Monocytes Relative 3 %   Monocytes Absolute 0.2 0.1 - 1.0 K/uL   Eosinophils Relative 0 %   Eosinophils Absolute 0.0 0.0 - 0.5 K/uL   Basophils Relative 0 %   Basophils Absolute 0.0 0.0 - 0.1 K/uL   Immature Granulocytes 0 %   Abs Immature Granulocytes 0.01 0.00 - 0.07 K/uL    Comment: Performed at [facility], [address] [place], [place] [address]  Comprehensive metabolic panel     Status: Abnormal   Collection Time:  06/11/23  5:12 AM  Result Value Ref Range   Sodium 137 135 - 145 mmol/L   Potassium 4.1 3.5 - 5.1 mmol/L   Chloride 99 98 - 111 mmol/L   CO2 24 22 - 32 mmol/L   Glucose, Bld 262 (H) 70 - 99 mg/dL    Comment: Glucose reference range applies only to samples taken after fasting for at least 8 hours.   BUN 33 (H) 8 - 23 mg/dL   Creatinine, Ser 8.70 (H) 0.61 - 1.24 mg/dL   Calcium  8.8 (L) 8.9 - 10.3 mg/dL   Total Protein 6.7 6.5 - 8.1 g/dL   Albumin 2.6 (L) 3.5 - 5.0 g/dL   AST 60 (H) 15 - 41 U/L   ALT 34 0 - 44 U/L   Alkaline Phosphatase 411 (H) 38 - 126 U/L   Total Bilirubin 1.1 0.0 - 1.2 mg/dL   GFR, Estimated 54 (L) >60 mL/min    Comment: (NOTE) Calculated using the CKD-EPI Creatinine Equation (2021)    Anion gap 14 5 - 15    Comment: Performed at [facility], [address] [place], [place] [address]  Phosphorus     Status: Abnormal   Collection Time: 06/11/23  5:12 AM  Result Value Ref Range   Phosphorus 5.0 (H) 2.5 - 4.6 mg/dL    Comment: Performed at [facility], [address], [place], [place] [address]  Magnesium     Status: Abnormal   Collection Time: 06/11/23  5:12 AM  Result Value Ref Range   Magnesium 2.5 (H) 1.7 - 2.4 mg/dL    Comment: Performed at [facility], [address] [place], [place] [address]  Brain natriuretic peptide     Status: Abnormal   Collection Time: 06/11/23  5:16 AM  Result Value Ref Range   B Natriuretic Peptide 446.9 (H) 0.0 - 100.0 pg/mL    Comment: Performed at [facility], [address], [place], [place] [address]  CBC with Differential/Platelet     Status: Abnormal   Collection Time: 06/12/23  5:35 AM  Result Value Ref Range   WBC 12.5 (H) 4.0 - 10.5 K/uL   RBC 4.49 4.22 - 5.81 MIL/uL   Hemoglobin 12.8 (L) 13.0 - 17.0 g/dL   HCT 60.4 60.9 - 47.9 %   MCV 88.0 80.0 - 100.0 fL   MCH 28.5 26.0 - 34.0 pg   MCHC 32.4 30.0 - 36.0 g/dL   RDW 86.5 88.4 - 84.4  %   Platelets 358 150 - 400 K/uL  nRBC 0.0 0.0 - 0.2 %   Neutrophils Relative % 92 %   Neutro Abs 11.4 (H) 1.7 - 7.7 K/uL   Lymphocytes Relative 3 %   Lymphs Abs 0.4 (L) 0.7 - 4.0 K/uL   Monocytes Relative 5 %   Monocytes Absolute 0.6 0.1 - 1.0 K/uL   Eosinophils Relative 0 %   Eosinophils Absolute 0.0 0.0 - 0.5 K/uL   Basophils Relative 0 %   Basophils Absolute 0.0 0.0 - 0.1 K/uL   Immature Granulocytes 0 %   Abs Immature Granulocytes 0.04 0.00 - 0.07 K/uL    Comment: Performed at [facility], [address] [place], [place] [address]  Comprehensive metabolic panel     Status: Abnormal   Collection Time: 06/12/23  5:35 AM  Result Value Ref Range   Sodium 132 (L) 135 - 145 mmol/L   Potassium 4.0 3.5 - 5.1 mmol/L   Chloride 96 (L) 98 - 111 mmol/L   CO2 25 22 - 32 mmol/L   Glucose, Bld 351 (H) 70 - 99 mg/dL    Comment: Glucose reference range applies only to samples taken after fasting for at least 8 hours.   BUN 44 (H) 8 - 23 mg/dL   Creatinine, Ser 8.83 0.61 - 1.24 mg/dL   Calcium  8.7 (L) 8.9 - 10.3 mg/dL   Total Protein 6.4 (L) 6.5 - 8.1 g/dL   Albumin 2.6 (L) 3.5 - 5.0 g/dL   AST 51 (H) 15 - 41 U/L   ALT 33 0 - 44 U/L   Alkaline Phosphatase 372 (H) 38 - 126 U/L   Total Bilirubin 1.0 0.0 - 1.2 mg/dL   GFR, Estimated >39 >39 mL/min    Comment: (NOTE) Calculated using the CKD-EPI Creatinine Equation (2021)    Anion gap 11 5 - 15    Comment: Performed at [facility], [address] [place], [place] [address]  Phosphorus     Status: None   Collection Time: 06/12/23  5:35 AM  Result Value Ref Range   Phosphorus 4.0 2.5 - 4.6 mg/dL    Comment: Performed at [facility], [address] [place], [place] [address]  Magnesium     Status: Abnormal   Collection Time: 06/12/23  5:35 AM  Result Value Ref Range   Magnesium 2.8 (H) 1.7 - 2.4 mg/dL    Comment: Performed at [facility], [address], [place], [place] [address]  Lactate dehydrogenase     Status: Abnormal   Collection Time: 06/12/23  9:42 AM  Result Value Ref Range   LDH 411 (H) 98 - 192 U/L    Comment: Performed at [facility], [address] [place], [place] [address]  Brain natriuretic peptide     Status: Abnormal   Collection Time: 06/12/23  9:42 AM  Result Value Ref Range   B Natriuretic Peptide 243.2 (H) 0.0 - 100.0 pg/mL    Comment: Performed at [facility], [address] [place], [place] [address]    ECG   N/A  Telemetry   Sinus rhythm - Personally Reviewed  Radiology    DG CHEST PORT 1 VIEW Result Date: 06/12/2023 CLINICAL DATA:  Shortness of breath EXAM: PORTABLE CHEST 1 VIEW COMPARISON:  06/11/2023 FINDINGS: Cardiomediastinal contours are stable. Aortic atherosclerosis. Bilateral airspace disease, left greater than right, similar to prior study. No visible effusions or pneumothorax. No acute bony abnormality. IMPRESSION: Stable left greater than right bilateral  airspace opacities. Electronically Signed   By: Franky Crease M.D.   On: 06/12/2023 09:30   DG CHEST PORT 1 VIEW Result Date: 06/11/2023 CLINICAL DATA:  Community acquired pneumonia.  Shortness of breath. EXAM: PORTABLE CHEST 1 VIEW COMPARISON:  Chest radiograph dated 06/09/2023. FINDINGS: No significant interval change in the bilateral pulmonary opacities, left greater than right. No pleural effusion or pneumothorax. Stable cardiac silhouette no acute osseous pathology. IMPRESSION: No significant interval change in the bilateral pulmonary opacities, left greater than right. Electronically Signed   By: Vanetta Chou M.D.   On: 06/11/2023 10:21   CT Angio Chest Pulmonary Embolism (PE) W or WO Contrast Result Date: 06/10/2023 CLINICAL DATA:  Chronic dyspnea, neuroendocrine tumor with hepatic metastatic disease. * Tracking Code: BO * EXAM: CT ANGIOGRAPHY CHEST WITH CONTRAST TECHNIQUE: Multidetector CT imaging  of the chest was performed using the standard protocol during bolus administration of intravenous contrast. Multiplanar CT image reconstructions and MIPs were obtained to evaluate the vascular anatomy. RADIATION DOSE REDUCTION: This exam was performed according to the departmental dose-optimization program which includes automated exposure control, adjustment of the mA and/or kV according to patient size and/or use of iterative reconstruction technique. CONTRAST:  75mL OMNIPAQUE  IOHEXOL  350 MG/ML SOLN COMPARISON:  PET-CT 04/16/2023 and CT chest 03/21/2023 FINDINGS: Despite efforts by the technologist and patient, motion artifact is present on today's exam and could not be eliminated. This reduces exam sensitivity and specificity. Cardiovascular: No filling defect is identified in the pulmonary arterial tree to suggest pulmonary embolus. Coronary, aortic arch, and branch vessel atherosclerotic vascular disease. Mild cardiomegaly. Mediastinum/Nodes: Upper prevascular node 1.1 cm on image 36 series 4, mildly enlarged, previously measuring 0.8 cm. Left supraclavicular node 1.2 cm in short axis on image 30 series 4, formerly 1.0 cm. Mild right paratracheal adenopathy as well. Mildly dilated esophagus with air-fluid level. Lungs/Pleura: Bilateral airspace opacities favoring acute pulmonary edema. Small left pleural effusion. Upper Abdomen: Hepatomegaly. Heterogeneous density probably reflecting metastatic disease. Pseudo cirrhosis contour. Musculoskeletal: No acute bony findings. Review of the MIP images confirms the above findings. IMPRESSION: 1. No filling defect is identified in the pulmonary arterial tree to suggest pulmonary embolus. 2. Bilateral airspace opacities favoring acute pulmonary edema. Small left pleural effusion. 3. Mild cardiomegaly. 4. Hepatomegaly with heterogeneous density probably reflecting metastatic disease. Pseudocirrhosis contour. 5. Mildly enlarged mediastinal and left supraclavicular nodes,  mildly increased in size from prior. Congestion as a potential cause. 6. Mildly dilated esophagus with air-fluid level. 7. Aortic atherosclerosis. Aortic Atherosclerosis (ICD10-I70.0). Electronically Signed   By: Ryan Salvage M.D.   On: 06/10/2023 17:07    Cardiac Studies   N/A  Assessment   Principal Problem:   CAP (community acquired pneumonia) Active Problems:   Acute respiratory failure with hypoxia (HCC)   Neuroendocrine tumor   Plan   Worsening oxygen requirement-  had some diuresis yesterday, BNP improved by 50% - will give additional lasix  now. However, given his increasing oxygen needs, this is not likely due to pulmonary edema - suspect parenchymal issue, such as inflammation or possible infection. ESR normal - may need to consider hi-res CT. Pulmonary evaluating. I think he will shortly require hi-flow or possible ventilation. He expressed his desire to remain FULL CODE.  Time Spent Directly with Patient:  I have spent a total of 35 minutes with the patient reviewing hospital notes, telemetry, EKGs, labs and examining the patient as well as establishing an assessment and plan that was discussed personally with the patient.  >  50% of time was spent in direct patient care.  Length of Stay:  LOS: 4 days   Vinie KYM Maxcy, MD, [facility], FACP  [place]  Medical Director of the [facility] Lip[facility] &  Cardiovascular Risk Reduction Clinic Diplomate of the American Board of Clinical Lipidology Attending Cardiologist  Direct Dial: [phone]  Fax: [phone]  Website:  [email or web address] Vinie BROCKS Jacquese Hackman 06/12/2023, 11:31 AM

## 2023-06-12 NOTE — Consult Note (Signed)
 NAME:  Corey [name], MRN:  [id number], DOB:  Jul 02, 1934, LOS: 4 ADMISSION DATE:  06/07/2023, CONSULTATION DATE: 06/10/2023 REFERRING MD: Dr. Sherrill, CHIEF COMPLAINT: Acute hypoxemic respiratory failure  History of Present Illness:  89 year old man with a history of former tobacco, newly diagnosed metastatic neuroendocrine tumor followed by Dr. Lanny.  Also with atrial fibrillation (now NSR), hypertension, CAD with a reassuring cardiac stress test 2023.  EF 51%.  For his neuroendocrine tumor he has been treated initially with Lutathera (stopped for transaminitis) and then everolimus  for 3 weeks (stopped 1/6).  At that visit 1/6 he was found to be hypoxemic and had patchy bilateral pulmonary infiltrates on chest x-ray.  Subsequently seen by Dr. Mona with cardiology and started on furosemide  for possible component of pulmonary edema.  Now admitted 06/07/2023 with progressive dyspnea and cough.  He was started on azithromycin  and ceftriaxone  for possible community-acquired pneumonia.  Chest x-ray at the time of admission shows progression of left-sided hazy pulmonary infiltrates.  Pertinent  Medical History   Past Medical History:  Diagnosis Date   Basal cell carcinoma of auricle of right ear    BPH (benign prostatic hyperplasia)    DJD (degenerative joint disease)    left   ED (erectile dysfunction)    Fatigue    Hearing loss, sensorineural    Hx of adenomatous colonic polyps    Hx of major depression    Hypertension    Iron deficiency anemia    Nephrolithiasis    Paroxysmal atrial fibrillation (HCC)    Seasonal allergic rhinitis   Metastatic neuroendocrine tumor Atrial fibrillation   Significant Hospital Events: Including procedures, antibiotic start and stop dates in addition to other pertinent events   1/14 increase in O2 requirements overnight  Interim History / Subjective:  Feeling okay Increasing O2 requirements overnight  Objective   Blood pressure 115/66, pulse 71,  temperature (!) 97.5 F (36.4 C), temperature source Oral, resp. rate 16, height 6' 1 (1.854 m), weight 83.8 kg, SpO2 92%.        Intake/Output Summary (Last 24 hours) at 06/12/2023 9364 Last data filed at 06/12/2023 0304 Gross per 24 hour  Intake 120 ml  Output 625 ml  Net -505 ml   Filed Weights   06/07/23 1628 06/11/23 1600  Weight: 86.7 kg 83.8 kg    Examination: General: Elderly man laying in bed in no distress on nasal cannula oxygen HENT: Oropharynx clear, strong voice, no secretions, no stridor Lungs: bronchial breath sounds, no wheezing Cardiovascular: Regular, distant, no murmur Abdomen: Nondistended with positive bowel sounds Extremities: No peripheral edema Neuro: Awake, alert, interacting appropriately, follows commands GU: Deferred  Resolved Hospital Problem list     Assessment & Plan:  Acute hypoxemic respiratory failure with pulmonary infiltrates.  Differential diagnosis includes pneumonia although WBC reassuring, pneumonitis due to everolimus  (just stopped recently), asymmetric cardiogenic pulmonary edema.  PE would seem very unlikely with pulmonary infiltrates and also on apixaban  as an outpatient -Continue ceftriaxone  and azithromycin  - check MRSA screen -Check ESR, CRP and BNP today -Continue Solu-Medrol  125 mg IV BID. Will plan for extended steroid taper with prednisone  40-60mg  for a taper.  Not really in a position given his oxygen needs to consider bronchoscopy for culture, cell count -Diuresis as able -Follow serial chest x-ray  Blood culture Staph hominis 1 of 4 -Unclear significance, suspect contaminant -Currently covered with ceftriaxone   Hypertension Paroxysmal atrial fibrillation HFpEF -Diuretics as able   Metastatic neuroendocrine tumor -Appreciate Dr. Demetra assistance  Hyponatremia Hypokalemia    Labs   CBC: Recent Labs  Lab 06/07/23 0909 06/08/23 0546 06/09/23 0841 06/10/23 0610 06/11/23 0512 06/12/23 0535  WBC 6.4 5.4  7.2 7.9 5.4 12.5*  NEUTROABS 4.9  --  5.1 5.5 4.8 11.4*  HGB 13.5 12.0* 12.3* 12.3* 13.2 12.8*  HCT 39.0 35.8* 38.1* 36.1* 40.6 39.5  MCV 86.9 87.7 89.2 88.7 87.9 88.0  PLT 151 162 202 219 286 358    Basic Metabolic Panel: Recent Labs  Lab 06/08/23 0546 06/09/23 0841 06/10/23 0610 06/11/23 0512 06/12/23 0535  NA 132* 134* 136 137 132*  K 3.2* 3.8 4.2 4.1 4.0  CL 98 99 97* 99 96*  CO2 25 24 29 24 25   GLUCOSE 119* 193* 124* 262* 351*  BUN 15 17 21  33* 44*  CREATININE 0.95 0.94 1.01 1.29* 1.16  CALCIUM  7.9* 8.4* 8.5* 8.8* 8.7*  MG  --  2.2 2.3 2.5* 2.8*  PHOS  --  2.8 3.3 5.0* 4.0   GFR: Estimated Creatinine Clearance: 51.7 mL/min (by C-G formula based on SCr of 1.16 mg/dL). Recent Labs  Lab 06/09/23 0841 06/10/23 0610 06/11/23 0512 06/12/23 0535  WBC 7.2 7.9 5.4 12.5*    Liver Function Tests: Recent Labs  Lab 06/08/23 0546 06/09/23 0841 06/10/23 0610 06/11/23 0512 06/12/23 0535  AST 41 47* 51* 60* 51*  ALT 30 32 30 34 33  ALKPHOS 370* 383* 378* 411* 372*  BILITOT 1.3* 1.0 1.1 1.1 1.0  PROT 5.8* 6.1* 6.1* 6.7 6.4*  ALBUMIN 2.4* 2.5* 2.5* 2.6* 2.6*   Recent Labs  Lab 06/07/23 0909  LIPASE 38   No results for input(s): AMMONIA in the last 168 hours.  ABG No results found for: PHART, PCO2ART, PO2ART, HCO3, TCO2, ACIDBASEDEF, O2SAT   Coagulation Profile: No results for input(s): INR, PROTIME in the last 168 hours.  Cardiac Enzymes: No results for input(s): CKTOTAL, CKMB, CKMBINDEX, TROPONINI in the last 168 hours.  HbA1C: No results found for: HGBA1C  CBG: No results for input(s): GLUCAP in the last 168 hours.  Review of Systems:   As per HPI  Past Medical History:  He,  has a past medical history of Basal cell carcinoma of auricle of right ear, BPH (benign prostatic hyperplasia), DJD (degenerative joint disease), ED (erectile dysfunction), Fatigue, Hearing loss, sensorineural, adenomatous colonic polyps, major  depression, Hypertension, Iron deficiency anemia, Nephrolithiasis, Paroxysmal atrial fibrillation (HCC), and Seasonal allergic rhinitis.   Surgical History:   Past Surgical History:  Procedure Laterality Date   CARDIOVERSION N/A 08/13/2015   Procedure: CARDIOVERSION;  Surgeon: Vinie JAYSON Maxcy, MD;  Location: [facility] ENDOSCOPY;  Service: Cardiovascular;  Laterality: N/A;   CARDIOVERSION N/A 04/04/2016   Procedure: CARDIOVERSION;  Surgeon: Wilbert JONELLE Bihari, MD;  Location: [facility] ENDOSCOPY;  Service: Cardiovascular;  Laterality: N/A;   CARDIOVERSION N/A 11/12/2017   Procedure: CARDIOVERSION;  Surgeon: Maxcy Vinie JAYSON, MD;  Location: [facility] ENDOSCOPY;  Service: Cardiovascular;  Laterality: N/A;   CHOLECYSTECTOMY     colonscopy     herniorraphy Bilateral    had surgery x2 on the left  and right   TONSILLECTOMY AND ADENOIDECTOMY       Social History:   reports that he quit smoking about 62 years ago. His smoking use included cigarettes. He started smoking about 64 years ago. He has a 1.5 pack-year smoking history. He has never used smokeless tobacco. He reports that he does not drink alcohol  and does not use drugs.   Family History:  His family  history includes Alcoholism in his daughter; Arrhythmia in his daughter; CAD in his mother; Cancer - Prostate in his brother; Healthy in his daughter and son; Heart attack in his father; Heart disease in his mother.   Allergies No Known Allergies   Home Medications  Prior to Admission medications   Medication Sig Start Date End Date Taking? Authorizing Provider  amLODipine  (NORVASC ) 2.5 MG tablet Take 1 tablet (2.5 mg total) by mouth daily. 09/18/22  Yes Hilty, Vinie BROCKS, MD  apixaban  (ELIQUIS ) 5 MG TABS tablet Take 5 mg by mouth 2 (two) times daily. 07/14/15  Yes [provider]  atorvastatin  (LIPITOR) 20 MG tablet Take 20 mg by mouth daily.   Yes [provider]  buPROPion  (WELLBUTRIN  XL) 150 MG 24 hr tablet Take 150 mg by mouth every morning.  05/13/23  Yes [provider]  busPIRone  (BUSPAR ) 5 MG tablet Take 5 mg by mouth at bedtime. 02/05/23  Yes [provider]  flecainide  (TAMBOCOR ) 100 MG tablet Take 1 tablet (100 mg total) by mouth 2 (two) times daily. 04/12/23  Yes Hilty, Vinie BROCKS, MD  furosemide  (LASIX ) 40 MG tablet Take 1 tablet (40 mg total) by mouth daily. 06/05/23 09/03/23 Yes Hilty, Vinie BROCKS, MD  mirtazapine  (REMERON ) 15 MG tablet Take 15 mg by mouth at bedtime. 04/02/23  Yes [provider]  omeprazole  (PRILOSEC) 20 MG capsule Take 1 capsule by mouth once daily Patient taking differently: Take 20 mg by mouth daily before breakfast. 05/29/23  Yes Lanny Callander, MD  TYLENOL  500 MG tablet Take 500-1,000 mg by mouth every 6 (six) hours as needed for headache, mild pain (pain score 1-3) or fever.   Yes [provider]  valsartan -hydrochlorothiazide  (DIOVAN -HCT) 320-12.5 MG tablet Take 1 tablet by mouth daily. 03/06/23  Yes [provider]  vitamin B-12 (CYANOCOBALAMIN ) 500 MCG tablet Take 500 mcg by mouth 3 (three) times a week.   Yes [provider]  traMADol  (ULTRAM ) 50 MG tablet Take 1 tablet (50 mg total) by mouth every 6 (six) hours as needed. Patient not taking: Reported on 06/07/2023 04/25/23   Lanny Callander, MD     Critical care time: NA     Dorn Chill, MD [facility] Office: [phone]   See Amion for personal pager PCCM on call pager [phone] until 7pm. Please call Elink 7p-7a. [phone]

## 2023-06-12 NOTE — Inpatient Diabetes Management (Signed)
 Inpatient Diabetes Program Recommendations  AACE/ADA: New Consensus Statement on Inpatient Glycemic Control (2015)  Target Ranges:  Prepandial:   less than 140 mg/dL      Peak postprandial:   less than 180 mg/dL (1-2 hours)      Critically ill patients:  140 - 180 mg/dL   No results found for: GLUCAP, HGBA1C  Review of Glycemic Control  Latest Reference Range & Units 06/08/23 05:46 06/09/23 08:41 06/10/23 06:10 06/11/23 05:12 06/12/23 05:35  Glucose 70 - 99 mg/dL 880 (H) 806 (H) 875 (H) 262 (H) 351 (H)   Diabetes history: None  Current orders for Inpatient glycemic control: None  Note: Glucose in the mid 351 this am Solumedrol 125 mg Q12 hours  Inpatient Diabetes Program Recommendations:    -   Check CBGs achs -   Consider Novolog  0-9 units tid + hs May also need the addition of basal insulin  once we know the response of Novolog  on glucose trends.  Thanks,  Clotilda Bull RN, MSN, BC-ADM Inpatient Diabetes Coordinator Team Pager [phone] (8a-5p)

## 2023-06-12 NOTE — Progress Notes (Signed)
 PROGRESS NOTE    Corey [name]  FMW:[id number] DOB: April 08, 1935 DOA: 06/07/2023 PCP: Charlott Dorn LABOR, MD   Brief Narrative:  The patient is a 89 year old Caucasian male with a past medical history significant for but not limited to paroxysmal atrial fibrillation on anticoagulation with Eliquis , history of BPH, recently diagnosed neuroendocrine liver tumors with unknown primary being admitted to the hospital with weakness and shortness of breath.  Patient was noted to be hypoxic and had acute respiratory failure and most of the history is provided by his daughter and his wife and unfortunately the patient had been recently diagnosed with stage IV metastatic neuroendocrine tumor of unknown primary in November 2024.  Since that time he was started on Afinitor  but was unable to tolerate it due to significant fatigue and cough.  He was awaiting consultation with Dr. Malva for Lutathera Therapy and was supposed to have consultation with him yesterday but given his weakness and shortness of breath he came to the hospital.  He was recently seen by his oncologist on Monday and he had developed a cough which was dry and so the oncologist ordered a chest x-ray which revealed pneumonia.  He was then seen by his cardiologist on 06/05/2023 who felt that based on his presentation the patient may be a heart failure so he was started on Lasix .  He had a good response to Lasix  and his poor oral edema resolved however his cough and dyspnea continued.  Given this he presented to the ED and further workup showed evidence of progressive pulmonary consolidation.  He was initiated on IV antibiotics with empiric azithromycin  and Rocephin  and admitted and currently repeat chest x-ray done and showed Interval worsening multifocal left lung pneumonia. Low lung volumes with right lung involvement not fully excluded. Followup PA and lateral chest X-ray is recommended in 3-4 weeks following therapy to ensure resolution. Aortic  Atherosclerosis and Emphysema  His breathing treatments have been adjusted and we have added guaifenesin , flutter valve and incentive spirometry.  Obtained a CT scan of the chest PE protocol showed no evidence of PE but did show possible pulmonary edema.  He was given IV Solu-Medrol  and a dose of IV Lasix  and cardiology has now been consulted.  Pulmonary has now added IV steroids 125 mg every 12 for 5 more doses.  06/12/23: Patient's respiratory status worsened and he was transferred to the stepdown unit and placed on heated high flow nasal cannula 60 L.  Antibiotics have been escalated now to IV cefepime  and pulmonary added Bactrim .  Inflammatory markers being checked and cardiology give another dose of diuresis.  Will need to repeat chest x-ray in a.m. given that patient has progressively worsened last few days  Assessment and Plan:  Acute Respiratory Failure with Hypoxia in the setting of CAP vs Pneumonitis vs other including Acute on Chronic Diastolic CHF -Presented with acute respiratory failure with hypoxia and had a reported pulse oximetry of 85% on room air at home -Most likely etiology of his hypoxia due to community-acquired pneumonia and PE was considered in the setting of his malignancy but is unlikely given that the patient is already anticoagulated Eliquis  and has no pleuritic chest pain and had no evidence of tachycardia on admission however given his decompensation will obtain CTA PE Protocol today SpO2: 93 % O2 Flow Rate (L/min): 60 L/min FiO2 (%): 80 % -Continuous pulse oximetry and maintain O2 saturations greater than 90% -SARS-CoV-2 Negative  -Will add incentive spirometry, guaifenesin  1200 mg p.o. twice daily  and flutter valve -Patient decompensated today from a respiratory standpoint and was found to be saturating at 75% with 2 L of supplemental oxygen so had to be titrated up to 10 L salter.  Will get a CTA PE protocol and give the patient a dose of Solu-Medrol  1.5 mg x 1  and also call pulmonary for further evaluation -Given that the patient has been on everolimus  for about 3 weeks it can also potentially cause noninfectious pneumonitis and give a dose of IV Solu-Medrol  125 mg x1and a dose of IV Lasix  40 mg x1 -Now Pulmonary has started the patient on Solu-Medrol  125 mg every 12 and will plan for extended steroid taper with prednisone  40 to 60 mg with a taper -Also added Xopenex /Atrovent  every 6 scheduled and has now been changed to 3 times daily and also on Budesonide  0.25 mg twice daily -Supplemental oxygen via nasal cannula wean O2 as tolerated -Will need an ambulatory home O2 screen and repeat chest x-ray prior to discharge -MRSA Negative  -ESR was 4.8 and 22 was being checked on -Will need to follow-up on pulmonary recommendations given his decompensation and his oxygen requirement significantly worsened today.  He was transferred to the stepdown unit and placed on heated high flow nasal cannula 60 L  ? Bacteremia -1/4 showed GPC and likely a contaminant and this was Staph hominis -Repeat Blood Cx x2 done and showed NGTD at 3 days -C/w Abx as above  Community-Acquired Pneumonia -Has had progressive cough, hypoxia, multilobar pneumonia seen on chest x-ray and has been progressive when compared to x-ray from 06/04/2023.  No fever, leukocytosis, or other evidence of sepsis. -Observation admission but will change to Inpatient  -Continue supplemental oxygen if needed -C/w Empiric IV azithromycin  and IV Rocephin  escalated to IV cefepime  and was also placed on Bactrim  -Incentive spirometer, and flutter valve and Guaifenesin  -Treatment as above -Chest x-ray today showed Cardiomediastinal contours are stable. Aortic atherosclerosis. Bilateral airspace disease, left greater than right, similar to prior study. No visible effusions or pneumothorax. No acute bony abnormality. -CTA PE protocol done and showed No filling defect is identified in the pulmonary arterial  tree to suggest pulmonary embolus. Bilateral airspace opacities favoring acute pulmonary edema. Small left pleural effusion. Mild cardiomegaly. Hepatomegaly with heterogeneous density probably reflecting metastatic disease. Pseudocirrhosis contour. Mildly enlarged mediastinal and left supraclavicular nodes, mildly increased in size from prior. Congestion as a potential cause. Mildly dilated esophagus with air-fluid level. Aortic atherosclerosis -Patient was given a dose of steroids with Solu-Medrol  125 mg x 1 yesterday and was also given a dose of IV Lasix  40 mg yesterday and now pulmonary has started the patient on 125 mg of Solu-Medrol  every 12 hours and now pulmonary recommending an extended steroid taper with prednisone  and diuresis as able   Acute on Chronic Diastolic CHF -He had responded well to several days of p.o. Lasix , currently has no evidence of fluid overload. -Give IV Lasix  again -BNP went from 302.2 -> 446.9 -> 243.2 -Strict I's an O's and Daily Weights  Intake/Output Summary (Last 24 hours) at 06/12/2023 2142 Last data filed at 06/12/2023 1757 Gross per 24 hour  Intake 555.56 ml  Output 1125 ml  Net -569.44 ml   -ECHOCardiogram done and showed an EF of 50 to 55% with a LVEF having low normal function with no regional wall motion abnormalities in the left ventricular diastolic parameters consistent with grade 1 diastolic dysfunction with a right ventricular systolic function also being moderately reduced -Cardiology consulted  and patient will be seen by Dr. Mona today and he was given an additional dose of IV Lasix  today   Stage IV Neuroendocrine Tumor -Notified Dr. Lanny as a courtesy and she evaluated and will follow-up with him on Monday and recommend continue antibiotics for now -Patient is status post first-line Everolimus  -Will need outpatient follow-up with Dr. Lanny and Dr. Malva as previously planned   Paroxysmal Atrial Fibrillation -Continue to Monitor on Telemetry   -Continue Flecainide  100 mg po BID and Anticoagulation with Apixaban  5 mg po BID   Renal insufficiency/AKI -In the setting of his diuresis -BUN/Cr Trend: Recent Labs  Lab 06/04/23 1315 06/07/23 0909 06/08/23 0546 06/09/23 0841 06/10/23 0610 06/11/23 0512 06/12/23 0535  BUN 22 17 15 17 21  33* 44*  CREATININE 1.29* 1.11 0.95 0.94 1.01 1.29* 1.16  -Avoid Nephrotoxic Medications, Contrast Dyes, Hypotension and Dehydration to Ensure Adequate Renal Perfusion and will need to Renally Adjust Meds -Continue to Monitor and Trend Renal Function carefully and repeat CMP in the AM   Mildly abnormal AST -AST trend: Recent Labs  Lab 06/04/23 1315 06/07/23 0909 06/08/23 0546 06/09/23 0841 06/10/23 0610 06/11/23 0512 06/12/23 0535  AST 56* 49* 41 47* 51* 60* 51*  -Continue to Monitor and Trend and Repeat CMP in the AM   Hypertension -Continue home ARB with Irbesartan  300 mg po Daily -Continue to Monitor BP per Protocol -Last BP Reading was 122/83  Diabetes Mellitus Type 2 with Hyperglycemia  -HbA1c was 7.9 -Added resistant NovoLog  sliding scale insulin  ACHS and also with Semglee  10 units as well as 4 units of NovoLog  3 times daily with meals -CBG Trend: Recent Labs  Lab 06/12/23 1659 06/12/23 1750 06/12/23 1828 06/12/23 2040  GLUCAP 437* 457* 355* 283*  -Glucose Trend: Recent Labs  Lab 06/04/23 1315 06/07/23 0909 06/08/23 0546 06/09/23 0841 06/10/23 0610 06/11/23 0512 06/12/23 0535  GLUCOSE 211* 143* 119* 193* 124* 262* 351*    Depression and Anxiety -C/w Buproprion 150 mg po Daily   Normocytic Anemia -Hgb/Hct Trend: Recent Labs  Lab 06/04/23 1315 06/07/23 0909 06/08/23 0546 06/09/23 0841 06/10/23 0610 06/11/23 0512 06/12/23 0535  HGB 13.5 13.5 12.0* 12.3* 12.3* 13.2 12.8*  HCT 39.5 39.0 35.8* 38.1* 36.1* 40.6 39.5  -Checked Anemia Panel showed an iron level of 30, UIBC 258, TIBC 288, saturation history of 10%, ferritin level of 84, folate of 4.9 and  vitamin B12 level of 888 -Started Folic Acid  1 mg po Daily  -Continue to Monitor for S/Sx of Bleeding; No overt bleeding noted -Repeat CBC in the AM  Hyponatremia, improving slowly  -In the setting of Diuretics and suspected to be hypovolemic hyponatremia due to his recent reduced oral intake as well as diuretic usage -Was holding his home HCTZ and he was given a 1 L IV fluid bolus in the ED and will hold off further fluids and he is now getting IV Lasix  per Cardiology  -Na+ Trend: Recent Labs  Lab 06/04/23 1315 06/07/23 0909 06/08/23 0546 06/09/23 0841 06/10/23 0610 06/11/23 0512 06/12/23 0535  NA 130* 129* 132* 134* 136 137 132*  -Continue to Monitor and Trend and repeat CMP in the AM   Hypokalemia -Patient's K+ Level Trend: Recent Labs  Lab 06/04/23 1315 06/07/23 0909 06/08/23 0546 06/09/23 0841 06/10/23 0610 06/11/23 0512 06/12/23 0535  K 3.5 3.5 3.2* 3.8 4.2 4.1 4.0  -Continue to Monitor and Replete as Necessary -Repeat CMP in the AM   HLD -C/w Atorvastatin  20 mg p.o. daily  but may need to hold given mildly abnormal AST  Hyperbilirubinemia, improved  -Mild and Likely Reactive -Bilirubin Trend: Recent Labs  Lab 06/04/23 1315 06/07/23 0909 06/08/23 0546 06/09/23 0841 06/10/23 0610 06/11/23 0512 06/12/23 0535  BILITOT 1.3* 1.5* 1.3* 1.0 1.1 1.1 1.0  -Continue to Monitor and Trend and repeat CMP in the AM   GERD/GI Prophylaxis -C/w PPI with Pantoprazole  40 mg Daily   Hypoalbuminemia -Patient's Albumin Trending from 2.4-3.6 but last few checks have been 2.6 -Continue to Monitor and Trend and repeat CMP in the AM   DVT prophylaxis: SCDs Start: 06/07/23 1231 apixaban  (ELIQUIS ) tablet 5 mg    Code Status: Full Code Family Communication: Discussed with family at bedside  Disposition Plan:  Level of care: Stepdown Status is: Inpatient Remains inpatient appropriate because: Needs further clinical improvement and now transferring to SDU for [facility]    Consultants:  Pulmonary Cardiology Medical Oncology  Procedures:  As delineated as above   ECHOCARDIOGRAM IMPRESSIONS     1. Left ventricular ejection fraction, by estimation, is 50 to 55%. Left  ventricular ejection fraction by 2D MOD biplane is 50.5 %. The left  ventricle has low normal function. The left ventricle has no regional wall  motion abnormalities. Left  ventricular diastolic parameters are consistent with Grade I diastolic  dysfunction (impaired relaxation).   2. Right ventricular systolic function is mildly reduced. The right  ventricular size is normal.   3. The mitral valve is grossly normal. Trivial mitral valve  regurgitation.   4. The aortic valve is tricuspid. Aortic valve regurgitation is not  visualized. Aortic valve sclerosis/calcification is present, without any  evidence of aortic stenosis.   5. The inferior vena cava is normal in size with greater than 50%  respiratory variability, suggesting right atrial pressure of 3 mmHg.   Comparison(s): Prior images unable to be directly viewed, comparison made  by report only. Changes from prior study are noted. 07/28/2015: LVEF 50-55%.   FINDINGS   Left Ventricle: Left ventricular ejection fraction, by estimation, is 50  to 55%. Left ventricular ejection fraction by 2D MOD biplane is 50.5 %.  The left ventricle has low normal function. The left ventricle has no  regional wall motion abnormalities.  The left ventricular internal cavity size was normal in size. There is no  left ventricular hypertrophy. Left ventricular diastolic parameters are  consistent with Grade I diastolic dysfunction (impaired relaxation).  Indeterminate filling pressures.   Right Ventricle: The right ventricular size is normal. No increase in  right ventricular wall thickness. Right ventricular systolic function is  mildly reduced.   Left Atrium: Left atrial size was normal in size.   Right Atrium: Right atrial size was normal in  size.   Pericardium: There is no evidence of pericardial effusion.   Mitral Valve: The mitral valve is grossly normal. Trivial mitral valve  regurgitation.   Tricuspid Valve: The tricuspid valve is grossly normal. Tricuspid valve  regurgitation is trivial.   Aortic Valve: The aortic valve is tricuspid. Aortic valve regurgitation is  not visualized. Aortic valve sclerosis/calcification is present, without  any evidence of aortic stenosis. Aortic valve mean gradient measures 5.0  mmHg. Aortic valve peak gradient  measures 8.4 mmHg. Aortic valve area, by VTI measures 1.91 cm.   Pulmonic Valve: The pulmonic valve was normal in structure. Pulmonic valve  regurgitation is not visualized.   Aorta: The aortic root and ascending aorta are structurally normal, with  no evidence of dilitation.   Venous:  The inferior vena cava is normal in size with greater than 50%  respiratory variability, suggesting right atrial pressure of 3 mmHg.   IAS/Shunts: No atrial level shunt detected by color flow Doppler.     LEFT VENTRICLE  PLAX 2D                        Biplane EF (MOD)  LVIDd:         5.30 cm         LV Biplane EF:   Left  LVIDs:         3.70 cm                          ventricular  LV PW:         1.00 cm                          ejection  LV IVS:        0.80 cm                          fraction by  LVOT diam:     2.00 cm                          2D MOD  LV SV:         57                               biplane is  LV SV Index:   27                               50.5 %.  LVOT Area:     3.14 cm                                 Diastology                                 LV e' medial:    5.98 cm/s  LV Volumes (MOD)               LV E/e' medial:  8.0  LV vol d, MOD    129.2 ml      LV e' lateral:   11.60 cm/s  A2C:                           LV E/e' lateral: 4.1  LV vol d, MOD    125.8 ml  A4C:  LV vol s, MOD    64.1 ml  A2C:  LV vol s, MOD    58.0 ml  A4C:  LV SV MOD A2C:   65.2 ml   LV SV MOD A4C:   125.8 ml  LV SV MOD BP:    56.6 ml   RIGHT VENTRICLE  RV Basal diam:  3.40 cm  RV S prime:     7.94 cm/s  TAPSE (M-mode): 1.6 cm   LEFT ATRIUM             Index  RIGHT ATRIUM           Index  LA diam:        3.40 cm 1.62 cm/m   RA Area:     14.10 cm  LA Vol (A2C):   67.0 ml 31.88 ml/m  RA Volume:   29.60 ml  14.09 ml/m  LA Vol (A4C):   37.2 ml 17.70 ml/m  LA Biplane Vol: 48.8 ml 23.22 ml/m   AORTIC VALVE  AV Area (Vmax):    2.16 cm  AV Area (Vmean):   1.90 cm  AV Area (VTI):     1.91 cm  AV Vmax:           145.00 cm/s  AV Vmean:          102.000 cm/s  AV VTI:            0.296 m  AV Peak Grad:      8.4 mmHg  AV Mean Grad:      5.0 mmHg  LVOT Vmax:         99.90 cm/s  LVOT Vmean:        61.600 cm/s  LVOT VTI:          0.180 m  LVOT/AV VTI ratio: 0.61    AORTA  Ao Root diam: 3.40 cm   MITRAL VALVE  MV Area (PHT): 2.34 cm    SHUNTS  MV Decel Time: 324 msec    Systemic VTI:  0.18 m  MV E velocity: 47.60 cm/s  Systemic Diam: 2.00 cm  MV A velocity: 74.10 cm/s  MV E/A ratio:  0.64   Antimicrobials:  Anti-infectives (From admission, onward)    Start     Dose/Rate Route Frequency Ordered Stop   06/13/23 1300  Vancomycin  (VANCOCIN ) 1,500 mg in sodium chloride  0.9 % 500 mL IVPB  Status:  Discontinued        1,500 mg 250 mL/hr over 120 Minutes Intravenous Every 24 hours 06/12/23 1343 06/12/23 1621   06/12/23 1500  sulfamethoxazole -trimethoprim  (BACTRIM ) 428 mg of trimethoprim  in dextrose  5 % 500 mL IVPB  Status:  Discontinued        15 mg/kg/day of trimethoprim   85.6 kg 351.2 mL/hr over 90 Minutes Intravenous Every 8 hours 06/12/23 1402 06/12/23 1408   06/12/23 1500  sulfamethoxazole -trimethoprim  (BACTRIM  DS) 800-160 MG per tablet 2 tablet        2 tablet Oral Every 8 hours 06/12/23 1408     06/12/23 1400  ceFEPIme  (MAXIPIME ) 2 g in sodium chloride  0.9 % 100 mL IVPB        2 g 200 mL/hr over 30 Minutes Intravenous Every 12 hours 06/12/23 1241      06/12/23 1330  vancomycin  (VANCOREADY) IVPB 2000 mg/400 mL        2,000 mg 200 mL/hr over 120 Minutes Intravenous  Once 06/12/23 1241 06/12/23 1310   06/10/23 1000  azithromycin  (ZITHROMAX ) tablet 500 mg        500 mg Oral Daily 06/09/23 1252 06/11/23 0847   06/08/23 1200  cefTRIAXone  (ROCEPHIN ) 2 g in sodium chloride  0.9 % 100 mL IVPB  Status:  Discontinued        2 g 200 mL/hr over 30 Minutes Intravenous Every 24 hours 06/07/23 1232 06/12/23 1241   06/08/23 1100  azithromycin  (ZITHROMAX ) 500 mg in sodium chloride  0.9 % 250 mL IVPB  Status:  Discontinued        500 mg 250 mL/hr over 60 Minutes Intravenous Every 24  hours 06/07/23 1232 06/09/23 1252   06/07/23 1115  cefTRIAXone  (ROCEPHIN ) 1 g in sodium chloride  0.9 % 100 mL IVPB        1 g 200 mL/hr over 30 Minutes Intravenous  Once 06/07/23 1103 06/07/23 1353   06/07/23 1115  azithromycin  (ZITHROMAX ) 500 mg in sodium chloride  0.9 % 250 mL IVPB        500 mg 250 mL/hr over 60 Minutes Intravenous  Once 06/07/23 1103 06/07/23 1522       Subjective: Seen and examined at bedside who is very short of breath still.  Respiratory status was following at his oxygen saturations were dropping even with 12 L.  Patient denies any lightheadedness or dizziness.  He thinks he is doing a little bit better today.  No nausea or vomiting.  No other concerns or complaints at this time.  Objective: Vitals:   06/12/23 1700 06/12/23 1800 06/12/23 1900 06/12/23 2000  BP: (!) 116/56  117/65 122/83  Pulse: 74 91 83 80  Resp: (!) 26 (!) 26 (!) 29 (!) 31  Temp: 97.7 F (36.5 C)   97.8 F (36.6 C)  TempSrc: Oral   Oral  SpO2: 95% 92% 95% 93%  Weight:      Height:        Intake/Output Summary (Last 24 hours) at 06/12/2023 2151 Last data filed at 06/12/2023 1757 Gross per 24 hour  Intake 555.56 ml  Output 1125 ml  Net -569.44 ml   Filed Weights   06/11/23 1600 06/12/23 0532 06/12/23 1300  Weight: 83.8 kg 86.4 kg 85.6 kg   Examination: Physical  Exam:  Constitutional: Overweight elderly chronically ill-appearing Caucasian male in from respiratory distress Respiratory: Diminished to auscultation bilaterally with some coarse breath sounds and some crackles and rhonchi but no appreciable wheezing.  Has a slightly increased respiratory effort and is mildly tachypneic but has no accessory muscle use he is wearing supplemental oxygen nasal cannula Cardiovascular: RRR, no murmurs / rubs / gallops. S1 and S2 auscultated.  Trace extremity edema.  Abdomen: Soft, non-tender, distended secondary body habitus. Bowel sounds positive.  GU: Deferred. Musculoskeletal: No clubbing / cyanosis of digits/nails. No joint deformity upper and lower extremities.  Skin: No rashes, lesions, ulcers, on a limited skin evaluation.. No induration; Warm and dry.  Neurologic: CN 2-12 grossly intact with no focal deficits. Romberg sign and cerebellar reflexes not assessed.  Psychiatric: Normal judgment and insight. Alert and oriented x 3. Normal mood and appropriate affect.   Data Reviewed: I have personally reviewed following labs and imaging studies  CBC: Recent Labs  Lab 06/07/23 0909 06/08/23 0546 06/09/23 0841 06/10/23 0610 06/11/23 0512 06/12/23 0535  WBC 6.4 5.4 7.2 7.9 5.4 12.5*  NEUTROABS 4.9  --  5.1 5.5 4.8 11.4*  HGB 13.5 12.0* 12.3* 12.3* 13.2 12.8*  HCT 39.0 35.8* 38.1* 36.1* 40.6 39.5  MCV 86.9 87.7 89.2 88.7 87.9 88.0  PLT 151 162 202 219 286 358   Basic Metabolic Panel: Recent Labs  Lab 06/08/23 0546 06/09/23 0841 06/10/23 0610 06/11/23 0512 06/12/23 0535  NA 132* 134* 136 137 132*  K 3.2* 3.8 4.2 4.1 4.0  CL 98 99 97* 99 96*  CO2 25 24 29 24 25   GLUCOSE 119* 193* 124* 262* 351*  BUN 15 17 21  33* 44*  CREATININE 0.95 0.94 1.01 1.29* 1.16  CALCIUM  7.9* 8.4* 8.5* 8.8* 8.7*  MG  --  2.2 2.3 2.5* 2.8*  PHOS  --  2.8 3.3 5.0* 4.0  GFR: Estimated Creatinine Clearance: 51.7 mL/min (by C-G formula based on SCr of 1.16  mg/dL). Liver Function Tests: Recent Labs  Lab 06/08/23 0546 06/09/23 0841 06/10/23 0610 06/11/23 0512 06/12/23 0535  AST 41 47* 51* 60* 51*  ALT 30 32 30 34 33  ALKPHOS 370* 383* 378* 411* 372*  BILITOT 1.3* 1.0 1.1 1.1 1.0  PROT 5.8* 6.1* 6.1* 6.7 6.4*  ALBUMIN 2.4* 2.5* 2.5* 2.6* 2.6*   Recent Labs  Lab 06/07/23 0909  LIPASE 38   No results for input(s): AMMONIA in the last 168 hours. Coagulation Profile: No results for input(s): INR, PROTIME in the last 168 hours. Cardiac Enzymes: No results for input(s): CKTOTAL, CKMB, CKMBINDEX, TROPONINI in the last 168 hours. BNP (last 3 results) No results for input(s): PROBNP in the last 8760 hours. HbA1C: Recent Labs    06/12/23 1303  HGBA1C 7.9*   CBG: Recent Labs  Lab 06/12/23 1659 06/12/23 1750 06/12/23 1828 06/12/23 2040  GLUCAP 437* 457* 355* 283*   Lipid Profile: No results for input(s): CHOL, HDL, LDLCALC, TRIG, CHOLHDL, LDLDIRECT in the last 72 hours. Thyroid  Function Tests: No results for input(s): TSH, T4TOTAL, FREET4, T3FREE, THYROIDAB in the last 72 hours. Anemia Panel: Recent Labs    06/10/23 0610  VITAMINB12 888  FOLATE 4.9*  FERRITIN 84  TIBC 288  IRON 30*  RETICCTPCT 1.3   Sepsis Labs: No results for input(s): PROCALCITON, LATICACIDVEN in the last 168 hours.  Recent Results (from the past 240 hours)  Culture, blood (routine x 2)     Status: None   Collection Time: 06/07/23 11:58 AM   Specimen: BLOOD  Result Value Ref Range Status   Specimen Description   Final    BLOOD LEFT ANTECUBITAL Performed at [facility], [address] [place], [place] [address]    Special Requests   Final    BOTTLES DRAWN AEROBIC AND ANAEROBIC Blood Culture adequate volume Performed at [facility], [address], [place], [place] [address]    Culture   Final    NO GROWTH 5 DAYS Performed at [facility], [address] [place], [place] [address]    Report Status 06/12/2023 FINAL  Final  Culture, blood (routine x 2)     Status: Abnormal   Collection Time: 06/07/23 11:58 AM   Specimen: BLOOD  Result Value Ref Range Status   Specimen Description   Final    BLOOD RIGHT ANTECUBITAL Performed at [facility], [address], [place], [place] [address]    Special Requests   Final    BOTTLES DRAWN AEROBIC AND ANAEROBIC Blood Culture adequate volume Performed at [facility], [address], [place], [place] [address]    Culture  Setup Time   Final    GRAM POSITIVE COCCI IN BOTH AEROBIC AND ANAEROBIC BOTTLES CRITICAL RESULT CALLED TO, READ BACK BY AND VERIFIED WITH: PHARMD SYDNEY, D [phone] [id number] FCP    Culture (A)  Final    STAPHYLOCOCCUS HOMINIS THE SIGNIFICANCE OF ISOLATING THIS ORGANISM FROM A SINGLE SET OF BLOOD CULTURES WHEN MULTIPLE SETS ARE DRAWN IS UNCERTAIN. PLEASE NOTIFY THE MICROBIOLOGY DEPARTMENT WITHIN ONE WEEK IF SPECIATION AND SENSITIVITIES ARE REQUIRED. Performed at [facility], [address], [place], [place] [address]    Report Status 06/09/2023 FINAL  Final  Blood Culture ID Panel (Reflexed)     Status: Abnormal   Collection Time: 06/07/23 11:58 AM  Result Value Ref  Range Status   Enterococcus faecalis NOT DETECTED NOT DETECTED Final   Enterococcus Faecium NOT DETECTED NOT DETECTED Final   Listeria monocytogenes NOT DETECTED NOT DETECTED Final   Staphylococcus species DETECTED (A) NOT DETECTED Final    Comment: CRITICAL RESULT CALLED TO, READ BACK BY AND VERIFIED WITH: PHARMD SYDNEY, D [phone] FCP    Staphylococcus aureus (BCID) NOT DETECTED NOT DETECTED Final   Staphylococcus epidermidis NOT DETECTED NOT DETECTED Final   Staphylococcus lugdunensis NOT DETECTED NOT DETECTED Final   Streptococcus species NOT DETECTED NOT DETECTED Final   Streptococcus agalactiae NOT DETECTED NOT DETECTED Final   Streptococcus pneumoniae NOT  DETECTED NOT DETECTED Final   Streptococcus pyogenes NOT DETECTED NOT DETECTED Final   A.calcoaceticus-baumannii NOT DETECTED NOT DETECTED Final   Bacteroides fragilis NOT DETECTED NOT DETECTED Final   Enterobacterales NOT DETECTED NOT DETECTED Final   Enterobacter cloacae complex NOT DETECTED NOT DETECTED Final   Escherichia coli NOT DETECTED NOT DETECTED Final   Klebsiella aerogenes NOT DETECTED NOT DETECTED Final   Klebsiella oxytoca NOT DETECTED NOT DETECTED Final   Klebsiella pneumoniae NOT DETECTED NOT DETECTED Final   Proteus species NOT DETECTED NOT DETECTED Final   Salmonella species NOT DETECTED NOT DETECTED Final   Serratia marcescens NOT DETECTED NOT DETECTED Final   Haemophilus influenzae NOT DETECTED NOT DETECTED Final   Neisseria meningitidis NOT DETECTED NOT DETECTED Final   Pseudomonas aeruginosa NOT DETECTED NOT DETECTED Final   Stenotrophomonas maltophilia NOT DETECTED NOT DETECTED Final   Candida albicans NOT DETECTED NOT DETECTED Final   Candida auris NOT DETECTED NOT DETECTED Final   Candida glabrata NOT DETECTED NOT DETECTED Final   Candida krusei NOT DETECTED NOT DETECTED Final   Candida parapsilosis NOT DETECTED NOT DETECTED Final   Candida tropicalis NOT DETECTED NOT DETECTED Final   Cryptococcus neoformans/gattii NOT DETECTED NOT DETECTED Final    Comment: Performed at [facility], [address], [place], [place] [address]  Culture, blood (Routine X 2) w Reflex to ID Panel     Status: None (Preliminary result)   Collection Time: 06/08/23 10:12 AM   Specimen: BLOOD LEFT HAND  Result Value Ref Range Status   Specimen Description   Final    BLOOD LEFT HAND Performed at [facility], [address] [place], [place] [address]    Special Requests   Final    BOTTLES DRAWN AEROBIC ONLY Blood Culture results may not be optimal due to an inadequate volume of blood received in culture bottles Performed at [facility], [address], [place], [place] [address]    Culture   Final    NO GROWTH 4 DAYS Performed at [facility], [address], [place], [place] [address]    Report Status PENDING  Incomplete  Culture, blood (Routine X 2) w Reflex to ID Panel     Status: None (Preliminary result)   Collection Time: 06/08/23 10:12 AM   Specimen: BLOOD RIGHT HAND  Result Value Ref Range Status   Specimen Description   Final    BLOOD RIGHT HAND Performed at [facility], [address] [place], [place] [address]    Special Requests   Final    BOTTLES DRAWN AEROBIC ONLY Blood Culture results may not be optimal due to an inadequate volume of blood received in culture bottles Performed at [facility], [address] [place], [place] [address]    Culture  Final    NO GROWTH 4 DAYS Performed at [facility], [address], [place], [place] [address]    Report Status PENDING  Incomplete  Respiratory (~20 pathogens) panel by PCR     Status: None   Collection Time: 06/08/23  6:34 PM   Specimen: Nasopharyngeal Swab; Respiratory  Result Value Ref Range Status   Adenovirus NOT DETECTED NOT DETECTED Final   Coronavirus 229E NOT DETECTED NOT DETECTED Final    Comment: (NOTE) The Coronavirus on the Respiratory Panel, DOES NOT test for the novel  Coronavirus (2019 nCoV)    Coronavirus HKU1 NOT DETECTED NOT DETECTED Final   Coronavirus NL63 NOT DETECTED NOT DETECTED Final   Coronavirus OC43 NOT DETECTED NOT DETECTED Final   Metapneumovirus NOT DETECTED NOT DETECTED Final   Rhinovirus / Enterovirus NOT DETECTED NOT DETECTED Final   Influenza A NOT DETECTED NOT DETECTED Final   Influenza B NOT DETECTED NOT DETECTED Final   Parainfluenza Virus 1 NOT DETECTED NOT DETECTED Final   Parainfluenza Virus 2 NOT DETECTED NOT DETECTED Final   Parainfluenza Virus 3 NOT DETECTED NOT DETECTED Final   Parainfluenza Virus 4 NOT DETECTED NOT DETECTED Final   Respiratory Syncytial Virus NOT  DETECTED NOT DETECTED Final   Bordetella pertussis NOT DETECTED NOT DETECTED Final   Bordetella Parapertussis NOT DETECTED NOT DETECTED Final   Chlamydophila pneumoniae NOT DETECTED NOT DETECTED Final   Mycoplasma pneumoniae NOT DETECTED NOT DETECTED Final    Comment: Performed at [facility], [address], [place], [place] [address]  SARS Coronavirus 2 by RT PCR (hospital order, performed in [facility] hospital lab) *cepheid single result test* Anterior Nasal Swab     Status: None   Collection Time: 06/09/23  7:25 PM   Specimen: Anterior Nasal Swab  Result Value Ref Range Status   SARS Coronavirus 2 by RT PCR NEGATIVE NEGATIVE Final    Comment: (NOTE) SARS-CoV-2 target nucleic acids are NOT DETECTED.  The SARS-CoV-2 RNA is generally detectable in upper and lower respiratory specimens during the acute phase of infection. The lowest concentration of SARS-CoV-2 viral copies this assay can detect is 250 copies / mL. A negative result does not preclude SARS-CoV-2 infection and should not be used as the sole basis for treatment or other patient management decisions.  A negative result may occur with improper specimen collection / handling, submission of specimen other than nasopharyngeal swab, presence of viral mutation(s) within the areas targeted by this assay, and inadequate number of viral copies (<250 copies / mL). A negative result must be combined with clinical observations, patient history, and epidemiological information.  Fact Sheet for Patients:   roadlaptop.co.za  Fact Sheet for Healthcare Providers: http://kim-miller.com/  This test is not yet approved or  cleared by the [place]  FDA and has been authorized for detection and/or diagnosis of SARS-CoV-2 by FDA under an Emergency Use Authorization (EUA).  This EUA will remain in effect (meaning this test can be used) for the duration of the COVID-19 declaration under  Section 564(b)(1) of the Act, 21 U.S.C. section 360bbb-3(b)(1), unless the authorization is terminated or revoked sooner.  Performed at [facility], [address], [place], [place] [address]   MRSA Next Gen by PCR, Nasal     Status: None   Collection Time: 06/12/23  1:05 PM   Specimen: Nasal Mucosa; Nasal Swab  Result Value Ref Range Status   MRSA by PCR Next Gen NOT DETECTED NOT DETECTED Final  Comment: (NOTE) The GeneXpert MRSA Assay (FDA approved for NASAL specimens only), is one component of a comprehensive MRSA colonization surveillance program. It is not intended to diagnose MRSA infection nor to guide or monitor treatment for MRSA infections. Test performance is not FDA approved in patients less than 30 years old. Performed at [facility], [address], [place], [place] [address]     Radiology Studies: DG CHEST PORT 1 VIEW Result Date: 06/12/2023 CLINICAL DATA:  Shortness of breath EXAM: PORTABLE CHEST 1 VIEW COMPARISON:  06/11/2023 FINDINGS: Cardiomediastinal contours are stable. Aortic atherosclerosis. Bilateral airspace disease, left greater than right, similar to prior study. No visible effusions or pneumothorax. No acute bony abnormality. IMPRESSION: Stable left greater than right bilateral airspace opacities. Electronically Signed   By: Franky Crease M.D.   On: 06/12/2023 09:30   DG CHEST PORT 1 VIEW Result Date: 06/11/2023 CLINICAL DATA:  Community acquired pneumonia.  Shortness of breath. EXAM: PORTABLE CHEST 1 VIEW COMPARISON:  Chest radiograph dated 06/09/2023. FINDINGS: No significant interval change in the bilateral pulmonary opacities, left greater than right. No pleural effusion or pneumothorax. Stable cardiac silhouette no acute osseous pathology. IMPRESSION: No significant interval change in the bilateral pulmonary opacities, left greater than right. Electronically Signed   By: Vanetta Chou M.D.   On: 06/11/2023 10:21    Scheduled Meds:  apixaban   5 mg Oral BID   atorvastatin   20 mg Oral Daily   budesonide  (PULMICORT ) nebulizer solution  0.25 mg Nebulization BID   buPROPion   150 mg Oral q morning   busPIRone   5 mg Oral QHS   Chlorhexidine  Gluconate Cloth  6 each Topical Daily   feeding supplement  237 mL Oral BID BM   flecainide   100 mg Oral BID   folic acid   1 mg Oral Daily   guaiFENesin   1,200 mg Oral BID   insulin  aspart  0-20 Units Subcutaneous TID WC   insulin  aspart  0-5 Units Subcutaneous QHS   insulin  aspart  4 Units Subcutaneous TID WC   insulin  glargine-yfgn  10 Units Subcutaneous Daily   ipratropium  0.5 mg Nebulization TID   irbesartan   300 mg Oral Daily   levalbuterol   0.63 mg Nebulization TID   methylPREDNISolone  (SOLU-MEDROL ) injection  125 mg Intravenous Q12H   mirtazapine   15 mg Oral QHS   pantoprazole   40 mg Oral Daily   sulfamethoxazole -trimethoprim   2 tablet Oral Q8H   Continuous Infusions:  ceFEPime  (MAXIPIME ) IV Stopped (06/12/23 1553)    LOS: 4 days   Alejandro Marker, DO [facility] Available via Epic secure chat 7am-7pm After these hours, please refer to coverage provider listed on [email or web address] 06/12/2023, 9:51 PM

## 2023-06-12 NOTE — Progress Notes (Signed)
 Pharmacy Antibiotic Note  Corey [name] is a 89 y.o. male admitted on 06/07/2023 with shortness of breath. He was started on Rocephin  and azithromycin  for CAP. Patient with increasing oxygen needs; transferred to the ICU/SDU. Pharmacy has now been consulted for vancomycin  and cefepime  dosing.  Plan: -Vancomycin  2 g IV x 1 followed by 1500 mg IV q24h -Stop Rocephin  and start cefepime  2 g IV q12h -Continue to follow renal function, cultures and clinical progress for dose adjustments and de-escalation as indicated  Height: 6' 1 (185.4 cm) Weight: 86.4 kg (190 lb 7.6 oz) IBW/kg (Calculated) : 79.9  Temp (24hrs), Avg:97.6 F (36.4 C), Min:97.5 F (36.4 C), Max:97.7 F (36.5 C)  Recent Labs  Lab 06/08/23 0546 06/09/23 0841 06/10/23 0610 06/11/23 0512 06/12/23 0535  WBC 5.4 7.2 7.9 5.4 12.5*  CREATININE 0.95 0.94 1.01 1.29* 1.16    Estimated Creatinine Clearance: 51.7 mL/min (by C-G formula based on SCr of 1.16 mg/dL).    No Known Allergies  Antimicrobials this admission: Cefepime  1/14 >> Vancomycin  1/14 >> Rocephin  1/9 >> 1/14 Azithromycin  1/9 >> 1/13  Dose adjustments this admission: NA  Microbiology results: 1/10 BCx: ngtd 1/09 BCx: 2/4 (1 set) staph hominis (likely contaminant) 1/14 MRSA PCR: pending  Thank you for allowing pharmacy to be a part of this patient's care.  Stefano MARLA Bologna, PharmD, BCPS Clinical Pharmacist 06/12/2023 1:34 PM

## 2023-06-13 ENCOUNTER — Inpatient Hospital Stay (HOSPITAL_COMMUNITY): Payer: Medicare Other

## 2023-06-13 DIAGNOSIS — J189 Pneumonia, unspecified organism: Secondary | ICD-10-CM | POA: Diagnosis not present

## 2023-06-13 DIAGNOSIS — I5031 Acute diastolic (congestive) heart failure: Secondary | ICD-10-CM

## 2023-06-13 LAB — CBC WITH DIFFERENTIAL/PLATELET
Abs Immature Granulocytes: 0.14 10*3/uL — ABNORMAL HIGH (ref 0.00–0.07)
Basophils Absolute: 0 10*3/uL (ref 0.0–0.1)
Basophils Relative: 0 %
Eosinophils Absolute: 0 10*3/uL (ref 0.0–0.5)
Eosinophils Relative: 0 %
HCT: 35.8 % — ABNORMAL LOW (ref 39.0–52.0)
Hemoglobin: 11.7 g/dL — ABNORMAL LOW (ref 13.0–17.0)
Immature Granulocytes: 1 %
Lymphocytes Relative: 3 %
Lymphs Abs: 0.4 10*3/uL — ABNORMAL LOW (ref 0.7–4.0)
MCH: 28.7 pg (ref 26.0–34.0)
MCHC: 32.7 g/dL (ref 30.0–36.0)
MCV: 88 fL (ref 80.0–100.0)
Monocytes Absolute: 1.2 10*3/uL — ABNORMAL HIGH (ref 0.1–1.0)
Monocytes Relative: 9 %
Neutro Abs: 12.2 10*3/uL — ABNORMAL HIGH (ref 1.7–7.7)
Neutrophils Relative %: 87 %
Platelets: 313 10*3/uL (ref 150–400)
RBC: 4.07 MIL/uL — ABNORMAL LOW (ref 4.22–5.81)
RDW: 13.5 % (ref 11.5–15.5)
WBC: 14 10*3/uL — ABNORMAL HIGH (ref 4.0–10.5)
nRBC: 0 % (ref 0.0–0.2)

## 2023-06-13 LAB — COMPREHENSIVE METABOLIC PANEL
ALT: 45 U/L — ABNORMAL HIGH (ref 0–44)
AST: 60 U/L — ABNORMAL HIGH (ref 15–41)
Albumin: 2.4 g/dL — ABNORMAL LOW (ref 3.5–5.0)
Alkaline Phosphatase: 338 U/L — ABNORMAL HIGH (ref 38–126)
Anion gap: 13 (ref 5–15)
BUN: 50 mg/dL — ABNORMAL HIGH (ref 8–23)
CO2: 22 mmol/L (ref 22–32)
Calcium: 8.7 mg/dL — ABNORMAL LOW (ref 8.9–10.3)
Chloride: 101 mmol/L (ref 98–111)
Creatinine, Ser: 1.31 mg/dL — ABNORMAL HIGH (ref 0.61–1.24)
GFR, Estimated: 53 mL/min — ABNORMAL LOW (ref >60)
Glucose, Bld: 128 mg/dL — ABNORMAL HIGH (ref 70–99)
Potassium: 3.8 mmol/L (ref 3.5–5.1)
Sodium: 136 mmol/L (ref 135–145)
Total Bilirubin: 0.7 mg/dL (ref 0.0–1.2)
Total Protein: 5.8 g/dL — ABNORMAL LOW (ref 6.5–8.1)

## 2023-06-13 LAB — GLUCOSE, CAPILLARY
Glucose-Capillary: 200 mg/dL — ABNORMAL HIGH (ref 70–99)
Glucose-Capillary: 323 mg/dL — ABNORMAL HIGH (ref 70–99)
Glucose-Capillary: 247 mg/dL — ABNORMAL HIGH (ref 70–99)
Glucose-Capillary: 92 mg/dL (ref 70–99)

## 2023-06-13 LAB — CULTURE, BLOOD (ROUTINE X 2)
Culture: NO GROWTH
Culture: NO GROWTH

## 2023-06-13 LAB — MAGNESIUM: Magnesium: 2.8 mg/dL — ABNORMAL HIGH (ref 1.7–2.4)

## 2023-06-13 LAB — PHOSPHORUS: Phosphorus: 3.9 mg/dL (ref 2.5–4.6)

## 2023-06-13 LAB — BRAIN NATRIURETIC PEPTIDE: B Natriuretic Peptide: 174.5 pg/mL — ABNORMAL HIGH (ref 0.0–100.0)

## 2023-06-13 MED ORDER — PREDNISONE 20 MG PO TABS
60.0000 mg | ORAL_TABLET | Freq: Every day | ORAL | Status: DC
  Administered 2023-06-14 – 2023-06-18 (×5): 60 mg via ORAL
  Filled 2023-06-13 (×5): qty 3

## 2023-06-13 NOTE — Progress Notes (Signed)
 NAME:  Corey [name], MRN:  [id number], DOB:  01/31/1935, LOS: 5 ADMISSION DATE:  06/07/2023, CONSULTATION DATE: 06/10/2023 REFERRING MD: Dr. Gillermo Lack, CHIEF COMPLAINT: Acute hypoxemic respiratory failure  History of Present Illness:  89 year old man with a history of former tobacco, newly diagnosed metastatic neuroendocrine tumor followed by Dr. Maryalice Smaller.  Also with atrial fibrillation (now NSR), hypertension, CAD with a reassuring cardiac stress test 2023.  EF 51%.  For his neuroendocrine tumor he has been treated initially with Lutathera (stopped for transaminitis) and then everolimus  for 3 weeks (stopped 1/6).  At that visit 1/6 he was found to be hypoxemic and had patchy bilateral pulmonary infiltrates on chest x-ray.  Subsequently seen by Dr. Maximo Spar with cardiology and started on furosemide  for possible component of pulmonary edema.  Now admitted 06/07/2023 with progressive dyspnea and cough.  He was started on azithromycin  and ceftriaxone  for possible community-acquired pneumonia.  Chest x-ray at the time of admission shows progression of left-sided hazy pulmonary infiltrates.  Pertinent  Medical History   Past Medical History:  Diagnosis Date   Basal cell carcinoma of auricle of right ear    BPH (benign prostatic hyperplasia)    DJD (degenerative joint disease)    left   ED (erectile dysfunction)    Fatigue    Hearing loss, sensorineural    Hx of adenomatous colonic polyps    Hx of major depression    Hypertension    Iron deficiency anemia    Nephrolithiasis    Paroxysmal atrial fibrillation (HCC)    Seasonal allergic rhinitis   Metastatic neuroendocrine tumor Atrial fibrillation   Significant Hospital Events: Including procedures, antibiotic start and stop dates in addition to other pertinent events   1/14 increase in O2 requirements overnight, transferred to step down unit on [facility]NC  Interim History / Subjective:   He is feeling a bit more comfortable today No  fevers  Objective   Blood pressure 117/66, pulse 77, temperature 97.6 F (36.4 C), temperature source Oral, resp. rate (!) 27, height 6\' 1"  (1.854 m), weight 85.1 kg, SpO2 96%.    FiO2 (%):  [80 %] 80 %   Intake/Output Summary (Last 24 hours) at 06/13/2023 1009 Last data filed at 06/13/2023 9811 Gross per 24 hour  Intake 282.18 ml  Output 1150 ml  Net -867.82 ml   Filed Weights   06/12/23 0532 06/12/23 1300 06/13/23 0500  Weight: 86.4 kg 85.6 kg 85.1 kg    Examination: General: Elderly man laying in bed in no distress on HHFNC HENT: Oropharynx clear, strong voice, no secretions, no stridor Lungs: bronchial breath sounds, improved air movement, scattered rales, no wheezing Cardiovascular: Regular, distant, no murmur Abdomen: Nondistended with positive bowel sounds Extremities: No peripheral edema Neuro: Awake, alert, interacting appropriately, follows commands GU: Deferred  Resolved Hospital Problem list     Assessment & Plan:  Acute hypoxemic respiratory failure with pulmonary infiltrates.  Differential diagnosis includes pneumonia although WBC reassuring, pneumonitis due to everolimus  (just stopped recently), asymmetric cardiogenic pulmonary edema.  PE would seem very unlikely with pulmonary infiltrates and also on apixaban  as an outpatient -Continue cefepime  and bactrim  - LDH elevated, started on empiric bactrim  for pneumocystis coverage - MRSA screen negative - BNP trending down with diuresis - Completed 3 days of solumedrol 125mg  BID. - Start 60mg  prednisone  daily tomorrow.  -  Not really in a position given his oxygen needs to consider bronchoscopy for culture, cell count -Follow serial chest x-ray -follow up fungitell  Blood culture  Staph hominis 1 of 4 -Unclear significance, suspect contaminant -Currently covered with ceftriaxone   Hypertension Paroxysmal atrial fibrillation HFpEF -cardiology following   Metastatic neuroendocrine tumor -Appreciate Dr.  Candise Chambers assistance  Hyponatremia Hypokalemia    Labs   CBC: Recent Labs  Lab 06/09/23 0841 06/10/23 0610 06/11/23 0512 06/12/23 0535 06/13/23 0258  WBC 7.2 7.9 5.4 12.5* 14.0*  NEUTROABS 5.1 5.5 4.8 11.4* 12.2*  HGB 12.3* 12.3* 13.2 12.8* 11.7*  HCT 38.1* 36.1* 40.6 39.5 35.8*  MCV 89.2 88.7 87.9 88.0 88.0  PLT 202 219 286 358 313    Basic Metabolic Panel: Recent Labs  Lab 06/09/23 0841 06/10/23 0610 06/11/23 0512 06/12/23 0535 06/13/23 0258  NA 134* 136 137 132* 136  K 3.8 4.2 4.1 4.0 3.8  CL 99 97* 99 96* 101  CO2 24 29 24 25 22   GLUCOSE 193* 124* 262* 351* 128*  BUN 17 21 33* 44* 50*  CREATININE 0.94 1.01 1.29* 1.16 1.31*  CALCIUM  8.4* 8.5* 8.8* 8.7* 8.7*  MG 2.2 2.3 2.5* 2.8* 2.8*  PHOS 2.8 3.3 5.0* 4.0 3.9   GFR: Estimated Creatinine Clearance: 45.7 mL/min (A) (by C-G formula based on SCr of 1.31 mg/dL (H)). Recent Labs  Lab 06/10/23 0610 06/11/23 0512 06/12/23 0535 06/13/23 0258  WBC 7.9 5.4 12.5* 14.0*    Liver Function Tests: Recent Labs  Lab 06/09/23 0841 06/10/23 0610 06/11/23 0512 06/12/23 0535 06/13/23 0258  AST 47* 51* 60* 51* 60*  ALT 32 30 34 33 45*  ALKPHOS 383* 378* 411* 372* 338*  BILITOT 1.0 1.1 1.1 1.0 0.7  PROT 6.1* 6.1* 6.7 6.4* 5.8*  ALBUMIN 2.5* 2.5* 2.6* 2.6* 2.4*   Recent Labs  Lab 06/07/23 0909  LIPASE 38   No results for input(s): "AMMONIA" in the last 168 hours.  ABG    Component Value Date/Time   PHART 7.43 06/12/2023 1300   PCO2ART 38 06/12/2023 1300   PO2ART 57 (L) 06/12/2023 1300   HCO3 25.3 06/12/2023 1300   O2SAT 90.3 06/12/2023 1300     Coagulation Profile: No results for input(s): "INR", "PROTIME" in the last 168 hours.  Cardiac Enzymes: No results for input(s): "CKTOTAL", "CKMB", "CKMBINDEX", "TROPONINI" in the last 168 hours.  HbA1C: Hgb A1c MFr Bld  Date/Time Value Ref Range Status  06/12/2023 01:03 PM 7.9 (H) 4.8 - 5.6 % Final    Comment:    (NOTE) Pre diabetes:           5.7%-6.4%  Diabetes:              >6.4%  Glycemic control for   <7.0% adults with diabetes     CBG: Recent Labs  Lab 06/12/23 1750 06/12/23 1828 06/12/23 2040 06/12/23 2156 06/13/23 0726  GLUCAP 457* 355* 283* 249* 200*    Critical care time: NA     Duaine German, MD [facility] Office: [phone]   See Amion for personal pager PCCM on call pager [phone] until 7pm. Please call Elink 7p-7a. [phone]

## 2023-06-13 NOTE — Progress Notes (Signed)
 Rounding Note    Patient Name: Corey [name] Date of Encounter: 06/13/2023  [facility] Cardiologist: Hazle Lites, MD   Subjective   He is still requiring high flow nasal cannula (35-40 L/min) but he states he feels like his breathing is a little better today. He is using his incentive spirometer every hour. No chest pain.  Inpatient Medications    Scheduled Meds:  apixaban   5 mg Oral BID   atorvastatin   20 mg Oral Daily   budesonide  (PULMICORT ) nebulizer solution  0.25 mg Nebulization BID   buPROPion   150 mg Oral q morning   busPIRone   5 mg Oral QHS   Chlorhexidine  Gluconate Cloth  6 each Topical Daily   feeding supplement  237 mL Oral BID BM   flecainide   100 mg Oral BID   folic acid   1 mg Oral Daily   guaiFENesin   1,200 mg Oral BID   insulin  aspart  0-20 Units Subcutaneous TID WC   insulin  aspart  0-5 Units Subcutaneous QHS   insulin  aspart  4 Units Subcutaneous TID WC   insulin  glargine-yfgn  10 Units Subcutaneous Daily   ipratropium  0.5 mg Nebulization TID   irbesartan   300 mg Oral Daily   levalbuterol   0.63 mg Nebulization TID   mirtazapine   15 mg Oral QHS   pantoprazole   40 mg Oral Daily   sulfamethoxazole -trimethoprim   2 tablet Oral Q8H   Continuous Infusions:  ceFEPime  (MAXIPIME ) IV Stopped (06/13/23 0317)   PRN Meds: acetaminophen  **OR** acetaminophen , albuterol , ondansetron  **OR** ondansetron  (ZOFRAN ) IV, traZODone    Vital Signs    Vitals:   06/13/23 0800 06/13/23 0802 06/13/23 0805 06/13/23 0852  BP:   117/66   Pulse: 80 79 77 77  Resp: (!) 24 (!) 34 19 (!) 27  Temp:      TempSrc:      SpO2: 95% 94% 95% 96%  Weight:      Height:        Intake/Output Summary (Last 24 hours) at 06/13/2023 0916 Last data filed at 06/13/2023 0317 Gross per 24 hour  Intake 282.18 ml  Output 1150 ml  Net -867.82 ml      06/13/2023    5:00 AM 06/12/2023    1:00 PM 06/12/2023    5:32 AM  Last 3 Weights  Weight (lbs) 187 lb 9.8 oz 188 lb  11.4 oz 190 lb 7.6 oz  Weight (kg) 85.1 kg 85.6 kg 86.4 kg      Telemetry    Normal sinus rhythm with PVCs. Rtes in the 70s to 80s. - Personally Reviewed  ECG    No new ECG tracing today. - Personally Reviewed  Physical Exam   GEN: 89 year old Caucasian male. On high flow nasal cannula but no acute distress.   Neck: No JVD. Cardiac: RRR. No murmurs, rubs, or gallops.  Respiratory: On high flow nasal cannula. No increased work of breathing. Crackles noted in bilateral bases (left > right). GI: Soft, non-distended, and non-tender. MS: No lower extremity edema. No deformity. Neuro:  No focal deficits. Psych: Normal affect. Responds appropriately.  Labs    High Sensitivity Troponin:   Recent Labs  Lab 06/07/23 0909 06/07/23 1158  TROPONINIHS 16 16     Chemistry Recent Labs  Lab 06/11/23 0512 06/12/23 0535 06/13/23 0258  NA 137 132* 136  K 4.1 4.0 3.8  CL 99 96* 101  CO2 24 25 22   GLUCOSE 262* 351* 128*  BUN 33* 44*  50*  CREATININE 1.29* 1.16 1.31*  CALCIUM  8.8* 8.7* 8.7*  MG 2.5* 2.8* 2.8*  PROT 6.7 6.4* 5.8*  ALBUMIN 2.6* 2.6* 2.4*  AST 60* 51* 60*  ALT 34 33 45*  ALKPHOS 411* 372* 338*  BILITOT 1.1 1.0 0.7  GFRNONAA 54* >60 53*  ANIONGAP 14 11 13     Lipids No results for input(s): "CHOL", "TRIG", "HDL", "LABVLDL", "LDLCALC", "CHOLHDL" in the last 168 hours.  Hematology Recent Labs  Lab 06/11/23 0512 06/12/23 0535 06/13/23 0258  WBC 5.4 12.5* 14.0*  RBC 4.62 4.49 4.07*  HGB 13.2 12.8* 11.7*  HCT 40.6 39.5 35.8*  MCV 87.9 88.0 88.0  MCH 28.6 28.5 28.7  MCHC 32.5 32.4 32.7  RDW 13.5 13.4 13.5  PLT 286 358 313   Thyroid  No results for input(s): "TSH", "FREET4" in the last 168 hours.  BNP Recent Labs  Lab 06/11/23 0516 06/12/23 0942 06/13/23 0258  BNP 446.9* 243.2* 174.5*    DDimer No results for input(s): "DDIMER" in the last 168 hours.   Radiology    DG CHEST PORT 1 VIEW Result Date: 06/12/2023 CLINICAL DATA:  Shortness of breath EXAM:  PORTABLE CHEST 1 VIEW COMPARISON:  06/11/2023 FINDINGS: Cardiomediastinal contours are stable. Aortic atherosclerosis. Bilateral airspace disease, left greater than right, similar to prior study. No visible effusions or pneumothorax. No acute bony abnormality. IMPRESSION: Stable left greater than right bilateral airspace opacities. Electronically Signed   By: Janeece Mechanic M.D.   On: 06/12/2023 09:30    Cardiac Studies   Echocardiogram 06/07/2023: Impressions: 1. Left ventricular ejection fraction, by estimation, is 50 to 55%. Left  ventricular ejection fraction by 2D MOD biplane is 50.5 %. The left  ventricle has low normal function. The left ventricle has no regional wall  motion abnormalities. Left  ventricular diastolic parameters are consistent with Grade I diastolic  dysfunction (impaired relaxation).   2. Right ventricular systolic function is mildly reduced. The right  ventricular size is normal.   3. The mitral valve is grossly normal. Trivial mitral valve  regurgitation.   4. The aortic valve is tricuspid. Aortic valve regurgitation is not  visualized. Aortic valve sclerosis/calcification is present, without any  evidence of aortic stenosis.   5. The inferior vena cava is normal in size with greater than 50%  respiratory variability, suggesting right atrial pressure of 3 mmHg.    Patient Profile     89 y.o. male with a history of mild non-obstructive CAD on coronary CTA in 02/2019, chronic HFpEF, paroxysmal atrial fibrillation on Eliquis , hypertension, type 2 diabetes mellitus, iron deficiency anemia, BPH, and stage IV neuroendocrine tumor who was admitted on 06/07/2023 for acute hypoxic respiratory failure secondary to community acquired pneumonia and possible CHF. He was started on IV antibiotics and initially had some improvement. However, he then developed worsening respiratory distress and Cardiology was consulted for assistance with diuresis. He ultimately had to be transferred  to the stepdown on 1/14 due to continued need for high flow O2. He was seen by PCCM and was started on steroid taper for possible pneumonitis.   Assessment & Plan    Acute Hypoxic Respiratory Failure Patient was admitted for acute hypoxic respiratory failure felt to be secondary to CAP and possible CHF. He was treated with antibiotics and diuretics but increased O2 requirement and was transferred to step down unit on 1/14. Pulmonology was consulted and differential includes pneumonia, pneumonitis due to Everolimus , and asymmetric cardiogenic pulmonary edema. He was started on a steroid taper.  CRP elevated at 4.8 on 1/14 but Sed Rate normal.  - Still requiring high flow nasal cannula (35-40 L/min) but feeling a little better today. - See recommendations regarding CHF below. - Otherwise, management per [facility] and PCCM.  Acute on Chronic Diastolic CHF BNP 098 >> 446 >> 243 >> 174. Repeat chest x-ray today shows persistent bilateral airspace opacities (left > right) but no appreciable change from prior imaging. Echo showed LVEF of 50-55% with normal wall motion and grade 1 diastolic dysfunction as well as mildly reduced RV function. He has received a couple of doses of IV Lasix  (last dose 1/14). Documented urinary output of 1.15 L yesterday but only net negative 408 mL this admission. Creatinine up today and BUN has slowly been increasing the last few days. - He is still requiring high flow nasal cannula as above. However, he does not look volume overloaded on exam. - Will hold off on any additional diuretics for now given worsening renal function.  Non-Obstructive CAD Noted on coronary CTA in 02/2019. - No chest pain. - No aspirin due to need for full anticoagulation. - Continue statin.  Paroxysmal Atrial Fibrillation Maintaining sinus rhythm. Rates in the 70s to 80s. - Continue home Flecainide  100mg  twice daily. - Continue Eliquis  5mg  twice daily.  Hypertension BP soft at times but mostly  well controlled.  - Continue Irbesartan  300mg  daily.  Mild Renal Insufficiency CKD Stage II Creatinine 1.11 on admission. Baseline around 0.9 to 1.1.  - Creatinine slightly up at 1.31 today after Lasix  yesterday. BUN has also been increasing over the last several days 17 >> 21 >> 33 >> 44 >> 50. - Continue to monitor closely.   Otherwise, per primary team: - Community acquired pneumonia - Elevated LFTs - Hyperlipidemia - Type 2 diabetes  - GERD - Anemia - BPH - Stage IV neuroendocrine cancer - Anxiety/ depression    For questions or updates, please contact [facility] Please consult [email or web address] for contact info under        Signed, Delissa Silba E Mong Neal, PA-C  06/13/2023, 9:16 AM

## 2023-06-13 NOTE — Progress Notes (Signed)
 Progress Note   Patient: Corey [name] FAO:[id number] DOB: 12/01/1934 DOA: 06/07/2023     5 DOS: the patient was seen and examined on 06/13/2023   Brief hospital course: 89yo with h/o afib on Eliquis , BPH, and recently diagnosed (03/2023) stage IV metastatic neuroendocrine tumor with unknown primary who presented on 1/9 with weakness and SOB.  He was recently started on Afinitor  but was unable to tolerate it due to significant fatigue and cough.  He was awaiting consultation with Dr. Dortha Gauss for Lutathera therapy but missed his appointment because of this hospitalization.  He saw his oncologist on 1/6 and CXR showed pneumonia.  He was then seen by his cardiologist on 1/7 and there was concern for heart failure so he was started on Lasix .  He had a good response to Lasix  and his peripheral edema resolved however his cough and dyspnea continued.  Given this he presented to the ED and further workup showed evidence of progressive pulmonary consolidation.  He was started on azithromycin  and Rocephin  and repeat CXR showed "Interval worsening multifocal left lung pneumonia". Chest CT showed no evidence of PE but did show possible pulmonary edema.  He was given IV Solu-Medrol  and a dose of IV Lasix  and cardiology has been consulted.  Pulmonary added IV steroids 125 mg every 12 for 5 more doses.  Respiratory status worsened on 1/14 and he was transferred to the stepdown unit and placed on heated high flow nasal cannula 60 L.  Antibiotics escalated to cefepime  and pulmonary added Bactrim .  Inflammatory markers being checked and cardiology give another dose of diuresis.    Assessment and Plan:  Acute Respiratory Failure with Hypoxia  Presented with acute respiratory failure with hypoxia and had a reported pulse oximetry of 85% on room air at home Ddx includes PNA, pneumonitis (had 1 dose of everolimus  recently), cardiogenic edema Most likely etiology of his hypoxia due to community-acquired pneumonia; PE  was considered in the setting of his malignancy but is unlikely given that the patient is already anticoagulated Eliquis   He acutely decompensated on 1/14 and was transferred to SDU, is currently on 60L HFNC O2, FiO2 80% Repeat CXR on 1/15 showed persistent B airspace opacities, L > R, unchanged Pulmonary consulted Given Solumedrol and Lasix  40 mg IV Also added Xopenex /Atrovent  and also on Budesonide  0.25 mg twice daily Supplemental oxygen via nasal cannula wean O2 as tolerated Will need an ambulatory home O2 screen and repeat chest x-ray prior to discharge  Community-Acquired Pneumonia Has had progressive cough, hypoxia, multilobar pneumonia seen on chest x-ray and has been progressive when compared to x-ray from 06/04/2023 No fever, leukocytosis, or other evidence of sepsis Completed Azithromycin /Ceftriaxone , s/p 1 dose of Vanc, started on Cefepime  and Bactrim  on 1/14 Started on Solumedrol 120 mg IV q12 x 3 days with plan for extended steroid taper with prednisone  (60 mg starting 1/16) Incentive spirometer, and flutter valve and Guaifenesin  Repeat CXR in AM  Acute on Chronic Diastolic CHF He had responded well to several days of p.o. Lasix , currently has no evidence of fluid overload. Holding Lasix  at this time Cardiology is following Echocardiogram 1/9 with EF of 50-55% with grade 1 diastolic dysfunction   Bacteremia, factitious Patient grew Staph hominis in 1 culture but other culture negative and repeat cultures negative x 5 days Very likely contaminant   Stage IV Neuroendocrine Tumor Dr. Maryalice Smaller consulted on 1/13 and recommended continued antibiotics for now Started on Everolimus  (Afinitor ) on 12/18, stopped on 1/6 due to poor tolerance  He is now planning to start bimonthly infusions with Lutathera Will need outpatient follow-up with Dr. Maryalice Smaller and Dr. Dortha Gauss as an outpatient once stable for dc Consider palliative care consultation   Paroxysmal Atrial Fibrillation Continue  Flecainide   Continue Eliquis   Stage 3a CKD Baseline creatinine appears to be 1.1-1.3 Appears to be stable at this time Attempt to avoid nephrotoxic medications Recheck BMP in AM   Mildly abnormal LFTs Not overly concerning Continue to follow   Hypertension Hold valsartan /HCTZ If BP is uptrending, can resume hole amlodipine   Diabetes Mellitus Type 2 with Hyperglycemia  HbA1c was 7.9, at goal of <8 based on age Not on home meds Glucose significantly elevated from steroids Added Semglee  10 units + 4 units qAC + resistant-scale SSI with significant improvement  Depression and Anxiety Continue buproprion, buspirone , and mirtazepine  Normocytic Anemia Checked Anemia Panel showed an iron level of 30, UIBC 258, TIBC 288, saturation history of 10%, ferritin level of 84, folate of 4.9 and vitamin B12 level of 888 Started Folic Acid  1 mg po Daily  Appears to be relatively stable Continue to monitor  HLD Continue atorvastatin  for now Monitor LFTs, as this may need to be help if liver function worsens  GERD/GI Prophylaxis Continue PPI      Consultants: Oncology Pulmonology Cardiology [facility] team DM coordinator  Procedures: Echocardiogram 1/9  Antibiotics: Azithromycin  1/9-13 Ceftriaxone  1/9-14 Cefepime  1/14- Bactrim  1/14- Vancomycin  x 1  30 Day Unplanned Readmission Risk Score    Flowsheet Row ED to Hosp-Admission (Current) from 06/07/2023 in [facility]-ICU/STEPDOWN  30 Day Unplanned Readmission Risk Score (%) 19.36 Filed at 06/13/2023 0401       This score is the patient's risk of an unplanned readmission within 30 days of being discharged (0 -100%). The score is based on dignosis, age, lab data, medications, orders, and past utilization.   Low:  0-14.9   Medium: 15-21.9   High: 22-29.9   Extreme: 30 and above           Subjective: Reports feeling a little better today.  Still fatigued but less SOB.   Objective: Vitals:   06/13/23  1400 06/13/23 1421  BP: 121/61   Pulse: 79   Resp: (!) 26   Temp:    SpO2: 93% 94%    Intake/Output Summary (Last 24 hours) at 06/13/2023 1430 Last data filed at 06/13/2023 1406 Gross per 24 hour  Intake 662.18 ml  Output 1475 ml  Net -812.82 ml   Filed Weights   06/12/23 0532 06/12/23 1300 06/13/23 0500  Weight: 86.4 kg 85.6 kg 85.1 kg    Exam:  General:  Appears calm and comfortable and is in NAD, on HFNC O2 Eyes:  EOMI, normal lids, iris ENT:  hard of hearing, grossly normal lips & tongue, mmm Neck:  no LAD, masses or thyromegaly Cardiovascular:  RRR, no m/r/g. No LE edema.  Respiratory:   CTA bilaterally with no wheezes/rales/rhonchi. Mildly increased respiratory effort on HFNC O2 Abdomen:  soft, NT, ND Skin:  no rash or induration seen on limited exam Musculoskeletal:  grossly normal tone BUE/BLE, good ROM, no bony abnormality Psychiatric:  grossly normal mood and affect, speech fluent and appropriate, AOx3 Neurologic:  CN 2-12 grossly intact, moves all extremities in coordinated fashion  Data Reviewed: I have reviewed the patient's lab results since admission.  Pertinent labs for today include:   Glucose 128 BUN 50/Creatinine 1.31/GFR 53 AP 338 Albumin 2.4 AST 60/ALT 45 BNP 174.5 WBC 14 Hgb  11.7    Family Communication: None present; he declined to have me call family today  Disposition: Status is: Inpatient Remains inpatient appropriate because: critically ill  Total critical care time: 50 minutes Critical care time was exclusive of separately billable procedures and treating other patients. Critical care was necessary to treat or prevent imminent or life-threatening deterioration. Critical care was time spent personally by me on the following activities: development of treatment plan with patient and/or surrogate as well as nursing, discussions with consultants, evaluation of patient's response to treatment, examination of patient, obtaining history from  patient or surrogate, ordering and performing treatments and interventions, ordering and review of laboratory studies, ordering and review of radiographic studies, pulse oximetry and re-evaluation of patient's condition.     Unresulted Labs (From admission, onward)     Start     Ordered   06/12/23 1111  Fungitell Beta-D-Glucan  Once,   R       Question:  Specimen collection method  Answer:  Lab=Lab collect   06/12/23 1110   06/12/23 0923  Hypersensitivity Pneumonitis  Once,   R       Question:  Specimen collection method  Answer:  Lab=Lab collect   06/12/23 0981   Unscheduled  CBC with Differential/Platelet  Tomorrow morning,   R       Question:  Specimen collection method  Answer:  Lab=Lab collect   06/13/23 1430   Unscheduled  Comprehensive metabolic panel  Tomorrow morning,   R       Question:  Specimen collection method  Answer:  Lab=Lab collect   06/13/23 1430             Author: Lorita Rosa, MD 06/13/2023 2:30 PM  For on call review [email or web address].

## 2023-06-13 NOTE — Plan of Care (Signed)
   Problem: Education: Goal: Knowledge of General Education information will improve Description Including pain rating scale, medication(s)/side effects and non-pharmacologic comfort measures Outcome: Progressing

## 2023-06-13 NOTE — TOC Progression Note (Addendum)
 Transition of Care [facility]) - Progression Note    Patient Details  Name: Corey [name] MRN: [id number] Date of Birth: 01/08/1935  [facility]) CM/SW Contact  Naoma Bacca, RN Phone Number: 06/13/2023, 6:37 PM  Clinical Narrative:   Per chart review patient currently in [facility] SDU for pneumonia bilateral lungs, on HFNC.   TOC following for discharge needs, potential for home oxygen.         Expected Discharge Plan and Services                                               Social Determinants of Health (SDOH) Interventions SDOH Screenings   Food Insecurity: No Food Insecurity (06/07/2023)  Housing: Low Risk  (06/07/2023)  Transportation Needs: No Transportation Needs (06/07/2023)  Utilities: Not At Risk (06/07/2023)  Social Connections: Moderately Integrated (06/07/2023)  Tobacco Use: Medium Risk (06/07/2023)    Readmission Risk Interventions    06/13/2023    6:36 PM 06/08/2023    3:00 PM  Readmission Risk Prevention Plan  Post Dischage Appt  Complete  Medication Screening  Complete  Transportation Screening Complete Complete  PCP or Specialist Appt within 3-5 Days Complete   HRI or Home Care Consult Complete   Social Work Consult for Recovery Care Planning/Counseling Complete   Palliative Care Screening Not Applicable   Medication Review Oceanographer) Complete

## 2023-06-13 NOTE — Progress Notes (Signed)
   06/13/23 1421  Oxygen Therapy/Pulse Ox  O2 Device (S)  HHFNC  O2 Therapy Oxygen humidified  Heater temperature 87.8 F (31 C)  O2 Flow Rate (L/min) (S)  50 L/min (Weaned to 50%.)  FiO2 (%) (S)  75 % (Weaned to 75%.)

## 2023-06-13 NOTE — Plan of Care (Signed)
  Problem: Education: Goal: Knowledge of General Education information will improve Description: Including pain rating scale, medication(s)/side effects and non-pharmacologic comfort measures 06/13/2023 0705 by Derald Flattery, RN Outcome: Progressing 06/13/2023 0658 by Derald Flattery, RN Outcome: Progressing

## 2023-06-14 ENCOUNTER — Inpatient Hospital Stay (HOSPITAL_COMMUNITY): Payer: Medicare Other

## 2023-06-14 ENCOUNTER — Inpatient Hospital Stay (HOSPITAL_COMMUNITY): Admission: RE | Admit: 2023-06-14 | Payer: Medicare Other | Source: Ambulatory Visit

## 2023-06-14 DIAGNOSIS — D3A8 Other benign neuroendocrine tumors: Secondary | ICD-10-CM | POA: Diagnosis not present

## 2023-06-14 DIAGNOSIS — Z515 Encounter for palliative care: Secondary | ICD-10-CM

## 2023-06-14 DIAGNOSIS — C787 Secondary malignant neoplasm of liver and intrahepatic bile duct: Secondary | ICD-10-CM | POA: Diagnosis not present

## 2023-06-14 DIAGNOSIS — E43 Unspecified severe protein-calorie malnutrition: Secondary | ICD-10-CM | POA: Insufficient documentation

## 2023-06-14 DIAGNOSIS — J189 Pneumonia, unspecified organism: Secondary | ICD-10-CM | POA: Diagnosis not present

## 2023-06-14 DIAGNOSIS — J9601 Acute respiratory failure with hypoxia: Secondary | ICD-10-CM | POA: Diagnosis not present

## 2023-06-14 DIAGNOSIS — Z79899 Other long term (current) drug therapy: Secondary | ICD-10-CM

## 2023-06-14 DIAGNOSIS — R0602 Shortness of breath: Secondary | ICD-10-CM

## 2023-06-14 DIAGNOSIS — J810 Acute pulmonary edema: Secondary | ICD-10-CM | POA: Diagnosis not present

## 2023-06-14 DIAGNOSIS — Z7189 Other specified counseling: Secondary | ICD-10-CM

## 2023-06-14 LAB — CBC WITH DIFFERENTIAL/PLATELET
Abs Immature Granulocytes: 0.18 10*3/uL — ABNORMAL HIGH (ref 0.00–0.07)
Basophils Absolute: 0 10*3/uL (ref 0.0–0.1)
Basophils Relative: 0 %
Eosinophils Absolute: 0.1 10*3/uL (ref 0.0–0.5)
Eosinophils Relative: 1 %
HCT: 38.6 % — ABNORMAL LOW (ref 39.0–52.0)
Hemoglobin: 12.6 g/dL — ABNORMAL LOW (ref 13.0–17.0)
Immature Granulocytes: 1 %
Lymphocytes Relative: 4 %
Lymphs Abs: 0.8 10*3/uL (ref 0.7–4.0)
MCH: 28.8 pg (ref 26.0–34.0)
MCHC: 32.6 g/dL (ref 30.0–36.0)
MCV: 88.1 fL (ref 80.0–100.0)
Monocytes Absolute: 2.1 10*3/uL — ABNORMAL HIGH (ref 0.1–1.0)
Monocytes Relative: 12 %
Neutro Abs: 15 10*3/uL — ABNORMAL HIGH (ref 1.7–7.7)
Neutrophils Relative %: 82 %
Platelets: 369 10*3/uL (ref 150–400)
RBC: 4.38 MIL/uL (ref 4.22–5.81)
RDW: 13.7 % (ref 11.5–15.5)
WBC: 18.2 10*3/uL — ABNORMAL HIGH (ref 4.0–10.5)
nRBC: 0 % (ref 0.0–0.2)

## 2023-06-14 LAB — FUNGITELL BETA-D-GLUCAN
Fungitell Value:: 81.64 pg/mL
Result Name:: POSITIVE — AB

## 2023-06-14 LAB — COMPREHENSIVE METABOLIC PANEL
ALT: 60 U/L — ABNORMAL HIGH (ref 0–44)
AST: 69 U/L — ABNORMAL HIGH (ref 15–41)
Albumin: 2.7 g/dL — ABNORMAL LOW (ref 3.5–5.0)
Alkaline Phosphatase: 351 U/L — ABNORMAL HIGH (ref 38–126)
Anion gap: 11 (ref 5–15)
BUN: 58 mg/dL — ABNORMAL HIGH (ref 8–23)
CO2: 25 mmol/L (ref 22–32)
Calcium: 8.8 mg/dL — ABNORMAL LOW (ref 8.9–10.3)
Chloride: 99 mmol/L (ref 98–111)
Creatinine, Ser: 1.47 mg/dL — ABNORMAL HIGH (ref 0.61–1.24)
GFR, Estimated: 46 mL/min — ABNORMAL LOW (ref >60)
Glucose, Bld: 94 mg/dL (ref 70–99)
Potassium: 3.7 mmol/L (ref 3.5–5.1)
Sodium: 135 mmol/L (ref 135–145)
Total Bilirubin: 0.9 mg/dL (ref 0.0–1.2)
Total Protein: 6.5 g/dL (ref 6.5–8.1)

## 2023-06-14 LAB — GLUCOSE, CAPILLARY
Glucose-Capillary: 75 mg/dL (ref 70–99)
Glucose-Capillary: 176 mg/dL — ABNORMAL HIGH (ref 70–99)
Glucose-Capillary: 319 mg/dL — ABNORMAL HIGH (ref 70–99)
Glucose-Capillary: 135 mg/dL — ABNORMAL HIGH (ref 70–99)

## 2023-06-14 LAB — URINALYSIS, ROUTINE W REFLEX MICROSCOPIC
Bacteria, UA: NONE SEEN
Bilirubin Urine: NEGATIVE
Glucose, UA: 50 mg/dL — AB
Hgb urine dipstick: NEGATIVE
Ketones, ur: NEGATIVE mg/dL
Leukocytes,Ua: NEGATIVE
Nitrite: NEGATIVE
Protein, ur: 30 mg/dL — AB
Specific Gravity, Urine: 1.025 (ref 1.005–1.030)
pH: 5 (ref 5.0–8.0)

## 2023-06-14 LAB — FERRITIN: Ferritin: 135 ng/mL (ref 24–336)

## 2023-06-14 LAB — CK: Total CK: 139 U/L (ref 49–397)

## 2023-06-14 LAB — LACTATE DEHYDROGENASE: LDH: 416 U/L — ABNORMAL HIGH (ref 98–192)

## 2023-06-14 LAB — C-REACTIVE PROTEIN: CRP: 1.9 mg/dL — ABNORMAL HIGH (ref <1.0)

## 2023-06-14 LAB — PROCALCITONIN: Procalcitonin: 0.21 ng/mL

## 2023-06-14 LAB — SEDIMENTATION RATE: Sed Rate: 12 mm/h (ref 0–16)

## 2023-06-14 MED ORDER — MORPHINE SULFATE (PF) 2 MG/ML IV SOLN: 2.0000 mg | INTRAVENOUS | Status: DC | PRN

## 2023-06-14 MED ORDER — MORPHINE SULFATE (CONCENTRATE) 10 MG /0.5 ML PO SOLN
2.0000 mg | ORAL | Status: DC | PRN
  Administered 2023-06-14 – 2023-06-15 (×2): 2 mg via ORAL
  Filled 2023-06-14 (×2): qty 0.5

## 2023-06-14 MED ORDER — SODIUM CHLORIDE 0.9 % IV SOLN
100.0000 mg | Freq: Every day | INTRAVENOUS | Status: DC
  Administered 2023-06-14 – 2023-06-19 (×6): 100 mg via INTRAVENOUS
  Filled 2023-06-14 (×6): qty 5

## 2023-06-14 MED ORDER — ADULT MULTIVITAMIN W/MINERALS CH
1.0000 | ORAL_TABLET | Freq: Every day | ORAL | Status: DC
  Administered 2023-06-14 – 2023-06-18 (×5): 1 via ORAL
  Filled 2023-06-14 (×6): qty 1

## 2023-06-14 MED ORDER — MORPHINE (PF) INJECTION FOR INHALATION 10 MG/ML
10.0000 mg | RESPIRATORY_TRACT | Status: DC | PRN
  Administered 2023-06-14 (×2): 10 mg via RESPIRATORY_TRACT
  Filled 2023-06-14: qty 1

## 2023-06-14 NOTE — Plan of Care (Signed)

## 2023-06-14 NOTE — Progress Notes (Signed)
Corey [name]   DOB:[birth year]   ZO#:[id number]   JWJ#:[id number]  MED/ONC FOLLOW UP NOTE   Subjective: Patient's hypoxia got worse and was transferred to ICU step down unit. He is on high flow  oxygen with FIO2 45%. He does feel mild SOB, no fever, eats OK.   Objective:  Vitals:   06/14/23 1900 06/14/23 2000  BP: (!) 115/59   Pulse: 72   Resp: (!) 32   Temp:  97.9 F (36.6 C)  SpO2: 92%     Body mass index is 25.33 kg/m.  Intake/Output Summary (Last 24 hours) at 06/14/2023 2139 Last data filed at 06/14/2023 1700 Gross per 24 hour  Intake 276.78 ml  Output 800 ml  Net -523.22 ml     Sclerae unicteric  Oropharynx clear  No peripheral adenopathy  Lungs(+) b/l rales   Heart regular rate and rhythm  Abdomen benign  MSK no focal spinal tenderness, no peripheral edema  Neuro nonfocal    CBG (last 3)  Recent Labs    06/14/23 0728 06/14/23 1126 06/14/23 1516  GLUCAP 75 176* 319*     Labs:   Urine Studies No results for input(s): "UHGB", "CRYS" in the last 72 hours.  Invalid input(s): "UACOL", "UAPR", "USPG", "UPH", "UTP", "UGL", "UKET", "UBIL", "UNIT", "UROB", "ULEU", "UEPI", "UWBC", "URBC", "UBAC", "CAST", "UCOM", "BILUA"  Basic Metabolic Panel: Recent Labs  Lab 06/09/23 0841 06/10/23 0610 06/11/23 0512 06/12/23 0535 06/13/23 0258 06/14/23 0251  NA 134* 136 137 132* 136 135  K 3.8 4.2 4.1 4.0 3.8 3.7  CL 99 97* 99 96* 101 99  CO2 24 29 24 25 22 25   GLUCOSE 193* 124* 262* 351* 128* 94  BUN 17 21 33* 44* 50* 58*  CREATININE 0.94 1.01 1.29* 1.16 1.31* 1.47*  CALCIUM 8.4* 8.5* 8.8* 8.7* 8.7* 8.8*  MG 2.2 2.3 2.5* 2.8* 2.8*  --   PHOS 2.8 3.3 5.0* 4.0 3.9  --    GFR Estimated Creatinine Clearance: 40.8 mL/min (A) (by C-G formula based on SCr of 1.47 mg/dL (H)). Liver Function Tests: Recent Labs  Lab 06/10/23 0610 06/11/23 0512 06/12/23 0535 06/13/23 0258 06/14/23 0251  AST 51* 60* 51* 60* 69*  ALT 30 34 33 45* 60*  ALKPHOS 378* 411* 372*  338* 351*  BILITOT 1.1 1.1 1.0 0.7 0.9  PROT 6.1* 6.7 6.4* 5.8* 6.5  ALBUMIN 2.5* 2.6* 2.6* 2.4* 2.7*   No results for input(s): "LIPASE", "AMYLASE" in the last 168 hours.  No results for input(s): "AMMONIA" in the last 168 hours. Coagulation profile No results for input(s): "INR", "PROTIME" in the last 168 hours.  CBC: Recent Labs  Lab 06/10/23 0610 06/11/23 0512 06/12/23 0535 06/13/23 0258 06/14/23 0251  WBC 7.9 5.4 12.5* 14.0* 18.2*  NEUTROABS 5.5 4.8 11.4* 12.2* 15.0*  HGB 12.3* 13.2 12.8* 11.7* 12.6*  HCT 36.1* 40.6 39.5 35.8* 38.6*  MCV 88.7 87.9 88.0 88.0 88.1  PLT 219 286 358 313 369   Cardiac Enzymes: Recent Labs  Lab 06/14/23 1001  CKTOTAL 139   BNP: Invalid input(s): "POCBNP" CBG: Recent Labs  Lab 06/13/23 1608 06/13/23 2134 06/14/23 0728 06/14/23 1126 06/14/23 1516  GLUCAP 247* 92 75 176* 319*   D-Dimer No results for input(s): "DDIMER" in the last 72 hours. Hgb A1c Recent Labs    06/12/23 1303  HGBA1C 7.9*   Lipid Profile No results for input(s): "CHOL", "HDL", "LDLCALC", "TRIG", "CHOLHDL", "LDLDIRECT" in the last 72 hours. Thyroid function studies No results for input(s): "  TSH", "T4TOTAL", "T3FREE", "THYROIDAB" in the last 72 hours.  Invalid input(s): "FREET3" Anemia work up Recent Labs    06/14/23 1001  FERRITIN 135   Microbiology Recent Results (from the past 240 hours)  Culture, blood (routine x 2)     Status: None   Collection Time: 06/07/23 11:58 AM   Specimen: BLOOD  Result Value Ref Range Status   Specimen Description   Final    BLOOD LEFT ANTECUBITAL Performed at [facility], [address] [place], [place] [address]    Special Requests   Final    BOTTLES DRAWN AEROBIC AND ANAEROBIC Blood Culture adequate volume Performed at [facility], [address], [place], [place] [address]    Culture   Final    NO GROWTH 5 DAYS Performed at [facility], [address]  [place], [place] [address]    Report Status 06/12/2023 FINAL  Final  Culture, blood (routine x 2)     Status: Abnormal   Collection Time: 06/07/23 11:58 AM   Specimen: BLOOD  Result Value Ref Range Status   Specimen Description   Final    BLOOD RIGHT ANTECUBITAL Performed at [facility], [address] [place], [place] [address]    Special Requests   Final    BOTTLES DRAWN AEROBIC AND ANAEROBIC Blood Culture adequate volume Performed at [facility], [address] [place], [place] [address]    Culture  Setup Time   Final    GRAM POSITIVE COCCI IN BOTH AEROBIC AND ANAEROBIC BOTTLES CRITICAL RESULT CALLED TO, READ BACK BY AND VERIFIED WITH: PHARMD SYDNEY, D [phone] FCP    Culture (A)  Final    STAPHYLOCOCCUS HOMINIS THE SIGNIFICANCE OF ISOLATING THIS ORGANISM FROM A SINGLE SET OF BLOOD CULTURES WHEN MULTIPLE SETS ARE DRAWN IS UNCERTAIN. PLEASE NOTIFY THE MICROBIOLOGY DEPARTMENT WITHIN ONE WEEK IF SPECIATION AND SENSITIVITIES ARE REQUIRED. Performed at [facility], [address] [place], [place] [address]    Report Status 06/09/2023 FINAL  Final  Blood Culture ID Panel (Reflexed)     Status: Abnormal   Collection Time: 06/07/23 11:58 AM  Result Value Ref Range Status   Enterococcus faecalis NOT DETECTED NOT DETECTED Final   Enterococcus Faecium NOT DETECTED NOT DETECTED Final   Listeria monocytogenes NOT DETECTED NOT DETECTED Final   Staphylococcus species DETECTED (A) NOT DETECTED Final    Comment: CRITICAL RESULT CALLED TO, READ BACK BY AND VERIFIED WITH: PHARMD SYDNEY, D [phone] FCP    Staphylococcus aureus (BCID) NOT DETECTED NOT DETECTED Final   Staphylococcus epidermidis NOT DETECTED NOT DETECTED Final   Staphylococcus lugdunensis NOT DETECTED NOT DETECTED Final   Streptococcus species NOT DETECTED NOT DETECTED Final   Streptococcus agalactiae NOT DETECTED NOT DETECTED Final   Streptococcus pneumoniae NOT DETECTED NOT  DETECTED Final   Streptococcus pyogenes NOT DETECTED NOT DETECTED Final   A.calcoaceticus-baumannii NOT DETECTED NOT DETECTED Final   Bacteroides fragilis NOT DETECTED NOT DETECTED Final   Enterobacterales NOT DETECTED NOT DETECTED Final   Enterobacter cloacae complex NOT DETECTED NOT DETECTED Final   Escherichia coli NOT DETECTED NOT DETECTED Final   Klebsiella aerogenes NOT DETECTED NOT DETECTED Final   Klebsiella oxytoca NOT DETECTED NOT DETECTED Final   Klebsiella pneumoniae NOT DETECTED NOT DETECTED Final   Proteus species NOT DETECTED NOT DETECTED Final   Salmonella species NOT DETECTED NOT DETECTED Final   Serratia marcescens NOT DETECTED NOT DETECTED Final  Haemophilus influenzae NOT DETECTED NOT DETECTED Final   Neisseria meningitidis NOT DETECTED NOT DETECTED Final   Pseudomonas aeruginosa NOT DETECTED NOT DETECTED Final   Stenotrophomonas maltophilia NOT DETECTED NOT DETECTED Final   Candida albicans NOT DETECTED NOT DETECTED Final   Candida auris NOT DETECTED NOT DETECTED Final   Candida glabrata NOT DETECTED NOT DETECTED Final   Candida krusei NOT DETECTED NOT DETECTED Final   Candida parapsilosis NOT DETECTED NOT DETECTED Final   Candida tropicalis NOT DETECTED NOT DETECTED Final   Cryptococcus neoformans/gattii NOT DETECTED NOT DETECTED Final    Comment: Performed at [facility], [address] [place], [place] [address]  Culture, blood (Routine X 2) w Reflex to ID Panel     Status: None   Collection Time: 06/08/23 10:12 AM   Specimen: BLOOD LEFT HAND  Result Value Ref Range Status   Specimen Description   Final    BLOOD LEFT HAND Performed at [facility], [address] [place], [place] [address]    Special Requests   Final    BOTTLES DRAWN AEROBIC ONLY Blood Culture results may not be optimal due to an inadequate volume of blood received in culture bottles Performed at [facility], [address], [place], [place] [address]     Culture   Final    NO GROWTH 5 DAYS Performed at [facility], [address], [place], [place] [address]    Report Status 06/13/2023 FINAL  Final  Culture, blood (Routine X 2) w Reflex to ID Panel     Status: None   Collection Time: 06/08/23 10:12 AM   Specimen: BLOOD RIGHT HAND  Result Value Ref Range Status   Specimen Description   Final    BLOOD RIGHT HAND Performed at [facility], [address] [place], [place] [address]    Special Requests   Final    BOTTLES DRAWN AEROBIC ONLY Blood Culture results may not be optimal due to an inadequate volume of blood received in culture bottles Performed at [facility], [address], [place], [place] [address]    Culture   Final    NO GROWTH 5 DAYS Performed at [facility], [address], [place], [place] [address]    Report Status 06/13/2023 FINAL  Final  Respiratory (~20 pathogens) panel by PCR     Status: None   Collection Time: 06/08/23  6:34 PM   Specimen: Nasopharyngeal Swab; Respiratory  Result Value Ref Range Status   Adenovirus NOT DETECTED NOT DETECTED Final   Coronavirus 229E NOT DETECTED NOT DETECTED Final    Comment: (NOTE) The Coronavirus on the Respiratory Panel, DOES NOT test for the novel  Coronavirus (2019 nCoV)    Coronavirus HKU1 NOT DETECTED NOT DETECTED Final   Coronavirus NL63 NOT DETECTED NOT DETECTED Final   Coronavirus OC43 NOT DETECTED NOT DETECTED Final   Metapneumovirus NOT DETECTED NOT DETECTED Final   Rhinovirus / Enterovirus NOT DETECTED NOT DETECTED Final   Influenza A NOT DETECTED NOT DETECTED Final   Influenza B NOT DETECTED NOT DETECTED Final   Parainfluenza Virus 1 NOT DETECTED NOT DETECTED Final   Parainfluenza Virus 2 NOT DETECTED NOT DETECTED Final   Parainfluenza Virus 3 NOT DETECTED NOT DETECTED Final   Parainfluenza Virus 4 NOT DETECTED NOT DETECTED Final   Respiratory Syncytial Virus NOT DETECTED NOT DETECTED Final   Bordetella pertussis NOT  DETECTED NOT DETECTED Final  Bordetella Parapertussis NOT DETECTED NOT DETECTED Final   Chlamydophila pneumoniae NOT DETECTED NOT DETECTED Final   Mycoplasma pneumoniae NOT DETECTED NOT DETECTED Final    Comment: Performed at [facility], [address] [place], [place] [address]  SARS Coronavirus 2 by RT PCR (hospital order, performed in [facility] hospital lab) *cepheid single result test* Anterior Nasal Swab     Status: None   Collection Time: 06/09/23  7:25 PM   Specimen: Anterior Nasal Swab  Result Value Ref Range Status   SARS Coronavirus 2 by RT PCR NEGATIVE NEGATIVE Final    Comment: (NOTE) SARS-CoV-2 target nucleic acids are NOT DETECTED.  The SARS-CoV-2 RNA is generally detectable in upper and lower respiratory specimens during the acute phase of infection. The lowest concentration of SARS-CoV-2 viral copies this assay can detect is 250 copies / mL. A negative result does not preclude SARS-CoV-2 infection and should not be used as the sole basis for treatment or other patient management decisions.  A negative result may occur with improper specimen collection / handling, submission of specimen other than nasopharyngeal swab, presence of viral mutation(s) within the areas targeted by this assay, and inadequate number of viral copies (<250 copies / mL). A negative result must be combined with clinical observations, patient history, and epidemiological information.  Fact Sheet for Patients:   RoadLapTop.co.za  Fact Sheet for Healthcare Providers: http://kim-miller.com/  This test is not yet approved or  cleared by the [place] FDA and has been authorized for detection and/or diagnosis of SARS-CoV-2 by FDA under an Emergency Use Authorization (EUA).  This EUA will remain in effect (meaning this test can be used) for the duration of the COVID-19 declaration under Section 564(b)(1) of the Act, 21 U.S.C. section  360bbb-3(b)(1), unless the authorization is terminated or revoked sooner.  Performed at [facility], [address], [place], [place] [address]   MRSA Next Gen by PCR, Nasal     Status: None   Collection Time: 06/12/23  1:05 PM   Specimen: Nasal Mucosa; Nasal Swab  Result Value Ref Range Status   MRSA by PCR Next Gen NOT DETECTED NOT DETECTED Final    Comment: (NOTE) The GeneXpert MRSA Assay (FDA approved for NASAL specimens only), is one component of a comprehensive MRSA colonization surveillance program. It is not intended to diagnose MRSA infection nor to guide or monitor treatment for MRSA infections. Test performance is not FDA approved in patients less than 85 years old. Performed at [facility], [address] [place], [place] [address]       Studies:  DG CHEST PORT 1 VIEW Result Date: 06/14/2023 CLINICAL DATA:  Dyspnea. EXAM: PORTABLE CHEST 1 VIEW COMPARISON:  June 13, 2023. FINDINGS: Stable cardiomediastinal silhouette. Stable bilateral lung opacities are noted, left greater than right, concerning for multifocal pneumonia or possibly edema. Bony thorax is unremarkable. IMPRESSION: Stable bilateral lung opacities as noted above. Electronically Signed   By: Lupita Raider M.D.   On: 06/14/2023 10:49   DG CHEST PORT 1 VIEW Result Date: 06/13/2023 CLINICAL DATA:  [id number] SOB (shortness of breath) [id number] EXAM: PORTABLE CHEST 1 VIEW COMPARISON:  06/12/2023 FINDINGS: Stable cardiomediastinal contours. Aortic atherosclerosis. Persistent bilateral airspace opacities, left worse than right, not appreciably changed. No pleural fluid collection. No pneumothorax. IMPRESSION: Persistent bilateral airspace opacities, left worse than right, not appreciably changed. Electronically Signed   By: Duanne Guess D.O.   On: 06/13/2023 09:26    Assessment:  89 y.o. male   Multifocal community-acquired pneumonia, vs virus or medication induced  pneumonitis Hypoxic respiratory failure Stage IV neuroendocrine tumor, status post first-line everolimus CHF with acute pulmonary edema HTN and AF    Plan:  -Patient has persistent hypoxia requiring high flow oxygen, overall worse than last week, not responding to IV antibiotics, antibiotics has broadened to cover PCP and fungal infection.   -Etiology including infectious, including virus infection, medication everolimus induced pneumonitis, cryptogenic organizing pneumonia etc.  -Spoke with Dr. Francine Graven, patient is a poor candidate for bronchoscopy due to his requirement of high flow oxygen, high risk of vent dependent after bronchoscopy. We discussed higher dose steroid, he plan to increase prednisone to 60mg  daily which I agree.  -pt has been seen by cardiology Dr. Rennis Golden also  -He does not have incurable neuroendocrine tumor with diffuse liver metastasis.  In general neuroendocrine tumor is not a very aggressive, I do not think his  pulmonary change his related to his malignancy. -If he does not respond to antibiotics and steroid in the next week, then we will discuss CODE STATUS and possible transition to comfort care, given his underlying metastatic neuroendocrine tumor -I will f/u next week.    [name], MD 06/14/2023

## 2023-06-14 NOTE — Consult Note (Signed)
Consultation Note Date: 06/14/2023   Patient Name: Corey [name]  DOB: Apr 02, 1935  MRN: [id number]  Age / Sex: 89 y.o., male   PCP: Emilio Aspen, MD Referring Physician: Jonah Blue, MD  Reason for Consultation: Establishing goals of care     Chief Complaint/History of Present Illness:   Patient is an 89 year old male with a past medical history of A-fib on Eliquis, BPH, and recently diagnosed stage IV metastatic neuroendocrine tumor with unknown primary who was admitted on 06/07/2023 for management of weakness and shortness of breath.  Patient had recently been started Afinitor for cancer management.  This was discontinued as patient was unable to tolerate in setting of significant fatigue and cough.  Patient was referred to Dr. Ty Hilts for [facility] therapy though has been hospitalized and unable to attend this appointment.  During hospitalization patient has had worsening acute respiratory failure with noted hypoxia.  Pulmonology following to assist with management.  Oncology following in setting of metastatic neuroendocrine tumor.  Cardiology consulted for concerns of cardiogenic edema.  Palliative medicine team consulted to assist with complex medical decision making.  Extensive review of EMR prior to presenting to bedside.  Discussed care with pulmonologist and oncologist.  Also discussed care with pharmacy to determine amount of steroids received for possible management of pneumonitis.  Patient's differential diagnosis for worsening lung function could be secondary to viral versus bacterial pneumonia, pneumonitis, atypical infection, Boop, and much less likely from lymphangitic spread of neuroendocrine tumor.   As per EMR review, patient has MOST form on file from 2023.  At that time patient elected for DNR CODE STATUS, limited medical interventions, to receive IV fluids and antibiotics if indicated, and would allow for trial of feeding tube support.  Presented to bedside  to meet with patient.  Patient sitting up in chair at bedside on high flow nasal cannula.  Able to meet with patient and his wife and her son Elijah Birk at bedside.  Introduced myself and the role of the palliative medicine team in patient's medical journey.  During conversation, reviewing medical history, pulmonologist Dr. Francine Graven able to join conversation.  Dr. Francine Graven updated patient and family regarding continued medical management.  Planning to check autoimmune panel and urinalysis to evaluate for vasculitis.  Adding medication for possible empiric fungal coverage.  Will change patient over to BiPAP for more respiratory support at night.  Inquired if discussions had occurred regarding patient's CODE STATUS.  Dr. Renette Butters patient noted this had been discussed that should patient medically deteriorate and require ventilator support, Dr. Francine Graven is supportive of this as being could obtain tissue via bronchoscopy.  Dr. Francine Graven did express concern that should patient go onto a ventilator, there is a high possibility that patient would not be able to to survive without ventilator support.  This provider agreed with concern related to patient going on to a ventilator at all knowing that previously had stated wishes for DNR.  Expressed concern that should patient end up on ventilator, family would have to make a difficult discussion regarding palliative extubation if patient unable to communicate.  Encouraged family to discuss this as would be important regarding patient stating his wishes while able.  Patient and family acknowledged this.  Also discussed from a symptom management perspective, assisting with shortness of breath management.  Patient does not feel anxiety contributing to her shortness of breath.  Patient feels that he truly can just not inhale enough oxygen as it is required and this makes him short of breath.  Discussed adding low-dose oral morphine and nebulized morphine to assist with relaxing musculature and  assisting with shortness of breath management.  Patient and family agreeing with this plan.  Noted palliative medicine team to continue to follow along with patient's medical journey.  All questions answered at that time.  Thanked patient and family for allowing me to visit with them today.  Discussed care with pulmonologist, oncologist, pharmacist, RN, and hospitalist during the day and after discussions with patient and family.  Primary Diagnoses  Present on Admission:  Pneumonia of both lungs due to infectious organism   Palliative Review of Systems: Shortness of breath   Past Medical History:  Diagnosis Date   Basal cell carcinoma of auricle of right ear    BPH (benign prostatic hyperplasia)    DJD (degenerative joint disease)    left   ED (erectile dysfunction)    Fatigue    Hearing loss, sensorineural    Hx of adenomatous colonic polyps    Hx of major depression    Hypertension    Iron deficiency anemia    Nephrolithiasis    Paroxysmal atrial fibrillation (HCC)    Seasonal allergic rhinitis    Social History   Socioeconomic History   Marital status: Married    Spouse name: Not on file   Number of children: Not on file   Years of education: Not on file   Highest education level: Not on file  Occupational History   Not on file  Tobacco Use   Smoking status: Former    Current packs/day: 0.00    Average packs/day: 1 pack/day for 1.5 years (1.5 ttl pk-yrs)    Types: Cigarettes    Start date: 01/15/1959    Quit date: 07/15/1960    Years since quitting: 62.9   Smokeless tobacco: Never  Vaping Use   Vaping status: Never Used  Substance and Sexual Activity   Alcohol use: No   Drug use: Never   Sexual activity: Yes  Other Topics Concern   Not on file  Social History Narrative   Lives with wife, Thurston Hole. No indoor pets.    Social Drivers of Corporate investment banker Strain: Not on file  Food Insecurity: No Food Insecurity (06/07/2023)   Hunger Vital Sign     Worried About Running Out of Food in the Last Year: Never true    Ran Out of Food in the Last Year: Never true  Transportation Needs: No Transportation Needs (06/07/2023)   PRAPARE - Administrator, Civil Service (Medical): No    Lack of Transportation (Non-Medical): No  Physical Activity: Not on file  Stress: Not on file  Social Connections: Moderately Integrated (06/07/2023)   Social Connection and Isolation Panel [NHANES]    Frequency of Communication with Friends and Family: Three times a week    Frequency of Social Gatherings with Friends and Family: Once a week    Attends Religious Services: Never    Database administrator or Organizations: Yes    Attends Banker Meetings: Never    Marital Status: Married   Family History  Problem Relation Age of Onset   CAD Mother    Heart disease Mother    Heart attack Father    Cancer - Prostate Brother    Alcoholism Daughter    Arrhythmia Daughter    Healthy Son    Healthy Daughter    Scheduled Meds:  apixaban  5 mg Oral BID   atorvastatin  20 mg Oral Daily   budesonide (PULMICORT) nebulizer solution  0.25 mg Nebulization BID   buPROPion  150 mg Oral q morning   busPIRone  5 mg Oral QHS   Chlorhexidine Gluconate Cloth  6 each Topical Daily   feeding supplement  237 mL Oral BID BM   flecainide  100 mg Oral BID   folic acid  1 mg Oral Daily   guaiFENesin  1,200 mg Oral BID   insulin aspart  0-20 Units Subcutaneous TID WC   insulin aspart  0-5 Units Subcutaneous QHS   insulin aspart  4 Units Subcutaneous TID WC   insulin glargine-yfgn  10 Units Subcutaneous Daily   ipratropium  0.5 mg Nebulization TID   levalbuterol  0.63 mg Nebulization TID   mirtazapine  15 mg Oral QHS   pantoprazole  40 mg Oral Daily   predniSONE  60 mg Oral Q breakfast   sulfamethoxazole-trimethoprim  2 tablet Oral Q8H   Continuous Infusions:  ceFEPime (MAXIPIME) IV Stopped (06/14/23 0306)   PRN Meds:.acetaminophen **OR**  acetaminophen, albuterol, ondansetron **OR** ondansetron (ZOFRAN) IV, traZODone No Known Allergies CBC:    Component Value Date/Time   WBC 18.2 (H) 06/14/2023 0251   HGB 12.6 (L) 06/14/2023 0251   HGB 12.7 (L) 01/31/2017 0849   HCT 38.6 (L) 06/14/2023 0251   HCT 38.4 01/31/2017 0849   PLT 369 06/14/2023 0251   PLT 324 01/31/2017 0849   MCV 88.1 06/14/2023 0251   MCV 85 01/31/2017 0849   NEUTROABS 15.0 (H) 06/14/2023 0251   LYMPHSABS 0.8 06/14/2023 0251   MONOABS 2.1 (H) 06/14/2023 0251   EOSABS 0.1 06/14/2023 0251   BASOSABS 0.0 06/14/2023 0251   Comprehensive Metabolic Panel:    Component Value Date/Time   NA 135 06/14/2023 0251   NA 141 02/14/2019 0925   K 3.7 06/14/2023 0251   CL 99 06/14/2023 0251   CO2 25 06/14/2023 0251   BUN 58 (H) 06/14/2023 0251   BUN 11 02/14/2019 0925   CREATININE 1.47 (H) 06/14/2023 0251   CREATININE 0.99 03/21/2016 1620   GLUCOSE 94 06/14/2023 0251   CALCIUM 8.8 (L) 06/14/2023 0251   AST 69 (H) 06/14/2023 0251   ALT 60 (H) 06/14/2023 0251   ALKPHOS 351 (H) 06/14/2023 0251   BILITOT 0.9 06/14/2023 0251   PROT 6.5 06/14/2023 0251   ALBUMIN 2.7 (L) 06/14/2023 0251    Physical Exam: Vital Signs: BP 124/69   Pulse 81   Temp 97.7 F (36.5 C) (Oral)   Resp (!) 35   Ht 6\' 1"  (1.854 m)   Wt 87.1 kg   SpO2 95%   BMI 25.33 kg/m  SpO2: SpO2: 95 % O2 Device: O2 Device: Heated High Flow Nasal Cannula O2 Flow Rate: O2 Flow Rate (L/min): 55 L/min Intake/output summary:  Intake/Output Summary (Last 24 hours) at 06/14/2023 1333 Last data filed at 06/14/2023 0401 Gross per 24 hour  Intake 680 ml  Output 575 ml  Net 105 ml   LBM: Last BM Date : 06/12/23 Baseline Weight: Weight: 86.7 kg Most recent weight: Weight: 87.1 kg  General: NAD, alert, sitting in bedside chair, chronically ill appearing  HENT: HFNC in place Cardiovascular: RRR Respiratory: increased work of breathing noted, on HFNC Neuro: A&Ox4, following commands easily Psych:  appropriately answers all questions          Palliative Performance Scale: 40%              Additional Data Reviewed: Recent Labs  06/13/23 0258 06/14/23 0251  WBC 14.0* 18.2*  HGB 11.7* 12.6*  PLT 313 369  NA 136 135  BUN 50* 58*  CREATININE 1.31* 1.47*    Imaging: DG CHEST PORT 1 VIEW CLINICAL DATA:  Dyspnea.  EXAM: PORTABLE CHEST 1 VIEW  COMPARISON:  June 13, 2023.  FINDINGS: Stable cardiomediastinal silhouette. Stable bilateral lung opacities are noted, left greater than right, concerning for multifocal pneumonia or possibly edema. Bony thorax is unremarkable.  IMPRESSION: Stable bilateral lung opacities as noted above.  Electronically Signed   By: Lupita Raider M.D.   On: 06/14/2023 10:49    I personally reviewed recent imaging.   Palliative Care Assessment and Plan Summary of Established Goals of Care and Medical Treatment Preferences   Patient is an 89 year old male with a past medical history of A-fib on Eliquis, BPH, and recently diagnosed stage IV metastatic neuroendocrine tumor with unknown primary who was admitted on 06/07/2023 for management of weakness and shortness of breath.  Patient had recently been started Afinitor for cancer management.  This was discontinued as patient was unable to tolerate in setting of significant fatigue and cough.  Patient was referred to Dr. Ty Hilts for [facility] therapy though has been hospitalized and unable to attend this appointment.  During hospitalization patient has had worsening acute respiratory failure with noted hypoxia.  Pulmonology following to assist with management.  Oncology following in setting of metastatic neuroendocrine tumor.  Cardiology consulted for concerns of cardiogenic edema.  Palliative medicine team consulted to assist with complex medical decision making.  # Complex medical decision making/goals of care  -Extensive discussion with patient, patient's wife and her son Elijah Birk as detailed above in  HPI.  Pulmonologist, Dr. Francine Graven, present during conversation as well.  Continuing with current aggressive medical therapies to determine if patient's respiratory status can be improved.  Allowing time for outcomes per oncology and pulmonology.   -Patient has MOST form on file from 2023 which notes DNR, limited medical interventions, received IV fluids or antibiotics, oral and trial period of tube feeds if needed.  Patient has elected for full code after discussion with pulmonology noting patient could have bronchoscopy with biopsy if intubated.  Great concern that patient would never be able to be liberated from ventilator and less in a palliative pathway. Informed patient and family of importance to discuss what patient would want should this pathway occur.   -  Code Status: Full Code   # Symptom management Patient is receiving these palliative interventions for symptom management with an intent to improve quality of life.   -Dyspnea, in setting of acute hypoxic respiratory failure possibly multifactorial   -Start morphine oral solution 2 mg every 2 hours as needed   -Start morphine for inhalation 10 mg every 3 hours as needed.  Can use oral morphine and inhaled morphine since inhaled morphine does not cause systemic effects.   -Monitor kidney function while receiving oral morphine  # Psycho-social/Spiritual Support:  - Support System: wife  # Discharge Planning:  To Be Determined  Thank you for allowing the palliative care team to participate in the care [name].  Alvester Morin, DO Palliative Care Provider PMT # [phone]  If patient remains symptomatic despite maximum doses, please call PMT at [phone] between 0700 and 1900. Outside of these hours, please call attending, as PMT does not have night coverage.  Personally spent 82 minutes in patient care including extensive chart review (labs, imaging, progress/consult notes, vital signs), medically appropraite exam,  discussed with treatment team, education to patient, family, and staff, documenting clinical information, medication review and management, coordination of care, and available advanced directive documents.

## 2023-06-14 NOTE — Progress Notes (Signed)
Rounding Note    Patient Name: Corey [name] Date of Encounter: 06/14/2023  [facility] Cardiologist: Chrystie Nose, MD   Subjective   I's and O's about even yesterday. Remains on HHFNC at 55 L/min. Repeat CXR today-  not read by radiology, but appears to have perhaps some improvement in left-sided upper airspace disease. Creatinine up to 1.47 -diuretics were held. Overall, he does not feel much better- still very short of breath, not able to sleep well.  Inpatient Medications    Scheduled Meds:  apixaban  5 mg Oral BID   atorvastatin  20 mg Oral Daily   budesonide (PULMICORT) nebulizer solution  0.25 mg Nebulization BID   buPROPion  150 mg Oral q morning   busPIRone  5 mg Oral QHS   Chlorhexidine Gluconate Cloth  6 each Topical Daily   feeding supplement  237 mL Oral BID BM   flecainide  100 mg Oral BID   folic acid  1 mg Oral Daily   guaiFENesin  1,200 mg Oral BID   insulin aspart  0-20 Units Subcutaneous TID WC   insulin aspart  0-5 Units Subcutaneous QHS   insulin aspart  4 Units Subcutaneous TID WC   insulin glargine-yfgn  10 Units Subcutaneous Daily   ipratropium  0.5 mg Nebulization TID   levalbuterol  0.63 mg Nebulization TID   mirtazapine  15 mg Oral QHS   pantoprazole  40 mg Oral Daily   predniSONE  60 mg Oral Q breakfast   sulfamethoxazole-trimethoprim  2 tablet Oral Q8H   Continuous Infusions:  ceFEPime (MAXIPIME) IV Stopped (06/14/23 0306)   PRN Meds: acetaminophen **OR** acetaminophen, albuterol, ondansetron **OR** ondansetron (ZOFRAN) IV, traZODone   Vital Signs    Vitals:   06/14/23 0600 06/14/23 0638 06/14/23 0749 06/14/23 0750  BP: (!) 114/51     Pulse: 73   79  Resp: (!) 29   (!) 32  Temp:  (!) 97.5 F (36.4 C)    TempSrc:  Oral    SpO2: 92%  93% 91%  Weight:      Height:        Intake/Output Summary (Last 24 hours) at 06/14/2023 0930 Last data filed at 06/14/2023 0401 Gross per 24 hour  Intake 680 ml  Output 900 ml   Net -220 ml      06/14/2023    5:00 AM 06/13/2023    5:00 AM 06/12/2023    1:00 PM  Last 3 Weights  Weight (lbs) 192 lb 0.3 oz 187 lb 9.8 oz 188 lb 11.4 oz  Weight (kg) 87.1 kg 85.1 kg 85.6 kg      Telemetry    Sinus rhythm  - Personally Reviewed  ECG    N/A  Physical Exam   GEN: Awake, mild to moderate respiratory distress on HHFNC   Neck: No JVD. Cardiac: RRR. No murmurs, rubs, or gallops.  Respiratory: bilateral dry crackles, worse at the bases GI: Soft, non-distended, and non-tender. MS: No lower extremity edema. No deformity. Neuro:  No focal deficits. Psych: Normal affect. Responds appropriately.  Labs    High Sensitivity Troponin:   Recent Labs  Lab 06/07/23 0909 06/07/23 1158  TROPONINIHS 16 16     Chemistry Recent Labs  Lab 06/11/23 0512 06/12/23 0535 06/13/23 0258 06/14/23 0251  NA 137 132* 136 135  K 4.1 4.0 3.8 3.7  CL 99 96* 101 99  CO2 24 25 22 25   GLUCOSE 262* 351* 128* 94  BUN  33* 44* 50* 58*  CREATININE 1.29* 1.16 1.31* 1.47*  CALCIUM 8.8* 8.7* 8.7* 8.8*  MG 2.5* 2.8* 2.8*  --   PROT 6.7 6.4* 5.8* 6.5  ALBUMIN 2.6* 2.6* 2.4* 2.7*  AST 60* 51* 60* 69*  ALT 34 33 45* 60*  ALKPHOS 411* 372* 338* 351*  BILITOT 1.1 1.0 0.7 0.9  GFRNONAA 54* >60 53* 46*  ANIONGAP 14 11 13 11     Lipids No results for input(s): "CHOL", "TRIG", "HDL", "LABVLDL", "LDLCALC", "CHOLHDL" in the last 168 hours.  Hematology Recent Labs  Lab 06/12/23 0535 06/13/23 0258 06/14/23 0251  WBC 12.5* 14.0* 18.2*  RBC 4.49 4.07* 4.38  HGB 12.8* 11.7* 12.6*  HCT 39.5 35.8* 38.6*  MCV 88.0 88.0 88.1  MCH 28.5 28.7 28.8  MCHC 32.4 32.7 32.6  RDW 13.4 13.5 13.7  PLT 358 313 369   Thyroid No results for input(s): "TSH", "FREET4" in the last 168 hours.  BNP Recent Labs  Lab 06/11/23 0516 06/12/23 0942 06/13/23 0258  BNP 446.9* 243.2* 174.5*    DDimer No results for input(s): "DDIMER" in the last 168 hours.   Radiology    DG CHEST PORT 1 VIEW Result  Date: 06/13/2023 CLINICAL DATA:  [id number] SOB (shortness of breath) [id number] EXAM: PORTABLE CHEST 1 VIEW COMPARISON:  06/12/2023 FINDINGS: Stable cardiomediastinal contours. Aortic atherosclerosis. Persistent bilateral airspace opacities, left worse than right, not appreciably changed. No pleural fluid collection. No pneumothorax. IMPRESSION: Persistent bilateral airspace opacities, left worse than right, not appreciably changed. Electronically Signed   By: Duanne Guess D.O.   On: 06/13/2023 09:26    Cardiac Studies   Echocardiogram 06/07/2023: Impressions: 1. Left ventricular ejection fraction, by estimation, is 50 to 55%. Left  ventricular ejection fraction by 2D MOD biplane is 50.5 %. The left  ventricle has low normal function. The left ventricle has no regional wall  motion abnormalities. Left  ventricular diastolic parameters are consistent with Grade I diastolic  dysfunction (impaired relaxation).   2. Right ventricular systolic function is mildly reduced. The right  ventricular size is normal.   3. The mitral valve is grossly normal. Trivial mitral valve  regurgitation.   4. The aortic valve is tricuspid. Aortic valve regurgitation is not  visualized. Aortic valve sclerosis/calcification is present, without any  evidence of aortic stenosis.   5. The inferior vena cava is normal in size with greater than 50%  respiratory variability, suggesting right atrial pressure of 3 mmHg.    Patient Profile     89 y.o. male with a history of mild non-obstructive CAD on coronary CTA in 02/2019, chronic HFpEF, paroxysmal atrial fibrillation on Eliquis, hypertension, type 2 diabetes mellitus, iron deficiency anemia, BPH, and stage IV neuroendocrine tumor who was admitted on 06/07/2023 for acute hypoxic respiratory failure secondary to community acquired pneumonia and possible CHF. He was started on IV antibiotics and initially had some improvement. However, he then developed worsening respiratory  distress and Cardiology was consulted for assistance with diuresis. He ultimately had to be transferred to the stepdown on 1/14 due to continued need for high flow O2. He was seen by PCCM and was started on steroid taper for possible pneumonitis.   Assessment & Plan    Acute Hypoxic Respiratory Failure Patient was admitted for acute hypoxic respiratory failure felt to be secondary to CAP and possible CHF. He was treated with antibiotics and diuretics but increased O2 requirement and was transferred to step down unit on 1/14. Pulmonology was consulted and differential  includes pneumonia, pneumonitis due to Everolimus, and asymmetric cardiogenic pulmonary edema. He was started on a steroid taper. CRP elevated at 4.8 on 1/14 but Sed Rate normal.  - Still requiring HHFNC - at 55% FI02 - mild to moderate respiratory distress - exam consistent with significant interstitial lung dz  Acute on Chronic Diastolic CHF BNP 621 >> 446 >> 243 >> 174. Repeat chest x-ray today shows persistent bilateral airspace opacities (left > right) but no appreciable change from prior imaging. Echo showed LVEF of 50-55% with normal wall motion and grade 1 diastolic dysfunction as well as mildly reduced RV function. He has received a couple of doses of IV Lasix (last dose 1/14). Documented urinary output of 1.15 L yesterday but only net negative 408 mL this admission. Creatinine up today and BUN has slowly been increasing the last few days. - Appears euvolemic, if not dry at this point, I's/o's are matched - hold off on additional diuretics, creatinine is trending up  Non-Obstructive CAD Noted on coronary CTA in 02/2019. - No chest pain. - No aspirin due to need for full anticoagulation. - Continue statin.  Paroxysmal Atrial Fibrillation Maintaining sinus rhythm. Rates in the 70s to 80s. - Continue home Flecainide 100mg  twice daily. - Continue Eliquis 5mg  twice daily.  Hypertension BP soft at times but mostly well  controlled.  - Continue Irbesartan 300mg  daily.  Mild Renal Insufficiency CKD Stage II Creatinine 1.11 on admission. Baseline around 0.9 to 1.1.  - Creatinine slightly up at 1.31 today after Lasix yesterday. BUN has also been increasing over the last several days 17 >> 21 >> 33 >> 44 >> 50. - Continue to monitor closely.   Otherwise, per primary team: - Community acquired pneumonia - Elevated LFTs - Hyperlipidemia - Type 2 diabetes  - GERD - Anemia - BPH - Stage IV neuroendocrine cancer - Anxiety/ depression  Overall prognosis remains guarded - I spoke with PCCM about any other possible options, but they seem limited at this point. He is still FULL CODE.  For questions or updates, please contact [facility] Please consult [email or web address] for contact info under   Chrystie Nose, MD, Milagros Loll  [place]  [facility]  Medical Director of the Advanced Lipid Disorders &  Cardiovascular Risk Reduction Clinic Diplomate of the American Board of Clinical Lipidology Attending Cardiologist  Direct Dial: [phone]  Fax: [phone]  Website:  [email or web address]  Chrystie Nose, MD  06/14/2023, 9:30 AM

## 2023-06-14 NOTE — Progress Notes (Signed)
Initial Nutrition Assessment  DOCUMENTATION CODES:   Severe malnutrition in context of chronic illness  INTERVENTION:  -Ensure Enlive BID -Magic Cup BID -MVI daily  NUTRITION DIAGNOSIS:   Severe Malnutrition related to chronic illness as evidenced by severe fat depletion, severe muscle depletion.  GOAL:   Patient will meet greater than or equal to 90% of their needs  MONITOR:   PO intake, Supplement acceptance  REASON FOR ASSESSMENT:   Malnutrition Screening Tool    ASSESSMENT:   Pt with PMH of paroxysmal atrial fibrillation, HTN, BPH, recent stage IV metastatic neuroendocrine tumor diagnosis (03/2023) . Pt admitted for hypoxic respiratory failure due to community-acquired pneumonia.  Pt with son and wife at bedside during visit. Pt originally eating 75-100% of meals in hospital. Pt reports now not eating well the past few days due to loss of appetite. He is on heart healthy/carb modified diet. Pt reports having a good appetite when at home. Pt reports drinking his Boost twice a day at home and in the hospital. Pt reports wife being the "cook of the house". He reports eating 3 meals a day with 2 Boost's in between meals. He reports having oatmeal, fruit, and coffee for breakfast, a meat with vegetables and salad for lunch, and a light dinner that is usually lunch leftovers. Pt is open to adding magic cup's to his meals and adding a daily multivitamin while in the hospital. Will continue Ensure order.  Pt wife reports him losing weight over the past few months. She reports his usual weight is 197 lb and his current wt being 185 lb. Per chart he last weighed 197 lb in 03/2023 and currently weighs 191.65 lb.  Pt reports being able to get around the house fine but gets out of breath.  Meds: Ensure Enlive BID, Folvite 1 mg daily, Novolog TID with meals, Semglee 10 units daily  Labs: Glucose 75-457 (past 2 days), calcium low  NUTRITION - FOCUSED PHYSICAL EXAM:  Flowsheet Row Most  Recent Value  Orbital Region Moderate depletion  Upper Arm Region Severe depletion  Thoracic and Lumbar Region Severe depletion  Buccal Region Unable to assess  [facility] covering area]  Temple Region Mild depletion  Clavicle Bone Region Moderate depletion  Clavicle and Acromion Bone Region Severe depletion  Scapular Bone Region Severe depletion  Dorsal Hand Moderate depletion  Patellar Region Severe depletion  Anterior Thigh Region Moderate depletion  Posterior Calf Region Severe depletion  Edema (RD Assessment) Mild  [lower extremities]  Hair Reviewed  Eyes Reviewed  Mouth Reviewed  Skin Reviewed  Nails Reviewed       Diet Order:   Diet Order             Diet heart healthy/carb modified Room service appropriate? Yes; Fluid consistency: Thin  Diet effective now                   EDUCATION NEEDS:   Education needs have been addressed  Skin:  Skin Assessment: Reviewed RN Assessment  Last BM:  1/14  Height:   Ht Readings from Last 1 Encounters:  06/12/23 6\' 1"  (1.854 m)    Weight:   Wt Readings from Last 1 Encounters:  06/14/23 87.1 kg    Ideal Body Weight:   190 lb  BMI:  Body mass index is 25.33 kg/m.  Estimated Nutritional Needs:   Kcal:  2250-2450  Protein:  110-120 g  Fluid:  2 L    Maceo Pro, MS Dietetic Intern

## 2023-06-14 NOTE — Progress Notes (Signed)
Progress Note   Patient: Corey [name]:[id number] DOB: 08-04-1934 DOA: 06/07/2023     6 DOS: the patient was seen and examined on 06/14/2023   Brief hospital course: 89yo with h/o afib on Eliquis, BPH, and recently diagnosed (03/2023) stage IV metastatic neuroendocrine tumor with unknown primary who presented on 1/9 with weakness and SOB.  He was recently started on Afinitor but was unable to tolerate it due to significant fatigue and cough.  He was awaiting consultation with Dr. Amil Amen for Lutathera therapy but missed his appointment because of this hospitalization.  He saw his oncologist on 1/6 and CXR showed pneumonia.  He was then seen by his cardiologist on 1/7 and there was concern for heart failure so he was started on Lasix.  He had a good response to Lasix and his peripheral edema resolved however his cough and dyspnea continued.  Given this he presented to the ED and further workup showed evidence of progressive pulmonary consolidation.  He was started on azithromycin and Rocephin and repeat CXR showed "Interval worsening multifocal left lung pneumonia". Chest CT showed no evidence of PE but did show possible pulmonary edema.  He was given IV Solu-Medrol and a dose of IV Lasix and cardiology has been consulted.  Pulmonary added IV steroids 125 mg every 12 for 5 more doses.  Respiratory status worsened on 1/14 and he was transferred to the stepdown unit and placed on heated high flow nasal cannula 60 L.  Antibiotics escalated to cefepime and pulmonary added Bactrim.  Inflammatory markers being checked and cardiology give another dose of diuresis.    Assessment and Plan:  Acute Respiratory Failure with Hypoxia  Presented with acute respiratory failure with hypoxia and had a reported pulse oximetry of 85% on room air at home Ddx includes PNA, pneumonitis (had 1 dose of everolimus recently), cardiogenic edema Most likely etiology of his hypoxia due to community-acquired pneumonia; PE  was considered in the setting of his malignancy but is unlikely given that the patient is already anticoagulated Eliquis  He acutely decompensated on 1/14 and was transferred to SDU, is currently on 60L HFNC O2, FiO2 80% Repeat CXR on 1/15 showed persistent B airspace opacities, L > R, unchanged Pulmonary consulted Given Solumedrol -> prednisone and Lasix (now off) Also added Xopenex/Atrovent and also on Budesonide 0.25 mg twice daily Supplemental oxygen via nasal cannula wean O2 as tolerated Will need an ambulatory home O2 screen and repeat chest x-ray prior to discharge   Community-Acquired Pneumonia Has had progressive cough, hypoxia, multilobar pneumonia seen on chest x-ray and has been progressive when compared to x-ray from 06/04/2023 No fever, leukocytosis, or other evidence of sepsis Completed Azithromycin/Ceftriaxone, s/p 1 dose of Vanc, started on Cefepime and Bactrim on 1/14 Started on Solumedrol 120 mg IV q12 x 3 days with plan for extended steroid taper with prednisone (60 mg starting 1/16) Incentive spirometer, and flutter valve and Guaifenesin Repeat CXR in AM   Acute on Chronic Diastolic CHF He had responded well to several days of p.o. Lasix, currently has no evidence of fluid overload. Holding Lasix at this time Cardiology is following Echocardiogram 1/9 with EF of 50-55% with grade 1 diastolic dysfunction    Bacteremia, factitious Patient grew Staph hominis in 1 culture but other culture negative and repeat cultures negative x 5 days Very likely contaminant   Stage IV Neuroendocrine Tumor Dr. Mosetta Putt consulted on 1/13 and recommended continued antibiotics for now Started on Everolimus (Afinitor) on 12/18, stopped on 1/6  due to poor tolerance He is now planning to start bimonthly infusions with Lutathera Will need outpatient follow-up with Dr. Mosetta Putt and Dr. Amil Amen as an outpatient once stable for dc Consider palliative care consultation   Paroxysmal Atrial  Fibrillation Continue Flecainide  Continue Eliquis   Stage 3a CKD Baseline creatinine appears to be 1.1-1.3 Appears to be stable at this time Attempt to avoid nephrotoxic medications Recheck BMP in AM    Mildly abnormal LFTs Not overly concerning Continue to follow   Hypertension Hold valsartan/HCTZ If BP is uptrending, can resume hole amlodipine   Diabetes Mellitus Type 2 with Hyperglycemia  HbA1c was 7.9, at goal of <8 based on age Not on home meds Glucose significantly elevated from steroids Added Semglee 10 units + 4 units qAC + resistant-scale SSI with significant improvement   Depression and Anxiety Continue buproprion, buspirone, and mirtazepine   Normocytic Anemia Checked Anemia Panel showed an iron level of 30, UIBC 258, TIBC 288, saturation history of 10%, ferritin level of 84, folate of 4.9 and vitamin B12 level of 888 Started Folic Acid 1 mg po Daily  Appears to be relatively stable Continue to monitor   HLD Continue atorvastatin for now Monitor LFTs, as this may need to be help if liver function worsens   GERD/GI Prophylaxis Continue PPI          Consultants: Oncology Pulmonology Cardiology Palliative care [facility] team DM coordinator   Procedures: Echocardiogram 1/9   Antibiotics: Azithromycin 1/9-13 Ceftriaxone 1/9-14 Cefepime 1/14- Bactrim 1/14- Vancomycin x 1   30 Day Unplanned Readmission Risk Score    Flowsheet Row ED to Hosp-Admission (Current) from 06/07/2023 in [facility]-ICU/STEPDOWN  30 Day Unplanned Readmission Risk Score (%) 22.22 Filed at 06/14/2023 0400       This score is the patient's risk of an unplanned readmission within 30 days of being discharged (0 -100%). The score is based on dignosis, age, lab data, medications, orders, and past utilization.   Low:  0-14.9   Medium: 15-21.9   High: 22-29.9   Extreme: 30 and above           Subjective: He is not feeling well.  +SOB, restless overnight.  He  is open to palliative care consultation.   Objective: Vitals:   06/14/23 0600 06/14/23 0638  BP: (!) 114/51   Pulse: 73   Resp: (!) 29   Temp:  (!) 97.5 F (36.4 C)  SpO2: 92%     Intake/Output Summary (Last 24 hours) at 06/14/2023 0721 Last data filed at 06/14/2023 0401 Gross per 24 hour  Intake 920 ml  Output 900 ml  Net 20 ml   Filed Weights   06/12/23 1300 06/13/23 0500 06/14/23 0500  Weight: 85.6 kg 85.1 kg 87.1 kg    Exam:  General:  Appears ill, mild respiratory distress despite on HFNC O2 Eyes:  EOMI, normal lids, iris ENT:  hard of hearing, grossly normal lips & tongue, mmm Neck:  no LAD, masses or thyromegaly Cardiovascular:  RRR, no m/r/g. No LE edema.  Respiratory:   Scattered coarse rhonchi.   Mildly to moderately increased respiratory effort on HFNC O2 Abdomen:  soft, NT, ND Skin:  no rash or induration seen on limited exam Musculoskeletal:  grossly normal tone BUE/BLE, good ROM, no bony abnormality Psychiatric:  grossly normal mood and affect, speech fluent and appropriate, AOx3 Neurologic:  CN 2-12 grossly intact, moves all extremities in coordinated fashion  Data Reviewed: I have reviewed the patient's  lab results since admission.  Pertinent labs for today include:   BUN 58/Creatinine 1.47/GFR 46 AP 351 Albumin 2.7 AST 69/ALT 60 WBC 18.2 Hgb 12.6 A1c 7.9    Family Communication: None present  Disposition: Status is: Inpatient Remains inpatient appropriate because: critically ill  Total critical care time: 50 minutes Critical care time was exclusive of separately billable procedures and treating other patients. Critical care was necessary to treat or prevent imminent or life-threatening deterioration. Critical care was time spent personally by me on the following activities: development of treatment plan with patient and/or surrogate as well as nursing, discussions with consultants, evaluation of patient's response to treatment, examination  of patient, obtaining history from patient or surrogate, ordering and performing treatments and interventions, ordering and review of laboratory studies, ordering and review of radiographic studies, pulse oximetry and re-evaluation of patient's condition.   Unresulted Labs (From admission, onward)     Start     Ordered   06/12/23 1111  Fungitell Beta-D-Glucan  Once,   R       Question:  Specimen collection method  Answer:  Lab=Lab collect   06/12/23 1110   06/12/23 0923  Hypersensitivity Pneumonitis  Once,   R       Question:  Specimen collection method  Answer:  Lab=Lab collect   06/12/23 8295             Author: Jonah Blue, MD 06/14/2023 7:21 AM  For on call review [email or web address].

## 2023-06-14 NOTE — Progress Notes (Signed)
   06/14/23 1453  Oxygen Therapy/Pulse Ox  O2 Device (S)  HHFNC  O2 Therapy (S)  Oxygen humidified  Heater temperature (S)  87.8 F (31 C)  O2 Flow Rate (L/min) (S)  60 L/min (Increased to 60 L)  FiO2 (%) (S)  85 % (Increased to 85%.)  SpO2 (S)  96 %  Safety Instructions (S)  Yes (Comment)

## 2023-06-14 NOTE — Progress Notes (Signed)
NAME:  Corey [name], MRN:  [id number], DOB:  [birth year], LOS: 6 ADMISSION DATE:  06/07/2023, CONSULTATION DATE: 06/10/2023 REFERRING MD: Dr. Marland Mcalpine, CHIEF COMPLAINT: Acute hypoxemic respiratory failure  History of Present Illness:  89 year old man with a history of former tobacco, newly diagnosed metastatic neuroendocrine tumor followed by Dr. Mosetta Putt.  Also with atrial fibrillation (now NSR), hypertension, CAD with a reassuring cardiac stress test 2023.  EF 51%.  For his neuroendocrine tumor he has been treated initially with Lutathera (stopped for transaminitis) and then everolimus for 3 weeks (stopped 1/6).  At that visit 1/6 he was found to be hypoxemic and had patchy bilateral pulmonary infiltrates on chest x-ray.  Subsequently seen by Dr. Rennis Golden with cardiology and started on furosemide for possible component of pulmonary edema.  Now admitted 06/07/2023 with progressive dyspnea and cough.  He was started on azithromycin and ceftriaxone for possible community-acquired pneumonia.  Chest x-ray at the time of admission shows progression of left-sided hazy pulmonary infiltrates.  Pertinent  Medical History   Past Medical History:  Diagnosis Date   Basal cell carcinoma of auricle of right ear    BPH (benign prostatic hyperplasia)    DJD (degenerative joint disease)    left   ED (erectile dysfunction)    Fatigue    Hearing loss, sensorineural    Hx of adenomatous colonic polyps    Hx of major depression    Hypertension    Iron deficiency anemia    Nephrolithiasis    Paroxysmal atrial fibrillation (HCC)    Seasonal allergic rhinitis   Metastatic neuroendocrine tumor Atrial fibrillation   Significant Hospital Events: Including procedures, antibiotic start and stop dates in addition to other pertinent events   1/14 increase in O2 requirements overnight, transferred to step down unit on [facility] 1/15 on HHFNC 60L 80% FiO2 1/16 on HHFNC 55L 90% FiO2  Interim History / Subjective:    Feeling more short of breath today, O2 Saturations maintaining. Palliative care meeting with patient and family  Objective   Blood pressure (!) 114/51, pulse 79, temperature (!) 97.5 F (36.4 C), temperature source Oral, resp. rate (!) 32, height 6\' 1"  (1.854 m), weight 87.1 kg, SpO2 91%.    FiO2 (%):  [75 %-93 %] 93 %   Intake/Output Summary (Last 24 hours) at 06/14/2023 0950 Last data filed at 06/14/2023 0401 Gross per 24 hour  Intake 680 ml  Output 900 ml  Net -220 ml   Filed Weights   06/12/23 1300 06/13/23 0500 06/14/23 0500  Weight: 85.6 kg 85.1 kg 87.1 kg    Examination: General: Elderly man laying in bed in mild distress on HHFNC HENT: Oropharynx clear, strong voice, no secretions, no stridor Lungs: bronchial breath sounds, scattered rales, no wheezing Cardiovascular: Regular, distant, no murmur Abdomen: Nondistended with positive bowel sounds Extremities: No peripheral edema Neuro: Awake, alert, interacting appropriately, follows commands GU: Deferred  Resolved Hospital Problem list     Assessment & Plan:  Acute hypoxemic respiratory failure with pulmonary infiltrates.  Differential diagnosis includes infectious etiology (bacterial, fungal vs atypical), pneumonitis reaction to recent everolimus, pulmonary lymphangitic carcinomatosis from his neuroendoocrine disease. -Continue cefepime and bactrim - LDH elevated, started on empiric bactrim for pneumocystis coverage 1/14 - MRSA screen negative - BNP trending down with diuresis - Completed 3 days of solumedrol 125mg  BID. - Start 60mg  prednisone daily today.  - Check autoimmune panel and urinalysis to evaluate for vasculitis -  Not really in a position given his oxygen needs to  consider bronchoscopy for culture, cell count - add mycafungin for empiric fungal coverage -Follow serial chest x-ray -follow up fungitell - continue HHFNC and ordered bipap for at night - palliative adding morphine PRN  Blood culture  Staph hominis 1 of 4 -Unclear significance, suspect contaminant -Currently covered with ceftriaxone  Hypertension Paroxysmal atrial fibrillation HFpEF -cardiology following   Metastatic neuroendocrine tumor -Appreciate Dr. Latanya Maudlin assistance  Hyponatremia Hypokalemia    Labs   CBC: Recent Labs  Lab 06/10/23 0610 06/11/23 0512 06/12/23 0535 06/13/23 0258 06/14/23 0251  WBC 7.9 5.4 12.5* 14.0* 18.2*  NEUTROABS 5.5 4.8 11.4* 12.2* 15.0*  HGB 12.3* 13.2 12.8* 11.7* 12.6*  HCT 36.1* 40.6 39.5 35.8* 38.6*  MCV 88.7 87.9 88.0 88.0 88.1  PLT 219 286 358 313 369    Basic Metabolic Panel: Recent Labs  Lab 06/09/23 0841 06/10/23 0610 06/11/23 0512 06/12/23 0535 06/13/23 0258 06/14/23 0251  NA 134* 136 137 132* 136 135  K 3.8 4.2 4.1 4.0 3.8 3.7  CL 99 97* 99 96* 101 99  CO2 24 29 24 25 22 25   GLUCOSE 193* 124* 262* 351* 128* 94  BUN 17 21 33* 44* 50* 58*  CREATININE 0.94 1.01 1.29* 1.16 1.31* 1.47*  CALCIUM 8.4* 8.5* 8.8* 8.7* 8.7* 8.8*  MG 2.2 2.3 2.5* 2.8* 2.8*  --   PHOS 2.8 3.3 5.0* 4.0 3.9  --    GFR: Estimated Creatinine Clearance: 40.8 mL/min (A) (by C-G formula based on SCr of 1.47 mg/dL (H)). Recent Labs  Lab 06/11/23 0512 06/12/23 0535 06/13/23 0258 06/14/23 0251  WBC 5.4 12.5* 14.0* 18.2*    Liver Function Tests: Recent Labs  Lab 06/10/23 0610 06/11/23 0512 06/12/23 0535 06/13/23 0258 06/14/23 0251  AST 51* 60* 51* 60* 69*  ALT 30 34 33 45* 60*  ALKPHOS 378* 411* 372* 338* 351*  BILITOT 1.1 1.1 1.0 0.7 0.9  PROT 6.1* 6.7 6.4* 5.8* 6.5  ALBUMIN 2.5* 2.6* 2.6* 2.4* 2.7*   No results for input(s): "LIPASE", "AMYLASE" in the last 168 hours.  No results for input(s): "AMMONIA" in the last 168 hours.  ABG    Component Value Date/Time   PHART 7.43 06/12/2023 1300   PCO2ART 38 06/12/2023 1300   PO2ART 57 (L) 06/12/2023 1300   HCO3 25.3 06/12/2023 1300   O2SAT 90.3 06/12/2023 1300     Coagulation Profile: No results for input(s):  "INR", "PROTIME" in the last 168 hours.  Cardiac Enzymes: No results for input(s): "CKTOTAL", "CKMB", "CKMBINDEX", "TROPONINI" in the last 168 hours.  HbA1C: Hgb A1c MFr Bld  Date/Time Value Ref Range Status  06/12/2023 01:03 PM 7.9 (H) 4.8 - 5.6 % Final    Comment:    (NOTE) Pre diabetes:          5.7%-6.4%  Diabetes:              >6.4%  Glycemic control for   <7.0% adults with diabetes     CBG: Recent Labs  Lab 06/13/23 0726 06/13/23 1144 06/13/23 1608 06/13/23 2134 06/14/23 0728  GLUCAP 200* 323* 247* 92 75    Critical care time: NA     Melody Comas, MD [facility] Office: [phone]   See Amion for personal pager PCCM on call pager [phone] until 7pm. Please call Elink 7p-7a. [phone]

## 2023-06-14 NOTE — Progress Notes (Signed)
   06/14/23 2349  BiPAP/CPAP/SIPAP  $ Non-Invasive Ventilator  Non-Invasive Vent Initial  $ Face Mask Large  Yes  BiPAP/CPAP/SIPAP Pt Type Adult  BiPAP/CPAP/SIPAP V60  Mask Type Full face mask  Mask Size Large  Set Rate 8 breaths/min  Respiratory Rate 28 breaths/min  IPAP 12 cmH20  EPAP 6 cmH2O  FiO2 (%) 60 %  Flow Rate 0 lpm  Minute Ventilation 16.2  Leak 25  Peak Inspiratory Pressure (PIP) 13  Tidal Volume (Vt) 623  Patient Home Equipment No  Auto Titrate No  Press High Alarm 30 cmH2O  Press Low Alarm 5 cmH2O

## 2023-06-15 ENCOUNTER — Inpatient Hospital Stay (HOSPITAL_COMMUNITY): Payer: Medicare Other

## 2023-06-15 DIAGNOSIS — C799 Secondary malignant neoplasm of unspecified site: Secondary | ICD-10-CM

## 2023-06-15 DIAGNOSIS — Z7189 Other specified counseling: Secondary | ICD-10-CM | POA: Diagnosis not present

## 2023-06-15 DIAGNOSIS — B49 Unspecified mycosis: Secondary | ICD-10-CM | POA: Clinically undetermined

## 2023-06-15 DIAGNOSIS — J9601 Acute respiratory failure with hypoxia: Secondary | ICD-10-CM | POA: Diagnosis not present

## 2023-06-15 DIAGNOSIS — E43 Unspecified severe protein-calorie malnutrition: Secondary | ICD-10-CM

## 2023-06-15 DIAGNOSIS — J168 Pneumonia due to other specified infectious organisms: Secondary | ICD-10-CM

## 2023-06-15 DIAGNOSIS — J189 Pneumonia, unspecified organism: Secondary | ICD-10-CM | POA: Diagnosis not present

## 2023-06-15 DIAGNOSIS — J810 Acute pulmonary edema: Secondary | ICD-10-CM | POA: Diagnosis not present

## 2023-06-15 DIAGNOSIS — D3A8 Other benign neuroendocrine tumors: Secondary | ICD-10-CM | POA: Diagnosis not present

## 2023-06-15 DIAGNOSIS — Z515 Encounter for palliative care: Secondary | ICD-10-CM | POA: Diagnosis not present

## 2023-06-15 DIAGNOSIS — R4589 Other symptoms and signs involving emotional state: Secondary | ICD-10-CM

## 2023-06-15 LAB — CBC WITH DIFFERENTIAL/PLATELET
Abs Immature Granulocytes: 0.15 10*3/uL — ABNORMAL HIGH (ref 0.00–0.07)
Basophils Absolute: 0 10*3/uL (ref 0.0–0.1)
Basophils Relative: 0 %
Eosinophils Absolute: 0 10*3/uL (ref 0.0–0.5)
Eosinophils Relative: 0 %
HCT: 38.2 % — ABNORMAL LOW (ref 39.0–52.0)
Hemoglobin: 12.1 g/dL — ABNORMAL LOW (ref 13.0–17.0)
Immature Granulocytes: 1 %
Lymphocytes Relative: 3 %
Lymphs Abs: 0.6 10*3/uL — ABNORMAL LOW (ref 0.7–4.0)
MCH: 28.3 pg (ref 26.0–34.0)
MCHC: 31.7 g/dL (ref 30.0–36.0)
MCV: 89.3 fL (ref 80.0–100.0)
Monocytes Absolute: 1.8 10*3/uL — ABNORMAL HIGH (ref 0.1–1.0)
Monocytes Relative: 10 %
Neutro Abs: 14.4 10*3/uL — ABNORMAL HIGH (ref 1.7–7.7)
Neutrophils Relative %: 86 %
Platelets: 328 10*3/uL (ref 150–400)
RBC: 4.28 MIL/uL (ref 4.22–5.81)
RDW: 14.1 % (ref 11.5–15.5)
WBC: 16.9 10*3/uL — ABNORMAL HIGH (ref 4.0–10.5)
nRBC: 0 % (ref 0.0–0.2)

## 2023-06-15 LAB — COMPREHENSIVE METABOLIC PANEL
ALT: 100 U/L — ABNORMAL HIGH (ref 0–44)
AST: 105 U/L — ABNORMAL HIGH (ref 15–41)
Albumin: 2.5 g/dL — ABNORMAL LOW (ref 3.5–5.0)
Alkaline Phosphatase: 409 U/L — ABNORMAL HIGH (ref 38–126)
Anion gap: 11 (ref 5–15)
BUN: 52 mg/dL — ABNORMAL HIGH (ref 8–23)
CO2: 24 mmol/L (ref 22–32)
Calcium: 8.9 mg/dL (ref 8.9–10.3)
Chloride: 101 mmol/L (ref 98–111)
Creatinine, Ser: 1.36 mg/dL — ABNORMAL HIGH (ref 0.61–1.24)
GFR, Estimated: 51 mL/min — ABNORMAL LOW (ref >60)
Glucose, Bld: 115 mg/dL — ABNORMAL HIGH (ref 70–99)
Potassium: 4.3 mmol/L (ref 3.5–5.1)
Sodium: 136 mmol/L (ref 135–145)
Total Bilirubin: 1.1 mg/dL (ref 0.0–1.2)
Total Protein: 5.9 g/dL — ABNORMAL LOW (ref 6.5–8.1)

## 2023-06-15 LAB — GLUCOSE, CAPILLARY
Glucose-Capillary: 82 mg/dL (ref 70–99)
Glucose-Capillary: 111 mg/dL — ABNORMAL HIGH (ref 70–99)
Glucose-Capillary: 187 mg/dL — ABNORMAL HIGH (ref 70–99)
Glucose-Capillary: 225 mg/dL — ABNORMAL HIGH (ref 70–99)
Glucose-Capillary: 238 mg/dL — ABNORMAL HIGH (ref 70–99)

## 2023-06-15 LAB — CYCLIC CITRUL PEPTIDE ANTIBODY, IGG/IGA: CCP Antibodies IgG/IgA: 2 U (ref 0–19)

## 2023-06-15 LAB — ANCA PROFILE
Anti-MPO Antibodies: <0.2 U (ref 0.0–0.9)
Anti-PR3 Antibodies: <0.2 U (ref 0.0–0.9)
Atypical P-ANCA titer: <1:20 {titer}
C-ANCA: <1:20 {titer}
P-ANCA: <1:20 {titer}

## 2023-06-15 LAB — ANTI-SCLERODERMA ANTIBODY: Scleroderma (Scl-70) (ENA) Antibody, IgG: <0.2 AI (ref 0.0–0.9)

## 2023-06-15 LAB — C3 COMPLEMENT: C3 Complement: 143 mg/dL (ref 82–167)

## 2023-06-15 LAB — C4 COMPLEMENT: Complement C4, Body Fluid: 23 mg/dL (ref 12–38)

## 2023-06-15 LAB — ALDOLASE: Aldolase: 14.4 U/L — ABNORMAL HIGH (ref 3.3–10.3)

## 2023-06-15 LAB — RHEUMATOID FACTOR: Rheumatoid fact SerPl-aCnc: 12.8 [IU]/mL (ref <14.0)

## 2023-06-15 MED ORDER — POLYETHYLENE GLYCOL 3350 17 G PO PACK: 17.0000 g | PACK | Freq: Every day | ORAL | Status: DC | PRN

## 2023-06-15 MED ORDER — ORAL CARE MOUTH RINSE: 15.0000 mL | OROMUCOSAL | Status: DC | PRN

## 2023-06-15 MED ORDER — BISACODYL 5 MG PO TBEC: 5.0000 mg | DELAYED_RELEASE_TABLET | Freq: Every day | ORAL | Status: DC | PRN

## 2023-06-15 MED ORDER — MORPHINE SULFATE (CONCENTRATE) 10 MG /0.5 ML PO SOLN
5.0000 mg | ORAL | Status: DC | PRN
  Administered 2023-06-15 – 2023-06-16 (×5): 5 mg via ORAL
  Filled 2023-06-15 (×5): qty 0.5

## 2023-06-15 MED ORDER — MORPHINE SULFATE (PF) 2 MG/ML IV SOLN
1.0000 mg | Freq: Once | INTRAVENOUS | Status: AC
Start: 2023-06-15 — End: 2023-06-15
  Administered 2023-06-15: 2 mg via INTRAVENOUS
  Filled 2023-06-15: qty 1

## 2023-06-15 MED ORDER — MORPHINE SULFATE (PF) 2 MG/ML IV SOLN
2.0000 mg | INTRAVENOUS | Status: DC | PRN
  Administered 2023-06-17 – 2023-06-18 (×6): 2 mg via INTRAVENOUS
  Filled 2023-06-15 (×6): qty 1

## 2023-06-15 MED ORDER — DOCUSATE SODIUM 100 MG PO CAPS
100.0000 mg | ORAL_CAPSULE | Freq: Two times a day (BID) | ORAL | Status: DC
  Administered 2023-06-15 – 2023-06-17 (×4): 100 mg via ORAL
  Filled 2023-06-15 (×5): qty 1

## 2023-06-15 NOTE — Plan of Care (Signed)
   Problem: Education: Goal: Knowledge of General Education information will improve Description: Including pain rating scale, medication(s)/side effects and non-pharmacologic comfort measures 06/15/2023 0732 by Hoover Brunette, RN Outcome: Progressing  Problem: Health Behavior/Discharge Planning: Goal: Ability to manage health-related needs will improve 06/15/2023 0732 by Hoover Brunette, RN Outcome: Progressing   Problem: Clinical Measurements: Goal: Diagnostic test results will improve 06/15/2023 0732 by Hoover Brunette, RN Outcome: Progressing

## 2023-06-15 NOTE — Progress Notes (Signed)
  Daily Progress Note   Patient Name: Corey [name]       Date: 06/15/2023 DOB: [birth year]  Age: 89 y.o. MRN#: [id number] Attending Physician: Jonah Blue, MD Primary Care Physician: Emilio Aspen, MD Admit Date: 06/07/2023 Length of Stay: 7 days  Will place full progress note when able.   Discussed care with PCCM provider, Dr. Francine Graven, today. Patient's lab work should concerns for fungal infection which he is already started on medical management for.  With PCCM permission, discussed code status with patient. Patient laying in bed on HFNC without family at bedside. Reviewed concerns regarding intubation/code status. Patient agreed with these concerns and stated he would never want to go on a ventilator. His wife has been through enough and he doesn't want family to have to decide to perform a palliative extubation. He has started DNR in his 2023 MOST form. He again agrees with changing code status to DNR/DNI. Noted would change in EMR and updated PCCM.   Patient did not want me to call his wife at this time to update her about conversation as he didn't want her to get upset over the phone. Instead provider will attempt to visit later today when she is present at bedside to update.    Alvester Morin, DO Palliative Care Provider PMT # [phone]

## 2023-06-15 NOTE — Progress Notes (Signed)
RT Note: Pt. was unable to tolerate BiPAP V60 H/S on 1/16-17/2025, used HHFNC, RT to follow.

## 2023-06-15 NOTE — Plan of Care (Signed)
  Problem: Education: Goal: Knowledge of General Education information will improve Description: Including pain rating scale, medication(s)/side effects and non-pharmacologic comfort measures Outcome: Progressing   Problem: Health Behavior/Discharge Planning: Goal: Ability to manage health-related needs will improve Outcome: Progressing   Problem: Clinical Measurements: Goal: Ability to maintain clinical measurements within normal limits will improve Outcome: Progressing Goal: Will remain free from infection Outcome: Progressing Goal: Respiratory complications will improve Outcome: Not Progressing   Problem: Activity: Goal: Risk for activity intolerance will decrease Outcome: Not Progressing   Problem: Nutrition: Goal: Adequate nutrition will be maintained Outcome: Not Progressing   Problem: Coping: Goal: Level of anxiety will decrease Outcome: Progressing   Problem: Safety: Goal: Ability to remain free from injury will improve Outcome: Progressing

## 2023-06-15 NOTE — Plan of Care (Signed)
  Problem: Education: Goal: Knowledge of General Education information will improve Description: Including pain rating scale, medication(s)/side effects and non-pharmacologic comfort measures 06/15/2023 0732 by Hoover Brunette, RN Outcome: Progressing

## 2023-06-15 NOTE — Progress Notes (Signed)
eLink Physician-Brief Progress Note Patient Name: Corey [name] DOB: December 03, 1934 MRN: [id number]   Date of Service  06/15/2023  HPI/Events of Note  pt having difficulty breathing...appears air hungry, struggling & c/o he can't breathe.   On BiPAP Started on oral morphine by palliative care today but unable to take on BiPAP  eICU Interventions  IV morphine 1 to 2 mg x 1 dose  High risk intubation, palliative conversation ongoing     Intervention Category Minor Interventions: Agitation / anxiety - evaluation and management  Damariz Paganelli V. Bertram Haddix 06/15/2023, 12:29 AM

## 2023-06-15 NOTE — Progress Notes (Signed)
Progress Note   Patient: Corey [name] WUJ:[id number] DOB: 19-Dec-1934 DOA: 06/07/2023     7 DOS: the patient was seen and examined on 06/15/2023   Brief hospital course: 89yo with h/o afib on Eliquis, BPH, and recently diagnosed (03/2023) stage IV metastatic neuroendocrine tumor with unknown primary who presented on 1/9 with weakness and SOB.  He was recently started on Afinitor but was unable to tolerate it due to significant fatigue and cough.  He was awaiting consultation with Dr. Amil Amen for Lutathera therapy but missed his appointment because of this hospitalization.  He saw his oncologist on 1/6 and CXR showed pneumonia.  He was then seen by his cardiologist on 1/7 and there was concern for heart failure so he was started on Lasix.  He had a good response to Lasix and his peripheral edema resolved however his cough and dyspnea continued.  Given this he presented to the ED and further workup showed evidence of progressive pulmonary consolidation.  He was started on azithromycin and Rocephin and repeat CXR showed "Interval worsening multifocal left lung pneumonia". Chest CT showed no evidence of PE but did show possible pulmonary edema.  He was given IV Solu-Medrol and a dose of IV Lasix and cardiology has been consulted.  Pulmonary added IV steroids 125 mg every 12 for 5 more doses.  Respiratory status worsened on 1/14 and he was transferred to the stepdown unit and placed on heated high flow nasal cannula 60 L.  Antibiotics escalated to cefepime and pulmonary added Bactrim.  Inflammatory markers being checked and cardiology give another dose of diuresis.    Assessment and Plan:  Acute Respiratory Failure with Hypoxia  Presented with acute respiratory failure with hypoxia and had a reported pulse oximetry of 85% on room air at home Ddx includes PNA, pneumonitis (had 1 dose of everolimus recently), cardiogenic edema Most likely etiology of his hypoxia due to community-acquired pneumonia; PE  was considered in the setting of his malignancy but is unlikely given that the patient is already anticoagulated Eliquis  He acutely decompensated on 1/14 and was transferred to SDU, is currently on 60L HFNC O2, FiO2 80% Repeat CXR on 1/15 showed persistent B airspace opacities, L > R, unchanged Pulmonary consulted Given Solumedrol -> prednisone and Lasix (now off) Also added Xopenex/Atrovent and also on Budesonide 0.25 mg twice daily HFNC O2 with nocturnal BIPAP added on 1/16, still having ongoing air hunger, did not tolerate BIPAP He remains SOB and having difficulty with eating, speaking, and breathing together Morphine added by palliative care yesterday for air hunger   Fungal Pneumonia Has had progressive cough, hypoxia, multilobar pneumonia seen on chest x-ray and has been progressive when compared to x-ray from 06/04/2023 No fever, leukocytosis, or other evidence of sepsis Completed Azithromycin/Ceftriaxone, s/p 1 dose of Vanc, started on Cefepime and Bactrim on 1/14 Started on Solumedrol 120 mg IV q12 x 3 days with plan for extended steroid taper with prednisone (60 mg starting 1/16) Incentive spirometer, and flutter valve and Guaifenesin Today's CXR with no interval change, persistent L > R airspace opacities Fungitell is positive, started on Micafungin Repeat CXR in AM   Acute on Chronic Diastolic CHF He had responded well to several days of p.o. Lasix, currently has no evidence of fluid overload. Holding Lasix at this time Cardiology is following Echocardiogram 1/9 with EF of 50-55% with grade 1 diastolic dysfunction    Bacteremia, factitious Patient grew Staph hominis in 1 culture but other culture negative and repeat cultures  negative x 5 days Very likely contaminant   Stage IV Neuroendocrine Tumor Dr. Mosetta Putt consulted on 1/13 and recommended continued antibiotics for now Started on Everolimus (Afinitor) on 12/18, stopped on 1/6 due to poor tolerance He is planning to start  bimonthly infusions with Lutathera once improved Will need outpatient follow-up with Dr. Mosetta Putt and Dr. Amil Amen as an outpatient once stable for dc Palliative care consulting   Paroxysmal Atrial Fibrillation Continue Flecainide  Continue Eliquis   Stage 3a CKD Baseline creatinine appears to be 1.1-1.3 Appears to be stable at this time Attempt to avoid nephrotoxic medications Recheck BMP in AM    Abnormal LFTs Worsening liver function RUQ [place] with extensive hepatic metastatic disease but no acute findings   Hypertension Hold valsartan/HCTZ If BP is uptrending, can resume hole amlodipine   Diabetes Mellitus Type 2 with Hyperglycemia  HbA1c was 7.9, at goal of <8 based on age Not on home meds Glucose significantly elevated from steroids Added Semglee 10 units + 4 units qAC + resistant-scale SSI with significant improvement   Depression and Anxiety Continue buproprion, buspirone, and mirtazepine   Normocytic Anemia Checked Anemia Panel showed an iron level of 30, UIBC 258, TIBC 288, saturation history of 10%, ferritin level of 84, folate of 4.9 and vitamin B12 level of 888 Started Folic Acid 1 mg po Daily  Appears to be relatively stable Continue to monitor   HLD Continue atorvastatin for now Monitor LFTs, as this may need to be help if liver function worsens   GERD/GI Prophylaxis Continue PPI   Severe malnutrition Nutrition Problem: Severe Malnutrition Etiology: chronic illness Signs/Symptoms: severe fat depletion, severe muscle depletion Interventions: Ensure Enlive (each supplement provides 350kcal and 20 grams of protein), MVI, Magic cup   DNR Code status changed after palliative consultation on 1/17        Consultants: Oncology Pulmonology Cardiology Palliative care [facility] team DM coordinator   Procedures: Echocardiogram 1/9   Antibiotics: Azithromycin 1/9-13 Ceftriaxone 1/9-14 Cefepime 1/14- Bactrim 1/14- Vancomycin x 1 Micafungin 1/16-  30 Day  Unplanned Readmission Risk Score    Flowsheet Row ED to Hosp-Admission (Current) from 06/07/2023 in [facility]-ICU/STEPDOWN  30 Day Unplanned Readmission Risk Score (%) 22.7 Filed at 06/15/2023 0401       This score is the patient's risk of an unplanned readmission within 30 days of being discharged (0 -100%). The score is based on dignosis, age, lab data, medications, orders, and past utilization.   Low:  0-14.9   Medium: 15-21.9   High: 22-29.9   Extreme: 30 and above           Subjective: Rough night with dyspnea.  Did not appreciate BIPAP.  Back on HFNC with some improvement.   Objective: Vitals:   06/15/23 0357 06/15/23 0400  BP:    Pulse: 67   Resp: (!) 22   Temp:  97.9 F (36.6 C)  SpO2: 90%     Intake/Output Summary (Last 24 hours) at 06/15/2023 0745 Last data filed at 06/15/2023 0346 Gross per 24 hour  Intake 304.81 ml  Output 500 ml  Net -195.19 ml   Filed Weights   06/13/23 0500 06/14/23 0500 06/15/23 0500  Weight: 85.1 kg 87.1 kg 87.1 kg    Exam:  General:  Appears ill, mild respiratory distress despite on HFNC O2, +conversational dyspnea today Eyes:  EOMI, normal lids, iris ENT:  hard of hearing, grossly normal lips & tongue, mmm Neck:  no LAD, masses or thyromegaly Cardiovascular:  RRR, no m/r/g. No LE edema.  Respiratory:   Scattered coarse rhonchi.   Moderately increased respiratory effort on HFNC O2 Abdomen:  soft, NT, ND Skin:  no rash or induration seen on limited exam Musculoskeletal:  grossly normal tone BUE/BLE, good ROM, no bony abnormality Psychiatric:  grossly normal mood and affect, speech fluent and appropriate, AOx3 Neurologic:  CN 2-12 grossly intact, moves all extremities in coordinated fashion  Data Reviewed: I have reviewed the patient's lab results since admission.  Pertinent labs for today include:   Glucose 115 BUN 52/Creatinine 1.36/GFR 51, stable AP 409, worsening Albumin 2.5 AST 105/ALT 100,  worsening WBC 16.9, down from 18.2 on 1/16 Hgb 12.1    Family Communication: Wife was present throughout evaluation  Disposition: Status is: Inpatient Remains inpatient appropriate because: critically ill   Total critical care time: 50 minutes Critical care time was exclusive of separately billable procedures and treating other patients. Critical care was necessary to treat or prevent imminent or life-threatening deterioration. Critical care was time spent personally by me on the following activities: development of treatment plan with patient and/or surrogate as well as nursing, discussions with consultants, evaluation of patient's response to treatment, examination of patient, obtaining history from patient or surrogate, ordering and performing treatments and interventions, ordering and review of laboratory studies, ordering and review of radiographic studies, pulse oximetry and re-evaluation of patient's condition.     Unresulted Labs (From admission, onward)     Start     Ordered   06/15/23 0500  CBC with Differential/Platelet  Daily,   R     Question:  Specimen collection method  Answer:  Lab=Lab collect   06/14/23 1313   06/15/23 0500  Comprehensive metabolic panel  Daily,   R     Question:  Specimen collection method  Answer:  Lab=Lab collect   06/14/23 1313   06/14/23 0949  ANA, IFA (with reflex)  (Autoimmune Panel)  Once,   R       Question:  Specimen collection method  Answer:  Lab=Lab collect   06/14/23 0948   06/14/23 0949  ANCA Profile  (Autoimmune Panel)  Once,   R       Question:  Specimen collection method  Answer:  Lab=Lab collect   06/14/23 0948   06/14/23 0949  CYCLIC CITRUL PEPTIDE ANTIBODY, IGG/IGA  (Autoimmune Panel)  Once,   R       Question:  Specimen collection method  Answer:  Lab=Lab collect   06/14/23 0948   06/14/23 0949  Aldolase  (Autoimmune Panel)  Once,   R       Question:  Specimen collection method  Answer:  Lab=Lab collect   06/14/23 0948    06/14/23 0949  Anti-scleroderma antibody  (Autoimmune Panel)  Once,   R       Question:  Specimen collection method  Answer:  Lab=Lab collect   06/14/23 0948   06/12/23 5409  Hypersensitivity Pneumonitis  Once,   R       Question:  Specimen collection method  Answer:  Lab=Lab collect   06/12/23 8119             Author: Jonah Blue, MD 06/15/2023 7:45 AM  For on call review [email or web address].

## 2023-06-15 NOTE — Progress Notes (Signed)
PHARMACY NOTE -  Cefepime  Pharmacy has been assisting with dosing of Cefepime for PNA. Dosage remains stable at 2g IV q12 hr and further renal adjustments per institutional Pharmacy antibiotic protocol  Pharmacy will sign off, following peripherally for culture results, dose adjustments, and length of therapy. Please reconsult if a change in clinical status warrants re-evaluation of dosage.  Bernadene Person, PharmD, BCPS [phone] 06/15/2023, 2:51 PM

## 2023-06-15 NOTE — Progress Notes (Signed)
   06/15/23 1500  Spiritual Encounters  Type of Visit Initial  Care provided to: Patient  Referral source Patient request;Chaplain assessment;Chaplain team;Clinical staff  Reason for visit Urgent spiritual support  OnCall Visit No  Spiritual Framework  Presenting Themes Meaning/purpose/sources of inspiration;Goals in life/care;Values and beliefs;Significant life change;Impactful experiences and emotions;Courage hope and growth;Rituals and practive;Community and relationships  Values/beliefs Strong Christian values and belief in place  Patient Stress Factors Health changes  Family Stress Factors None identified  Interventions  Spiritual Care Interventions Made Established relationship of care and support;Compassionate presence;Reflective listening;Normalization of emotions;Narrative/life review;Explored values/beliefs/practices/strengths;Meaning making;Meditation;Prayer;Mindfulness intervention   Chaplain met with patient this afternoon - he expressed his life narrative and memories of when God has been present for his distress in the past.  Spoke of his waking at night and stressed.  Chaplain provided guided meditation and prayer expercies for him at those times.  Guided meditation where he visualized being at the beach in his favorite place - [place], [place] - at [place].  Helped him learn [place] Breathing technique a well for stress reduction.   Anointed him with pil and prayed with him.   No emotional or spiritual distress exhibited at this time.

## 2023-06-15 NOTE — Progress Notes (Signed)
Monitor showing ST elevation in a few leads. 12 lead EKG read NSR. Will continue to monitor.

## 2023-06-15 NOTE — Progress Notes (Addendum)
Progress Note  Patient Name: Corey [name] Date of Encounter: 06/15/2023  Primary Cardiologist: Chrystie Nose, MD  Subjective   Reports breathing is about the same, still short at times, but resting comfortably on HFNC upon arrival. No CP.   Inpatient Medications    Scheduled Meds:  apixaban  5 mg Oral BID   atorvastatin  20 mg Oral Daily   budesonide (PULMICORT) nebulizer solution  0.25 mg Nebulization BID   buPROPion  150 mg Oral q morning   busPIRone  5 mg Oral QHS   Chlorhexidine Gluconate Cloth  6 each Topical Daily   feeding supplement  237 mL Oral BID BM   flecainide  100 mg Oral BID   folic acid  1 mg Oral Daily   guaiFENesin  1,200 mg Oral BID   insulin aspart  0-20 Units Subcutaneous TID WC   insulin aspart  0-5 Units Subcutaneous QHS   insulin aspart  4 Units Subcutaneous TID WC   insulin glargine-yfgn  10 Units Subcutaneous Daily   ipratropium  0.5 mg Nebulization TID   levalbuterol  0.63 mg Nebulization TID   mirtazapine  15 mg Oral QHS   multivitamin with minerals  1 tablet Oral Daily   pantoprazole  40 mg Oral Daily   predniSONE  60 mg Oral Q breakfast   sulfamethoxazole-trimethoprim  2 tablet Oral Q8H   Continuous Infusions:  ceFEPime (MAXIPIME) IV Stopped (06/15/23 0308)   micafungin (MYCAMINE) 100 mg in sodium chloride 0.9 % 100 mL IVPB 100 mg (06/15/23 1016)   PRN Meds: acetaminophen **OR** acetaminophen, albuterol, morphine, morphine CONCENTRATE, ondansetron **OR** ondansetron (ZOFRAN) IV, mouth rinse, traZODone   Vital Signs    Vitals:   06/15/23 0824 06/15/23 0849 06/15/23 0854 06/15/23 0900  BP:   129/64 132/81  Pulse:    77  Resp:    (!) 24  Temp: (!) 97.4 F (36.3 C)     TempSrc: Axillary     SpO2:  92%  91%  Weight:      Height:        Intake/Output Summary (Last 24 hours) at 06/15/2023 1031 Last data filed at 06/15/2023 0346 Gross per 24 hour  Intake 304.81 ml  Output 500 ml  Net -195.19 ml      06/15/2023    5:00  AM 06/14/2023    5:00 AM 06/13/2023    5:00 AM  Last 3 Weights  Weight (lbs) 192 lb 0.3 oz 192 lb 0.3 oz 187 lb 9.8 oz  Weight (kg) 87.1 kg 87.1 kg 85.1 kg     Telemetry    NSR - Personally Reviewed  ECG    NSR RBBB possible LAFB nonspecific STTW changes in setting of bundle - Personally Reviewed  Physical Exam   GEN: No acute distress.  HEENT: Normocephalic, atraumatic, sclera non-icteric. Neck: No JVD or bruits. Cardiac: RRR no murmurs, rubs, or gallops.  Respiratory: bilateral dry crackles bilaterally. Breathing is unlabored on HFNC GI: Soft, nontender, non-distended, BS +x 4. MS: no deformity. Extremities: No clubbing or cyanosis. No edema. Distal pedal pulses are 2+ and equal bilaterally. Neuro:  AAOx3. Follows commands. Psych:  Responds to questions appropriately with a normal affect.  Labs    High Sensitivity Troponin:   Recent Labs  Lab 06/07/23 0909 06/07/23 1158  TROPONINIHS 16 16      Cardiac EnzymesNo results for input(s): "TROPONINI" in the last 168 hours. No results for input(s): "TROPIPOC" in the last 168 hours.   Chemistry  Recent Labs  Lab 06/13/23 0258 06/14/23 0251 06/15/23 0257  NA 136 135 136  K 3.8 3.7 4.3  CL 101 99 101  CO2 22 25 24   GLUCOSE 128* 94 115*  BUN 50* 58* 52*  CREATININE 1.31* 1.47* 1.36*  CALCIUM 8.7* 8.8* 8.9  PROT 5.8* 6.5 5.9*  ALBUMIN 2.4* 2.7* 2.5*  AST 60* 69* 105*  ALT 45* 60* 100*  ALKPHOS 338* 351* 409*  BILITOT 0.7 0.9 1.1  GFRNONAA 53* 46* 51*  ANIONGAP 13 11 11      Hematology Recent Labs  Lab 06/13/23 0258 06/14/23 0251 06/15/23 0257  WBC 14.0* 18.2* 16.9*  RBC 4.07* 4.38 4.28  HGB 11.7* 12.6* 12.1*  HCT 35.8* 38.6* 38.2*  MCV 88.0 88.1 89.3  MCH 28.7 28.8 28.3  MCHC 32.7 32.6 31.7  RDW 13.5 13.7 14.1  PLT 313 369 328    BNP Recent Labs  Lab 06/11/23 0516 06/12/23 0942 06/13/23 0258  BNP 446.9* 243.2* 174.5*     DDimer No results for input(s): "DDIMER" in the last 168 hours.    Radiology    DG CHEST PORT 1 VIEW Result Date: 06/15/2023 CLINICAL DATA:  Dyspnea. EXAM: PORTABLE CHEST 1 VIEW COMPARISON:  06/14/2023 and older studies. FINDINGS: Hazy, bilateral, left greater than right, airspace opacities are without significant change from the previous day's study. No new lung abnormalities. No convincing pleural effusion.  No pneumothorax. Cardiac silhouette normal in size. IMPRESSION: 1. No interval change. Persistent, left greater than right airspace lung opacities. Electronically Signed   By: Amie Portland M.D.   On: 06/15/2023 08:01   US Abdomen Limited RUQ (LIVER/GB) Result Date: 06/15/2023 CLINICAL DATA:  Elevated liver enzymes. Previous PET-CT describes neuroendocrine tumor with liver metastatic disease. EXAM: ULTRASOUND ABDOMEN LIMITED RIGHT UPPER QUADRANT COMPARISON:  PET-CT, 04/16/2023. CT chest, abdomen and pelvis, 03/21/2023. FINDINGS: Gallbladder: Surgically absent Common bile duct: Diameter: 4 mm, not well visualized. Liver: Numerous masses throughout the entire liver. Masses relatively hyperechoic, with additional areas of relative decreased echogenicity. Relatively discrete hyperechoic mass posterior right lobe measures 8.5 x 8 point by 10.1 cm. Second mixed echogenicity, but predominantly hyperechoic mass in the left lobe measures 8.0 x 5.8 x 6 cm. Heterogeneous, hepatopetal flow noted in the portal vein. Other: None. IMPRESSION: 1. Extensive metastatic disease throughout the liver. This is similar to the extensive disease evident on the prior PET-CT. 2. No acute findings.  No bile duct dilation. Electronically Signed   By: Amie Portland M.D.   On: 06/15/2023 08:00   DG CHEST PORT 1 VIEW Result Date: 06/14/2023 CLINICAL DATA:  Dyspnea. EXAM: PORTABLE CHEST 1 VIEW COMPARISON:  June 13, 2023. FINDINGS: Stable cardiomediastinal silhouette. Stable bilateral lung opacities are noted, left greater than right, concerning for multifocal pneumonia or possibly edema. Bony  thorax is unremarkable. IMPRESSION: Stable bilateral lung opacities as noted above. Electronically Signed   By: Lupita Raider M.D.   On: 06/14/2023 10:49    Cardiac Studies   Echo 06/07/23   1. Left ventricular ejection fraction, by estimation, is 50 to 55%. Left  ventricular ejection fraction by 2D MOD biplane is 50.5 %. The left  ventricle has low normal function. The left ventricle has no regional wall  motion abnormalities. Left  ventricular diastolic parameters are consistent with Grade I diastolic  dysfunction (impaired relaxation).   2. Right ventricular systolic function is mildly reduced. The right  ventricular size is normal.   3. The mitral valve is grossly normal. Trivial  mitral valve  regurgitation.   4. The aortic valve is tricuspid. Aortic valve regurgitation is not  visualized. Aortic valve sclerosis/calcification is present, without any  evidence of aortic stenosis.   5. The inferior vena cava is normal in size with greater than 50%  respiratory variability, suggesting right atrial pressure of 3 mmHg.   Comparison(s): Prior images unable to be directly viewed, comparison made  by report only. Changes from prior study are noted. 07/28/2015: LVEF 50-55%.   Patient Profile     89 y.o. male with a history of mild non-obstructive CAD on coronary CTA in 02/2019, chronic HFpEF, suspected CKD stage 2, paroxysmal atrial fibrillation on Eliquis, bifascicular block with RBBB/LAFB, hypertension, type 2 diabetes mellitus, iron deficiency anemia, BPH, and stage IV neuroendocrine tumor (dx 03/2023) who was admitted on 06/07/2023 for acute hypoxic respiratory failure secondary to community acquired pneumonia and possible CHF. He was started on IV antibiotics and initially had some improvement. However, he then developed worsening respiratory distress and Cardiology was consulted for assistance with diuresis. He ultimately had to be transferred to the stepdown on 1/14 due to continued need  for high flow O2. He was seen by PCCM and was started on steroid taper for possible pneumonitis.   Assessment & Plan    1. Acute hypoxemic respiratory failure with pulmonary infiltrates - per PCCM, differential diagnosis includes infectious etiology (bacterial, fungal vs atypical) vs pneumonitis reaction to recent everolimus vs pulmonary lymphangitic carcinomatosis from his neuroendoocrine disease  2. Acute on chronic diastolic HF - received IV Lasix this admission but pulm issues have persisted indicating probable primary pulm process - further diuretics were held in setting of uptrending Cr/AKI and dry appearance  3. Nonobstructive CAD 02/2019 - not on ASA in setting of Eliquis - no chest pain, troponins negative - hold statin in setting of elevated LFTs so as not to confuse the picture - nursing notes indicate some ST elevation noted, EKG reviewed and continues to show chronic RBBB with associated nonspecific STTW changes  4. Paroxysmal atrial fibrillation - maintained on flecainide, follow renal/hepatic parameters. At present parameters UTD suggests use with caution and monitor - also has historical bifascicular block with RBBB+LAFB, will need to monitor if flecainide continued - on Elqiuis  5. AKI on CKD stage 2 - most recent baseline 0.9-1.3 - peak 1.47 - ARB, diuretics held - stable today - BP OK    For questions or updates, please contact [facility] Please consult [email or web address] for contact info under Cardiology/STEMI.  Signed, Laurann Montana, PA-C 06/15/2023, 10:31 AM

## 2023-06-15 NOTE — TOC Progression Note (Addendum)
Transition of Care [facility]) - Progression Note    Patient Details  Name: Corey [name] MRN: [id number] Date of Birth: [birth year]  [facility] CM/SW Contact  Lavenia Atlas, RN Phone Number: 06/15/2023, 3:23 PM  Clinical Narrative:   Per chart review patient currently in [facility] SDU for pneumonia of both lungs, HFNC. Patient being followed by palliative with DNR(limited) status.   TOC folllowing for needs.    Expected Discharge Plan:  (unknown) Barriers to Discharge: Continued Medical Work up  Expected Discharge Plan and Services In-house Referral: NA Discharge Planning Services: CM Consult Post Acute Care Choice:  (unknown at this time (potential for home oxygen)) Living arrangements for the past 2 months: Single Family Home                 DME Arranged: N/A DME Agency: NA       HH Arranged: NA HH Agency: NA         Social Determinants of Health (SDOH) Interventions SDOH Screenings   Food Insecurity: No Food Insecurity (06/07/2023)  Housing: Low Risk  (06/07/2023)  Transportation Needs: No Transportation Needs (06/07/2023)  Utilities: Not At Risk (06/07/2023)  Social Connections: Moderately Integrated (06/07/2023)  Tobacco Use: Medium Risk (06/07/2023)    Readmission Risk Interventions    06/13/2023    6:36 PM 06/08/2023    3:00 PM  Readmission Risk Prevention Plan  Post Dischage Appt  Complete  Medication Screening  Complete  Transportation Screening Complete Complete  PCP or Specialist Appt within 3-5 Days Complete   HRI or Home Care Consult Complete   Social Work Consult for Recovery Care Planning/Counseling Complete   Palliative Care Screening Not Applicable   Medication Review Oceanographer) Complete

## 2023-06-15 NOTE — Progress Notes (Signed)
NAME:  Corey [name], MRN:  [id number], DOB:  09-Jul-1934, LOS: 7 ADMISSION DATE:  06/07/2023, CONSULTATION DATE: 06/10/2023 REFERRING MD: Dr. Marland Mcalpine, CHIEF COMPLAINT: Acute hypoxemic respiratory failure  History of Present Illness:  89 year old man with a history of former tobacco, newly diagnosed metastatic neuroendocrine tumor followed by Dr. Mosetta Putt.  Also with atrial fibrillation (now NSR), hypertension, CAD with a reassuring cardiac stress test 2023.  EF 51%.  For his neuroendocrine tumor he has been treated initially with Lutathera (stopped for transaminitis) and then everolimus for 3 weeks (stopped 1/6).  At that visit 1/6 he was found to be hypoxemic and had patchy bilateral pulmonary infiltrates on chest x-ray.  Subsequently seen by Dr. Rennis Golden with cardiology and started on furosemide for possible component of pulmonary edema.  Now admitted 06/07/2023 with progressive dyspnea and cough.  He was started on azithromycin and ceftriaxone for possible community-acquired pneumonia.  Chest x-ray at the time of admission shows progression of left-sided hazy pulmonary infiltrates.  Pertinent  Medical History   Past Medical History:  Diagnosis Date   Basal cell carcinoma of auricle of right ear    BPH (benign prostatic hyperplasia)    DJD (degenerative joint disease)    left   ED (erectile dysfunction)    Fatigue    Hearing loss, sensorineural    Hx of adenomatous colonic polyps    Hx of major depression    Hypertension    Iron deficiency anemia    Nephrolithiasis    Paroxysmal atrial fibrillation (HCC)    Seasonal allergic rhinitis   Metastatic neuroendocrine tumor Atrial fibrillation   Significant Hospital Events: Including procedures, antibiotic start and stop dates in addition to other pertinent events   1/14 increase in O2 requirements overnight, transferred to step down unit on [facility] 1/15 on HHFNC 60L 80% FiO2 1/16 on HHFNC 55L 90% FiO2, tried bipap overnight  Interim History  / Subjective:   Feeling more short of breath today, O2 Saturations maintaining on HHFNC He did not like bipap overnight  Objective   Blood pressure (!) 115/59, pulse 67, temperature 97.9 F (36.6 C), temperature source Axillary, resp. rate (!) 22, height 6\' 1"  (1.854 m), weight 87.1 kg, SpO2 90%.    FiO2 (%):  [60 %-93 %] 60 %   Intake/Output Summary (Last 24 hours) at 06/15/2023 0726 Last data filed at 06/15/2023 0346 Gross per 24 hour  Intake 304.81 ml  Output 500 ml  Net -195.19 ml   Filed Weights   06/12/23 1300 06/13/23 0500 06/14/23 0500  Weight: 85.6 kg 85.1 kg 87.1 kg    Examination: General: Elderly man laying in bed in mild distress on HHFNC HENT: Oropharynx clear, strong voice, no secretions, no stridor Lungs: bronchial breath sounds, scattered rales, no wheezing, increased work of breathing with accessory muscles Cardiovascular: Regular, distant, no murmur Abdomen: Nondistended with positive bowel sounds Extremities: No peripheral edema Neuro: Awake, alert, interacting appropriately, follows commands GU: Deferred  Resolved Hospital Problem list     Assessment & Plan:  Acute hypoxemic respiratory failure with pulmonary infiltrates.   Differential diagnosis includes infectious etiology (bacterial, fungal vs atypical) vs pneumonitis reaction to recent everolimus vs pulmonary lymphangitic carcinomatosis from his neuroendoocrine disease. -Continue cefepime and bactrim - LDH elevated, started on empiric bactrim for pneumocystis coverage 1/14 - Fungitell positive, mycafungin started on 1/16 - MRSA screen negative - BNP trended down with diuresis - Completed 3 days of solumedrol 125mg  BID. - Continue 60mg  prednisone daily today.  - F/u  autoimmune panel. Urinalysis without blood or RBCs, less concerning for vasculititis process -  Avoiding bronchoscopy as he will be vent dependent after procedure - continue HHFNC  - palliative adding morphine PRN  Blood culture  Staph hominis 1 of 4 -Unclear significance, suspect contaminant -Currently covered with ceftriaxone  Hypertension Paroxysmal atrial fibrillation HFpEF -cardiology following   Metastatic neuroendocrine tumor -Appreciate Dr. Latanya Maudlin assistance  Hyponatremia Hypokalemia    Labs   CBC: Recent Labs  Lab 06/11/23 0512 06/12/23 0535 06/13/23 0258 06/14/23 0251 06/15/23 0257  WBC 5.4 12.5* 14.0* 18.2* 16.9*  NEUTROABS 4.8 11.4* 12.2* 15.0* 14.4*  HGB 13.2 12.8* 11.7* 12.6* 12.1*  HCT 40.6 39.5 35.8* 38.6* 38.2*  MCV 87.9 88.0 88.0 88.1 89.3  PLT 286 358 313 369 328    Basic Metabolic Panel: Recent Labs  Lab 06/09/23 0841 06/10/23 0610 06/11/23 0512 06/12/23 0535 06/13/23 0258 06/14/23 0251 06/15/23 0257  NA 134* 136 137 132* 136 135 136  K 3.8 4.2 4.1 4.0 3.8 3.7 4.3  CL 99 97* 99 96* 101 99 101  CO2 24 29 24 25 22 25 24   GLUCOSE 193* 124* 262* 351* 128* 94 115*  BUN 17 21 33* 44* 50* 58* 52*  CREATININE 0.94 1.01 1.29* 1.16 1.31* 1.47* 1.36*  CALCIUM 8.4* 8.5* 8.8* 8.7* 8.7* 8.8* 8.9  MG 2.2 2.3 2.5* 2.8* 2.8*  --   --   PHOS 2.8 3.3 5.0* 4.0 3.9  --   --    GFR: Estimated Creatinine Clearance: 44.1 mL/min (A) (by C-G formula based on SCr of 1.36 mg/dL (H)). Recent Labs  Lab 06/12/23 0535 06/13/23 0258 06/14/23 0251 06/14/23 1001 06/15/23 0257  PROCALCITON  --   --   --  0.21  --   WBC 12.5* 14.0* 18.2*  --  16.9*    Liver Function Tests: Recent Labs  Lab 06/11/23 0512 06/12/23 0535 06/13/23 0258 06/14/23 0251 06/15/23 0257  AST 60* 51* 60* 69* 105*  ALT 34 33 45* 60* 100*  ALKPHOS 411* 372* 338* 351* 409*  BILITOT 1.1 1.0 0.7 0.9 1.1  PROT 6.7 6.4* 5.8* 6.5 5.9*  ALBUMIN 2.6* 2.6* 2.4* 2.7* 2.5*   No results for input(s): "LIPASE", "AMYLASE" in the last 168 hours.  No results for input(s): "AMMONIA" in the last 168 hours.  ABG    Component Value Date/Time   PHART 7.43 06/12/2023 1300   PCO2ART 38 06/12/2023 1300   PO2ART 57 (L)  06/12/2023 1300   HCO3 25.3 06/12/2023 1300   O2SAT 90.3 06/12/2023 1300     Coagulation Profile: No results for input(s): "INR", "PROTIME" in the last 168 hours.  Cardiac Enzymes: Recent Labs  Lab 06/14/23 1001  CKTOTAL 139    HbA1C: Hgb A1c MFr Bld  Date/Time Value Ref Range Status  06/12/2023 01:03 PM 7.9 (H) 4.8 - 5.6 % Final    Comment:    (NOTE) Pre diabetes:          5.7%-6.4%  Diabetes:              >6.4%  Glycemic control for   <7.0% adults with diabetes     CBG: Recent Labs  Lab 06/13/23 2134 06/14/23 0728 06/14/23 1126 06/14/23 1516 06/14/23 2147  GLUCAP 92 75 176* 319* 135*    Critical care time: NA     Melody Comas, MD [facility] Office: [phone]   See Amion for personal pager PCCM on call pager [phone] until 7pm.  Please call Elink 7p-7a. [phone]

## 2023-06-15 NOTE — Progress Notes (Signed)
RT note: Pt. currently remains on Heated HFNC and is tolerating currently very well, stated that he was unable to tolerated BiPAP V60 earlier and wishes to remain on current therapy, RN made aware, RT to monitor.

## 2023-06-15 NOTE — Progress Notes (Signed)
Daily Progress Note   Patient Name: Corey [name]       Date: 06/15/2023 DOB: [birth year]  Age: 89 y.o. MRN#: [id number] Attending Physician: Jonah Blue, MD Primary Care Physician: Emilio Aspen, MD Admit Date: 06/07/2023 Length of Stay: 7 days  Reason for Consultation/Follow-up: Establishing goals of care  Subjective:   CC: Patient notes breathing slightly improved with morphine.  Following up regarding complex medical decision making.  Subjective:  Reviewed EMR prior to presenting to bedside.  At time of EMR in past 24 hours patient has received as needed IV morphine 2 mg x 1 dose, as needed oral morphine 2 mg concentrate x 1 dose, and as needed inhaled morphine x 2 doses. Discussed care with RN for medical updates.  Also discussed care with PCCM provider.  Concerned about patient's work of breathing leading to intubation.  Again concern expressed that intubation would not lead to quality of life for patient and family would need to make difficult decision regarding palliative extubation.  PCCM provider noted that fungal infection has been identified and patient is appropriately on therapies.  Hopeful patient can improve while receiving high flow nasal cannula support with these therapies.  Presented to bedside to discuss care with patient.  No family initially present in the morning when seeing patients.  Patient welcoming conversation at that time.  Initially inquired about patient's symptom burden.  Patient does feel that when he receives the morphine it helps to relax his breathing a little.  Discussed that he is still having increased work of breathing.  Patient denied any adverse effects to morphine he has previously received.  Discussed increasing dose of morphine to determine if will help alleviate patient's work of breathing further.  Patient agreeing with this plan. With permission, also able to discuss CODE STATUS with patient again.  Patient had considered what was  discussed yesterday.  Patient can state he does not want to get onto a ventilator at all.  Patient does not want cardiac resuscitation or ventilator for life support.  Patient willing to continue with appropriate medical therapies at this time including high flow nasal cannula and BiPAP.  Patient does not want to lose the ability to communicate with his family by being on ventilator support and does not want his family to have to make a difficult decision to palliative extubate as he knows he would never come off ventilator support.  Acknowledged this.  Discussed and noted would appropriately change CODE STATUS to DNR/DNI while continuing all appropriate other forms of medical care. Inquired with patient if should call his wife or if she would be visiting today.  Patient noted she would be present in the afternoon.  Noted this provider to return to bedside and update her regarding conversation.  Patient agreeing with this plan.  Presented to bedside later in afternoon.  Patient resting quietly in bed though easily awakened.  Patient able to state his work of breathing does feel better with the high-dose of morphine.  Noted can schedule if needed; still has available as needed.  Patient voiced appreciation for this. Patient's wife at bedside during this visit.  With permission, again reviewed what had been discussed with patient earlier in the day regarding CODE STATUS.  Discussed CODE STATUS has appropriately been changed to DNR/DNI as patient does not want to end up on a ventilator or receive cardiac resuscitation.  Patient will be continuing appropriate medical interventions and support with high flow nasal cannula and BiPAP.  Patient wants  to be able to communicate with his family.  Wife is supporting of changing CODE STATUS to DNR/DNI. Spent time providing emotional support reactive listening as wife appropriately tearful during discussion.  Wife described how they got married when she was 8 and so they  have been together 66 years.  Wife cannot imagine life without him.  Discussed hope patient would be able to improve with antifungal medication.  Would continue appropriate medical interventions, would just not want to do medical interventions to get patients will such as ventilator support.  Wife acknowledges.  Discussed other avenues to support wife at this time.  Discussed spiritual support and wife would appreciate this.  Noted would reach out to chaplain.  All questions answered at that time.  Noted palliative medicine team will continue to follow along with patient's medical journey.  Objective:   Vital Signs:  BP 132/81   Pulse 77   Temp (!) 97.4 F (36.3 C) (Axillary)   Resp (!) 24   Ht 6\' 1"  (1.854 m)   Wt 87.1 kg   SpO2 91%   BMI 25.33 kg/m   Physical Exam: General: NAD, alert, laying in bed, chronically ill appearing  HENT: HFNC in place Cardiovascular: RRR Respiratory: increased work of breathing noted, on HFNC Neuro: A&Ox4, following commands easily Psych: appropriately answers all questions  Imaging: I personally reviewed recent imaging.   Assessment & Plan:   Assessment: Patient is an 89 year old male with a past medical history of A-fib on Eliquis, BPH, and recently diagnosed stage IV metastatic neuroendocrine tumor with unknown primary who was admitted on 06/07/2023 for management of weakness and shortness of breath.  Patient had recently been started Afinitor for cancer management.  This was discontinued as patient was unable to tolerate in setting of significant fatigue and cough.  Patient was referred to Dr. Ty Hilts for [facility] therapy though has been hospitalized and unable to attend this appointment.  During hospitalization patient has had worsening acute respiratory failure with noted hypoxia.  Pulmonology following to assist with management.  Oncology following in setting of metastatic neuroendocrine tumor.  Cardiology consulted for concerns of cardiogenic  edema.  Palliative medicine team consulted to assist with complex medical decision making.   Recommendations/Plan: # Complex medical decision making/goals of care:     -Extensive discussion with patient today as detailed above in HPI.  Also return to bedside later in the day to speak to wife.  Discussed CODE STATUS with patient after had consulted to PCCM.  Concerned about patient's work of breathing leading to ventilator support.  Patient himself can verbalize that he would never want to go on a ventilator or receive cardiac resuscitation.  Patient acknowledges that if he was to go on a ventilator, he would likely not come off and that is not quality of life to him.  Patient wants to be able to communicate with his family as long as possible.  Discussed CODE STATUS.  Patient electing for DNR/DNI at this time.  Will continue all appropriate medical interventions up until this point including high flow nasal cannula and BiPAP support.  Wife supporting of patient's wishes regarding this.  # Symptom management Patient is receiving these palliative interventions for symptom management with an intent to improve quality of life.                 -Dyspnea, in setting of acute hypoxic respiratory failure possibly multifactorial                              -  Increase morphine oral solution to 5 mg every 2 hours as needed   -Start IV morphine 2 mg every 3 hours as needed for breakthrough management after oral morphine or [facility] patient on BiPAP overnight and unable to take oral medications                               -Continue morphine for inhalation 10 mg every 3 hours as needed.  Can use oral morphine and inhaled morphine since inhaled morphine does not cause systemic effects.                               -Monitor kidney function while receiving oral morphine   # Psycho-social/Spiritual Support:  - Support System: wife  # Discharge Planning: To Be Determined  Discussed with: Patient, patient's  wife, PCCM, RN  Thank you for allowing the palliative care team to participate in the care [name].  Alvester Morin, DO Palliative Care Provider PMT # [phone]  If patient remains symptomatic despite maximum doses, please call PMT at [phone] between 0700 and 1900. Outside of these hours, please call attending, as PMT does not have night coverage.  Personally spent 65 minutes in patient care including extensive chart review (labs, imaging, progress/consult notes, vital signs), medically appropraite exam, discussed with treatment team, education to patient, family, and staff, documenting clinical information, medication review and management, coordination of care, and available advanced directive documents.

## 2023-06-16 DIAGNOSIS — J168 Pneumonia due to other specified infectious organisms: Secondary | ICD-10-CM | POA: Diagnosis not present

## 2023-06-16 DIAGNOSIS — J9601 Acute respiratory failure with hypoxia: Secondary | ICD-10-CM | POA: Diagnosis not present

## 2023-06-16 DIAGNOSIS — C787 Secondary malignant neoplasm of liver and intrahepatic bile duct: Secondary | ICD-10-CM | POA: Diagnosis not present

## 2023-06-16 DIAGNOSIS — B49 Unspecified mycosis: Secondary | ICD-10-CM | POA: Diagnosis not present

## 2023-06-16 DIAGNOSIS — Z515 Encounter for palliative care: Secondary | ICD-10-CM | POA: Diagnosis not present

## 2023-06-16 LAB — GLUCOSE, CAPILLARY
Glucose-Capillary: 89 mg/dL (ref 70–99)
Glucose-Capillary: 90 mg/dL (ref 70–99)
Glucose-Capillary: 144 mg/dL — ABNORMAL HIGH (ref 70–99)
Glucose-Capillary: 146 mg/dL — ABNORMAL HIGH (ref 70–99)

## 2023-06-16 LAB — COMPREHENSIVE METABOLIC PANEL
ALT: 77 U/L — ABNORMAL HIGH (ref 0–44)
AST: 62 U/L — ABNORMAL HIGH (ref 15–41)
Albumin: 2.5 g/dL — ABNORMAL LOW (ref 3.5–5.0)
Alkaline Phosphatase: 408 U/L — ABNORMAL HIGH (ref 38–126)
Anion gap: 10 (ref 5–15)
BUN: 48 mg/dL — ABNORMAL HIGH (ref 8–23)
CO2: 24 mmol/L (ref 22–32)
Calcium: 9 mg/dL (ref 8.9–10.3)
Chloride: 98 mmol/L (ref 98–111)
Creatinine, Ser: 1.12 mg/dL (ref 0.61–1.24)
GFR, Estimated: >60 mL/min (ref >60)
Glucose, Bld: 127 mg/dL — ABNORMAL HIGH (ref 70–99)
Potassium: 4.9 mmol/L (ref 3.5–5.1)
Sodium: 132 mmol/L — ABNORMAL LOW (ref 135–145)
Total Bilirubin: 1 mg/dL (ref 0.0–1.2)
Total Protein: 6.1 g/dL — ABNORMAL LOW (ref 6.5–8.1)

## 2023-06-16 LAB — CBC WITH DIFFERENTIAL/PLATELET
Abs Immature Granulocytes: 0.19 10*3/uL — ABNORMAL HIGH (ref 0.00–0.07)
Basophils Absolute: 0.1 10*3/uL (ref 0.0–0.1)
Basophils Relative: 0 %
Eosinophils Absolute: 0.1 10*3/uL (ref 0.0–0.5)
Eosinophils Relative: 1 %
HCT: 38.1 % — ABNORMAL LOW (ref 39.0–52.0)
Hemoglobin: 12.2 g/dL — ABNORMAL LOW (ref 13.0–17.0)
Immature Granulocytes: 1 %
Lymphocytes Relative: 3 %
Lymphs Abs: 0.5 10*3/uL — ABNORMAL LOW (ref 0.7–4.0)
MCH: 28.6 pg (ref 26.0–34.0)
MCHC: 32 g/dL (ref 30.0–36.0)
MCV: 89.4 fL (ref 80.0–100.0)
Monocytes Absolute: 1.5 10*3/uL — ABNORMAL HIGH (ref 0.1–1.0)
Monocytes Relative: 9 %
Neutro Abs: 14.2 10*3/uL — ABNORMAL HIGH (ref 1.7–7.7)
Neutrophils Relative %: 86 %
Platelets: 352 10*3/uL (ref 150–400)
RBC: 4.26 MIL/uL (ref 4.22–5.81)
RDW: 14.4 % (ref 11.5–15.5)
WBC: 16.5 10*3/uL — ABNORMAL HIGH (ref 4.0–10.5)
nRBC: 0 % (ref 0.0–0.2)

## 2023-06-16 LAB — ANTINUCLEAR ANTIBODIES, IFA: ANA Ab, IFA: NEGATIVE

## 2023-06-16 MED ORDER — MORPHINE SULFATE (CONCENTRATE) 10 MG /0.5 ML PO SOLN
5.0000 mg | ORAL | Status: DC
  Administered 2023-06-16 – 2023-06-17 (×5): 5 mg via ORAL
  Filled 2023-06-16 (×5): qty 0.5

## 2023-06-16 NOTE — Progress Notes (Signed)
NAME:  Corey [name], MRN:  [id number], DOB:  [birth year], LOS: 8 ADMISSION DATE:  06/07/2023, CONSULTATION DATE: 06/10/2023 REFERRING MD: Dr. Marland Mcalpine, CHIEF COMPLAINT: Acute hypoxemic respiratory failure  History of Present Illness:  89 year old man with a history of former tobacco, newly diagnosed metastatic neuroendocrine tumor followed by Dr. Mosetta Putt.  Also with atrial fibrillation (now NSR), hypertension, CAD with a reassuring cardiac stress test 2023.  EF 51%.  For his neuroendocrine tumor he has been treated initially with Lutathera (stopped for transaminitis) and then everolimus for 3 weeks (stopped 1/6).  At that visit 1/6 he was found to be hypoxemic and had patchy bilateral pulmonary infiltrates on chest x-ray.  Subsequently seen by Dr. Rennis Golden with cardiology and started on furosemide for possible component of pulmonary edema.  Now admitted 06/07/2023 with progressive dyspnea and cough.  He was started on azithromycin and ceftriaxone for possible community-acquired pneumonia.  Chest x-ray at the time of admission shows progression of left-sided hazy pulmonary infiltrates.  Pertinent  Medical History   Past Medical History:  Diagnosis Date   Basal cell carcinoma of auricle of right ear    BPH (benign prostatic hyperplasia)    DJD (degenerative joint disease)    left   ED (erectile dysfunction)    Fatigue    Hearing loss, sensorineural    Hx of adenomatous colonic polyps    Hx of major depression    Hypertension    Iron deficiency anemia    Nephrolithiasis    Paroxysmal atrial fibrillation (HCC)    Seasonal allergic rhinitis   Metastatic neuroendocrine tumor Atrial fibrillation   Significant Hospital Events: Including procedures, antibiotic start and stop dates in addition to other pertinent events   1/14 increase in O2 requirements overnight, transferred to step down unit on [facility] 1/15 on HHFNC 60L 80% FiO2 1/16 on HHFNC 55L 90% FiO2, tried bipap overnight 1/17 remained on  HHFNC 60L 90% FiO2  Interim History / Subjective:   Continues to have significant dyspnea Family at bedside  Objective   Blood pressure 139/60, pulse 69, temperature 97.7 F (36.5 C), temperature source Oral, resp. rate 17, height 6\' 1"  (1.854 m), weight 89.3 kg, SpO2 92%.    FiO2 (%):  [85 %-95 %] 95 %   Intake/Output Summary (Last 24 hours) at 06/16/2023 1813 Last data filed at 06/16/2023 1600 Gross per 24 hour  Intake 545 ml  Output 1200 ml  Net -655 ml   Filed Weights   06/14/23 0500 06/15/23 0500 06/16/23 0500  Weight: 87.1 kg 87.1 kg 89.3 kg    Examination: General: Elderly man laying in bed in mild distress on HHFNC HENT: Oropharynx clear, strong voice, no secretions, no stridor Lungs: bronchial breath sounds, scattered rales, no wheezing, mildly increased work of breathing with accessory muscles Cardiovascular: Regular, distant, no murmur Abdomen: Nondistended with positive bowel sounds Extremities: No peripheral edema Neuro: Awake, alert, interacting appropriately, follows commands GU: Deferred  Resolved Hospital Problem list     Assessment & Plan:  Acute hypoxemic respiratory failure with pulmonary infiltrates.   Differential diagnosis includes infectious etiology (bacterial, fungal vs atypical) vs pneumonitis reaction to recent everolimus vs pulmonary lymphangitic carcinomatosis from his neuroendoocrine disease. -Continue cefepime and bactrim - LDH elevated, started on empiric bactrim for pneumocystis coverage 1/14 - Fungitell positive, mycafungin started on 1/16 - MRSA screen negative - Completed 3 days of solumedrol 125mg  BID. - Continue 60mg  prednisone daily today.  - Autoimmune panel is negative -  Avoiding bronchoscopy as he  will be vent dependent after procedure - continue HHFNC  - palliative following, morphine PRN  Blood culture Staph hominis 1 of 4 -Unclear significance, suspect contaminant -Currently covered with  ceftriaxone  Hypertension Paroxysmal atrial fibrillation HFpEF -cardiology following   Metastatic neuroendocrine tumor -Appreciate Dr. Latanya Maudlin assistance  Hyponatremia Hypokalemia    Labs   CBC: Recent Labs  Lab 06/12/23 0535 06/13/23 0258 06/14/23 0251 06/15/23 0257 06/16/23 0311  WBC 12.5* 14.0* 18.2* 16.9* 16.5*  NEUTROABS 11.4* 12.2* 15.0* 14.4* 14.2*  HGB 12.8* 11.7* 12.6* 12.1* 12.2*  HCT 39.5 35.8* 38.6* 38.2* 38.1*  MCV 88.0 88.0 88.1 89.3 89.4  PLT 358 313 369 328 352    Basic Metabolic Panel: Recent Labs  Lab 06/10/23 0610 06/11/23 0512 06/12/23 0535 06/13/23 0258 06/14/23 0251 06/15/23 0257 06/16/23 0311  NA 136 137 132* 136 135 136 132*  K 4.2 4.1 4.0 3.8 3.7 4.3 4.9  CL 97* 99 96* 101 99 101 98  CO2 29 24 25 22 25 24 24   GLUCOSE 124* 262* 351* 128* 94 115* 127*  BUN 21 33* 44* 50* 58* 52* 48*  CREATININE 1.01 1.29* 1.16 1.31* 1.47* 1.36* 1.12  CALCIUM 8.5* 8.8* 8.7* 8.7* 8.8* 8.9 9.0  MG 2.3 2.5* 2.8* 2.8*  --   --   --   PHOS 3.3 5.0* 4.0 3.9  --   --   --    GFR: Estimated Creatinine Clearance: 53.5 mL/min (by C-G formula based on SCr of 1.12 mg/dL). Recent Labs  Lab 06/13/23 0258 06/14/23 0251 06/14/23 1001 06/15/23 0257 06/16/23 0311  PROCALCITON  --   --  0.21  --   --   WBC 14.0* 18.2*  --  16.9* 16.5*    Liver Function Tests: Recent Labs  Lab 06/12/23 0535 06/13/23 0258 06/14/23 0251 06/15/23 0257 06/16/23 0311  AST 51* 60* 69* 105* 62*  ALT 33 45* 60* 100* 77*  ALKPHOS 372* 338* 351* 409* 408*  BILITOT 1.0 0.7 0.9 1.1 1.0  PROT 6.4* 5.8* 6.5 5.9* 6.1*  ALBUMIN 2.6* 2.4* 2.7* 2.5* 2.5*   No results for input(s): "LIPASE", "AMYLASE" in the last 168 hours.  No results for input(s): "AMMONIA" in the last 168 hours.  ABG    Component Value Date/Time   PHART 7.43 06/12/2023 1300   PCO2ART 38 06/12/2023 1300   PO2ART 57 (L) 06/12/2023 1300   HCO3 25.3 06/12/2023 1300   O2SAT 90.3 06/12/2023 1300      Coagulation Profile: No results for input(s): "INR", "PROTIME" in the last 168 hours.  Cardiac Enzymes: Recent Labs  Lab 06/14/23 1001  CKTOTAL 139    HbA1C: Hgb A1c MFr Bld  Date/Time Value Ref Range Status  06/12/2023 01:03 PM 7.9 (H) 4.8 - 5.6 % Final    Comment:    (NOTE) Pre diabetes:          5.7%-6.4%  Diabetes:              >6.4%  Glycemic control for   <7.0% adults with diabetes     CBG: Recent Labs  Lab 06/15/23 1950 06/15/23 2145 06/16/23 0757 06/16/23 1134 06/16/23 1523  GLUCAP 225* 238* 89 90 144*    Critical care time: NA     Melody Comas, MD [facility] Office: [phone]   See Amion for personal pager PCCM on call pager [phone] until 7pm. Please call Elink 7p-7a. [phone]

## 2023-06-16 NOTE — Progress Notes (Signed)
Patient O2 saturations dropped to the 70s/60s. Rn entered room to assess patient. Patient was dyspneic and stated, "I can't breathe." Color change of blue noted on face. RN found HHFNC catheter kinked and adjusted it to restore flow of O2. Patient recovered, O2 saturations currently 94% on HHFNC 31% 60L 100% FiO2. Patient skin tone is back to appropriate for ethnicity. Respiratory updated.

## 2023-06-16 NOTE — Plan of Care (Signed)
  Problem: Education: Goal: Knowledge of General Education information will improve Description: Including pain rating scale, medication(s)/side effects and non-pharmacologic comfort measures Outcome: Progressing   Problem: Clinical Measurements: Goal: Cardiovascular complication will be avoided Outcome: Progressing   Problem: Elimination: Goal: Will not experience complications related to urinary retention Outcome: Progressing   Problem: Clinical Measurements: Goal: Respiratory complications will improve Outcome: Not Progressing   Problem: Nutrition: Goal: Adequate nutrition will be maintained Outcome: Not Progressing   Problem: Elimination: Goal: Will not experience complications related to bowel motility Outcome: Not Progressing

## 2023-06-16 NOTE — Progress Notes (Signed)
Daily Progress Note   Patient Name: Corey [name]       Date: 06/16/2023 DOB: 12/01/1934  Age: 89 y.o. MRN#: [id number] Attending Physician: Corey Blue, MD Primary Care Physician: Corey Aspen, MD Admit Date: 06/07/2023 Length of Stay: 8 days  Reason for Consultation/Follow-up: Establishing goals of care  Subjective:   CC: Patient notes breathing does improve with morphine.  Following up regarding complex medical decision making.  Subjective:  Reviewed EMR prior to presenting to bedside.  At time of EMR in past 24 hours patient has received as needed oral morphine 5 mg concentrate x 4 doses. Discussed care with PCCM provider for medical updates.  Presented to bedside to meet with patient.  Patient laying in bed on high flow nasal cannula support.  Patient's brother and niece present at bedside.  Again introduced myself as a member of the palliative medicine team.  Patient noted that his breathing does improve with the morphine though he cannot determine exactly how long this improvement last.  Patient does feel that he gets short of breath at times and it takes a while for the medication to catch up.  Discussed scheduling the morphine oral solution every 4 hours to hopefully get continuous relief for shortness of breath.  Patient can always refuse medication.  Patient agreeing with this plan.  Offered emotional support be active listening.  Noted palliative medicine team to continue follow-up with patient's medical journey.  Objective:   Vital Signs:  BP (!) 136/55   Pulse 68   Temp 97.9 F (36.6 C) (Oral)   Resp 20   Ht 6\' 1"  (1.854 m)   Wt 89.3 kg   SpO2 93%   BMI 25.97 kg/m   Physical Exam: General: NAD, alert, laying in bed, chronically ill appearing  HENT: HFNC in place Cardiovascular: RRR Respiratory: increased work of breathing noted, on HFNC Neuro: A&Ox4, following commands easily Psych: appropriately answers all questions  Imaging: I personally  reviewed recent imaging.   Assessment & Plan:   Assessment: Patient is an 89 year old male with a past medical history of A-fib on Eliquis, BPH, and recently diagnosed stage IV metastatic neuroendocrine tumor with unknown primary who was admitted on 06/07/2023 for management of weakness and shortness of breath.  Patient had recently been started Afinitor for cancer management.  This was discontinued as patient was unable to tolerate in setting of significant fatigue and cough.  Patient was referred to Dr. Ty [name] for [facility] therapy though has been hospitalized and unable to attend this appointment.  During hospitalization patient has had worsening acute respiratory failure with noted hypoxia.  Pulmonology following to assist with management.  Oncology following in setting of metastatic neuroendocrine tumor.  Cardiology consulted for concerns of cardiogenic edema.  Palliative medicine team consulted to assist with complex medical decision making.   Recommendations/Plan: # Complex medical decision making/goals of care:     -Patient continues to receive appropriate medical interventions (HFNC, meds, etc) with hopes that management of fungal infection will improve his respiratory status.  Allowing time for outcomes.  Will continue discussions as able moving forward.    Code Status: Limited: Do not attempt resuscitation (DNR) -DNR-LIMITED -Do Not Intubate/DNI   # Symptom management Patient is receiving these palliative interventions for symptom management with an intent to improve quality of life.                 -Dyspnea, in setting of acute hypoxic respiratory failure possibly multifactorial   -Start oral morphine  solution 5 mg every 4 hours scheduled.  Patient can always refuse.  Hold for respiratory rate less than 12.                              -Continue morphine oral solution to 5 mg every 2 hours as needed   -Continue IV morphine 2 mg every 3 hours as needed for breakthrough management after  oral morphine or if patient on BiPAP overnight and unable to take oral medications                               -Continue morphine for inhalation 10 mg every 3 hours as needed.  Can use oral morphine and inhaled morphine since inhaled morphine does not cause systemic effects.                               -Monitor kidney function while receiving oral morphine   # Psycho-social/Spiritual Support:  - Support System: wife  # Discharge Planning: To Be Determined  Discussed with: Patient, PCCM, RN  Thank you for allowing the palliative care team to participate in the care Corey [name].  Corey Morin, DO Palliative Care Provider PMT # [phone]  If patient remains symptomatic despite maximum doses, please call PMT at [phone] between 0700 and 1900. Outside of these hours, please call attending, as PMT does not have night coverage.

## 2023-06-16 NOTE — Progress Notes (Signed)
   06/15/23 2259  BiPAP/CPAP/SIPAP  BiPAP/CPAP/SIPAP Pt Type Adult (Remains at bedside.)  BiPAP/CPAP/SIPAP V60   Pt. Is currently on Heated High Flow Nasal Cannula, tolerating well, BiPAP V60 remains in room.

## 2023-06-16 NOTE — Progress Notes (Signed)
Progress Note   Patient: Corey [name] ZHY:[id number] DOB: 12-May-1935 DOA: 06/07/2023     8 DOS: the patient was seen and examined on 06/16/2023   Brief hospital course: 89yo with h/o afib on Eliquis, BPH, and recently diagnosed (03/2023) stage IV metastatic neuroendocrine tumor with unknown primary who presented on 1/9 with weakness and SOB.  He was recently started on Afinitor but was unable to tolerate it due to significant fatigue and cough.  He was awaiting consultation with Dr. Amil Amen for Lutathera therapy but missed his appointment because of this hospitalization.  He saw his oncologist on 1/6 and CXR showed pneumonia.  He was then seen by his cardiologist on 1/7 and there was concern for heart failure so he was started on Lasix.  He had a good response to Lasix and his peripheral edema resolved however his cough and dyspnea continued.  Given this he presented to the ED and further workup showed evidence of progressive pulmonary consolidation.  He was started on azithromycin and Rocephin and repeat CXR showed "Interval worsening multifocal left lung pneumonia". Chest CT showed no evidence of PE but did show possible pulmonary edema.  He was given IV Solu-Medrol and a dose of IV Lasix and cardiology has been consulted.  Pulmonary added IV steroids 125 mg every 12 for 5 more doses.  Respiratory status worsened on 1/14 and he was transferred to the stepdown unit and placed on heated high flow nasal cannula 60 L.  Antibiotics escalated to cefepime and pulmonary added Bactrim.  Inflammatory markers being checked and cardiology give another dose of diuresis.    Assessment and Plan:  Acute Respiratory Failure with Hypoxia  Presented with acute respiratory failure with hypoxia and had a reported pulse oximetry of 85% on room air at home Ddx includes PNA, pneumonitis (had 1 dose of everolimus recently), cardiogenic edema Most likely etiology of his hypoxia due to community-acquired pneumonia; PE  was considered in the setting of his malignancy but is unlikely given that the patient is already anticoagulated Eliquis  He acutely decompensated on 1/14 and was transferred to SDU, is currently on 60L HFNC O2, FiO2 80% Repeat CXR on 1/15 showed persistent B airspace opacities, L > R, unchanged Pulmonary consulted Given Solumedrol -> prednisone and Lasix (now off) Also added Xopenex/Atrovent and also on Budesonide 0.25 mg twice daily Remains on HFNC O2 (unable to tolerate BIPAP) He remains SOB and having air hunger issues Morphine added by palliative care for air hunger Appears to be continuing to decline, overall poor prognosis at this time   Fungal Pneumonia Has had progressive cough, hypoxia, multilobar pneumonia seen on chest x-ray and has been progressive when compared to x-ray from 06/04/2023 No fever, leukocytosis, or other evidence of sepsis Completed Azithromycin/Ceftriaxone, s/p 1 dose of Vanc, started on Cefepime and Bactrim on 1/14 Started on Solumedrol 120 mg IV q12 x 3 days with plan for extended steroid taper with prednisone (60 mg starting 1/16) Incentive spirometer, and flutter valve and Guaifenesin Today's CXR with no interval change, persistent L > R airspace opacities Fungitell is positive, started on Micafungin   Acute on Chronic Diastolic CHF He had responded well to several days of p.o. Lasix, currently has no evidence of fluid overload. Holding Lasix at this time Cardiology is following Echocardiogram 1/9 with EF of 50-55% with grade 1 diastolic dysfunction    Bacteremia, factitious Patient grew Staph hominis in 1 culture but other culture negative and repeat cultures negative x 5 days Very likely  contaminant   Stage IV Neuroendocrine Tumor Dr. Mosetta Putt consulted on 1/13 and recommended continued antibiotics for now Started on Everolimus (Afinitor) on 12/18, stopped on 1/6 due to poor tolerance He is planning to start bimonthly infusions with Lutathera once  improved Will need outpatient follow-up with Dr. Mosetta Putt and Dr. Amil Amen as an outpatient once stable for dc Palliative care consulting   Paroxysmal Atrial Fibrillation Continue Flecainide  Continue Eliquis   Stage 3a CKD Baseline creatinine appears to be 1.1-1.3 Appears to be stable at this time Attempt to avoid nephrotoxic medications Recheck BMP in AM    Abnormal LFTs Worsening liver function RUQ [place] with extensive hepatic metastatic disease but no acute findings   Hypertension Hold valsartan/HCTZ If BP is uptrending, can resume hole amlodipine   Diabetes Mellitus Type 2 with Hyperglycemia  HbA1c was 7.9, at goal of <8 based on age Not on home meds Glucose significantly elevated from steroids Added Semglee 10 units + 4 units qAC + resistant-scale SSI with significant improvement   Depression and Anxiety Continue buproprion, buspirone, and mirtazepine   Normocytic Anemia Checked Anemia Panel showed an iron level of 30, UIBC 258, TIBC 288, saturation history of 10%, ferritin level of 84, folate of 4.9 and vitamin B12 level of 888 Started Folic Acid 1 mg po Daily  Appears to be relatively stable Continue to monitor   HLD Continue atorvastatin for now Monitor LFTs, as this may need to be help if liver function worsens   GERD/GI Prophylaxis Continue PPI    Severe malnutrition Nutrition Problem: Severe Malnutrition Etiology: chronic illness Signs/Symptoms: severe fat depletion, severe muscle depletion Interventions: Ensure Enlive (each supplement provides 350kcal and 20 grams of protein), MVI, Magic cup   DNR Code status changed after palliative consultation on 1/17        Consultants: Oncology Pulmonology Cardiology Palliative care [facility] team DM coordinator   Procedures: Echocardiogram 1/9   Antibiotics: Azithromycin 1/9-13 Ceftriaxone 1/9-14 Cefepime 1/14- Bactrim 1/14- Vancomycin x 1 Micafungin 1/16-  30 Day Unplanned Readmission Risk Score     Flowsheet Row ED to Hosp-Admission (Current) from 06/07/2023 in [facility]ICU/STEPDOWN  30 Day Unplanned Readmission Risk Score (%) 20.68 Filed at 06/16/2023 0400       This score is the patient's risk of an unplanned readmission within 30 days of being discharged (0 -100%). The score is based on dignosis, age, lab data, medications, orders, and past utilization.   Low:  0-14.9   Medium: 15-21.9   High: 22-29.9   Extreme: 30 and above          Overnight event:  HFNC O2 tubing kinked,  Sats dropped into 60-70s and patient turned blue. RN corrected O2 issue without difficult and patient returned to current baseline.  Subjective: Still having ongoing air hunger, was awaiting morphine at the time of my evaluation.   Objective: Vitals:   06/16/23 1408 06/16/23 1500  BP:  (!) 114/46  Pulse:  66  Resp:  (!) 22  Temp:    SpO2: 93% 94%    Intake/Output Summary (Last 24 hours) at 06/16/2023 1532 Last data filed at 06/16/2023 1200 Gross per 24 hour  Intake 445 ml  Output 1000 ml  Net -555 ml   Filed Weights   06/14/23 0500 06/15/23 0500 06/16/23 0500  Weight: 87.1 kg 87.1 kg 89.3 kg    Exam:  General:  Appears ill, mild respiratory distress despite on HFNC O2, +conversational dyspnea, appears more fatigued Eyes:  EOMI, normal  lids, iris ENT:  hard of hearing, grossly normal lips & tongue, mmm Neck:  no LAD, masses or thyromegaly Cardiovascular:  RRR, no m/r/g. No LE edema.  Respiratory:   Scattered coarse rhonchi.   Moderately increased respiratory effort on HFNC O2 Abdomen:  soft, NT, ND Skin:  no rash or induration seen on limited exam Musculoskeletal:  grossly normal tone BUE/BLE, good ROM, no bony abnormality Psychiatric:  grossly normal mood and affect, speech fluent and appropriate, AOx3 Neurologic:  CN 2-12 grossly intact, moves all extremities in coordinated fashion  Data Reviewed: I have reviewed the patient's lab results since admission.   Pertinent labs for today include:   Na++ 132 Glucose 127 BUN 48 AP 408 Albumin 2.5 AST 62/ALT 77 WBC 16.5    Family Communication: None present  Disposition: Status is: Inpatient Remains inpatient appropriate because: critically ill  Total critical care time: 45 minutes Critical care time was exclusive of separately billable procedures and treating other patients. Critical care was necessary to treat or prevent imminent or life-threatening deterioration. Critical care was time spent personally by me on the following activities: development of treatment plan with patient and/or surrogate as well as nursing, discussions with consultants, evaluation of patient's response to treatment, examination of patient, obtaining history from patient or surrogate, ordering and performing treatments and interventions, ordering and review of laboratory studies, ordering and review of radiographic studies, pulse oximetry and re-evaluation of patient's condition.   Unresulted Labs (From admission, onward)     Start     Ordered   06/17/23 0500  Procalcitonin  Tomorrow morning,   R       References:    Procalcitonin Lower Respiratory Tract Infection AND Sepsis Procalcitonin Algorithm  Question:  Specimen collection method  Answer:  Lab=Lab collect   06/16/23 1532   06/15/23 0931  Aspergillus antibody by immunodiff  Once,   R       Question:  Specimen collection method  Answer:  Lab=Lab collect   06/15/23 0930   06/15/23 0500  CBC with Differential/Platelet  Daily,   R     Question:  Specimen collection method  Answer:  Lab=Lab collect   06/14/23 1313   06/15/23 0500  Comprehensive metabolic panel  Daily,   R     Question:  Specimen collection method  Answer:  Lab=Lab collect   06/14/23 1313   06/12/23 0923  Hypersensitivity Pneumonitis  Once,   R       Question:  Specimen collection method  Answer:  Lab=Lab collect   06/12/23 6578             Author: Jonah Blue, MD 06/16/2023 3:32  PM  For on call review [email or web address].

## 2023-06-17 ENCOUNTER — Inpatient Hospital Stay (HOSPITAL_COMMUNITY): Payer: Medicare Other

## 2023-06-17 DIAGNOSIS — Z7189 Other specified counseling: Secondary | ICD-10-CM | POA: Diagnosis not present

## 2023-06-17 DIAGNOSIS — J168 Pneumonia due to other specified infectious organisms: Secondary | ICD-10-CM | POA: Diagnosis not present

## 2023-06-17 DIAGNOSIS — Z515 Encounter for palliative care: Secondary | ICD-10-CM | POA: Diagnosis not present

## 2023-06-17 DIAGNOSIS — B49 Unspecified mycosis: Secondary | ICD-10-CM | POA: Diagnosis not present

## 2023-06-17 DIAGNOSIS — J9601 Acute respiratory failure with hypoxia: Secondary | ICD-10-CM | POA: Diagnosis not present

## 2023-06-17 LAB — COMPREHENSIVE METABOLIC PANEL
ALT: 64 U/L — ABNORMAL HIGH (ref 0–44)
AST: 52 U/L — ABNORMAL HIGH (ref 15–41)
Albumin: 2.6 g/dL — ABNORMAL LOW (ref 3.5–5.0)
Alkaline Phosphatase: 405 U/L — ABNORMAL HIGH (ref 38–126)
Anion gap: 9 (ref 5–15)
BUN: 48 mg/dL — ABNORMAL HIGH (ref 8–23)
CO2: 26 mmol/L (ref 22–32)
Calcium: 9 mg/dL (ref 8.9–10.3)
Chloride: 97 mmol/L — ABNORMAL LOW (ref 98–111)
Creatinine, Ser: 1.25 mg/dL — ABNORMAL HIGH (ref 0.61–1.24)
GFR, Estimated: 56 mL/min — ABNORMAL LOW (ref >60)
Glucose, Bld: 90 mg/dL (ref 70–99)
Potassium: 5.8 mmol/L — ABNORMAL HIGH (ref 3.5–5.1)
Sodium: 132 mmol/L — ABNORMAL LOW (ref 135–145)
Total Bilirubin: 0.9 mg/dL (ref 0.0–1.2)
Total Protein: 6.2 g/dL — ABNORMAL LOW (ref 6.5–8.1)

## 2023-06-17 LAB — GLUCOSE, CAPILLARY
Glucose-Capillary: 60 mg/dL — ABNORMAL LOW (ref 70–99)
Glucose-Capillary: 66 mg/dL — ABNORMAL LOW (ref 70–99)
Glucose-Capillary: 74 mg/dL (ref 70–99)
Glucose-Capillary: 110 mg/dL — ABNORMAL HIGH (ref 70–99)
Glucose-Capillary: 113 mg/dL — ABNORMAL HIGH (ref 70–99)
Glucose-Capillary: 148 mg/dL — ABNORMAL HIGH (ref 70–99)

## 2023-06-17 LAB — CBC WITH DIFFERENTIAL/PLATELET
Abs Immature Granulocytes: 0.2 10*3/uL — ABNORMAL HIGH (ref 0.00–0.07)
Basophils Absolute: 0.1 10*3/uL (ref 0.0–0.1)
Basophils Relative: 1 %
Eosinophils Absolute: 0.2 10*3/uL (ref 0.0–0.5)
Eosinophils Relative: 1 %
HCT: 39.2 % (ref 39.0–52.0)
Hemoglobin: 12.5 g/dL — ABNORMAL LOW (ref 13.0–17.0)
Immature Granulocytes: 1 %
Lymphocytes Relative: 3 %
Lymphs Abs: 0.5 10*3/uL — ABNORMAL LOW (ref 0.7–4.0)
MCH: 28.9 pg (ref 26.0–34.0)
MCHC: 31.9 g/dL (ref 30.0–36.0)
MCV: 90.5 fL (ref 80.0–100.0)
Monocytes Absolute: 1.7 10*3/uL — ABNORMAL HIGH (ref 0.1–1.0)
Monocytes Relative: 10 %
Neutro Abs: 14.7 10*3/uL — ABNORMAL HIGH (ref 1.7–7.7)
Neutrophils Relative %: 84 %
Platelets: 409 10*3/uL — ABNORMAL HIGH (ref 150–400)
RBC: 4.33 MIL/uL (ref 4.22–5.81)
RDW: 14.6 % (ref 11.5–15.5)
WBC: 17.4 10*3/uL — ABNORMAL HIGH (ref 4.0–10.5)
nRBC: 0 % (ref 0.0–0.2)

## 2023-06-17 LAB — PROCALCITONIN: Procalcitonin: 0.19 ng/mL

## 2023-06-17 MED ORDER — DEXTROSE-SODIUM CHLORIDE 5-0.9 % IV SOLN: INTRAVENOUS | Status: AC

## 2023-06-17 MED ORDER — DEXMEDETOMIDINE HCL IN NACL 200 MCG/50ML IV SOLN
0.0000 ug/kg/h | INTRAVENOUS | Status: DC
  Administered 2023-06-17: 0.5 ug/kg/h via INTRAVENOUS
  Administered 2023-06-17: 0.4 ug/kg/h via INTRAVENOUS
  Administered 2023-06-17: 0.5 ug/kg/h via INTRAVENOUS
  Filled 2023-06-17 (×3): qty 50

## 2023-06-17 MED ORDER — SENNOSIDES-DOCUSATE SODIUM 8.6-50 MG PO TABS
1.0000 | ORAL_TABLET | Freq: Once | ORAL | Status: AC
  Administered 2023-06-17: 1 via ORAL
  Filled 2023-06-17: qty 1

## 2023-06-17 MED ORDER — BISACODYL 10 MG RE SUPP
10.0000 mg | Freq: Once | RECTAL | Status: AC
  Administered 2023-06-17: 10 mg via RECTAL
  Filled 2023-06-17: qty 1

## 2023-06-17 MED ORDER — SODIUM ZIRCONIUM CYCLOSILICATE 5 G PO PACK
5.0000 g | PACK | Freq: Once | ORAL | Status: AC
Start: 2023-06-17 — End: 2023-06-17
  Administered 2023-06-17: 5 g via ORAL
  Filled 2023-06-17: qty 1

## 2023-06-17 MED ORDER — SODIUM CHLORIDE 0.9 % IV SOLN: INTRAVENOUS | Status: DC

## 2023-06-17 MED ORDER — METHYLNALTREXONE BROMIDE 12 MG/0.6ML ~~LOC~~ SOLN
12.0000 mg | Freq: Once | SUBCUTANEOUS | Status: AC
  Administered 2023-06-17: 12 mg via SUBCUTANEOUS
  Filled 2023-06-17: qty 0.6

## 2023-06-17 MED ORDER — MORPHINE SULFATE (CONCENTRATE) 10 MG /0.5 ML PO SOLN
7.5000 mg | ORAL | Status: DC
  Administered 2023-06-17 – 2023-06-19 (×14): 7.6 mg via ORAL
  Filled 2023-06-17 (×14): qty 0.5

## 2023-06-17 NOTE — Plan of Care (Signed)
  Problem: Clinical Measurements: Goal: Respiratory complications will improve Outcome: Not Progressing Goal: Cardiovascular complication will be avoided Outcome: Not Progressing   Problem: Nutrition: Goal: Adequate nutrition will be maintained Outcome: Not Progressing

## 2023-06-17 NOTE — Progress Notes (Signed)
Progress Note   Patient: Corey [name] UEA:[id number] DOB: Aug 30, 1934 DOA: 06/07/2023     9 DOS: the patient was seen and examined on 06/17/2023   Brief hospital course: 89yo with h/o afib on Eliquis, BPH, and recently diagnosed (03/2023) stage IV metastatic neuroendocrine tumor with unknown primary who presented on 1/9 with weakness and SOB.  He was recently started on Afinitor but was unable to tolerate it due to significant fatigue and cough.  He was awaiting consultation with Dr. Amil Amen for Lutathera therapy but missed his appointment because of this hospitalization.  He saw his oncologist on 1/6 and CXR showed pneumonia.  He was then seen by his cardiologist on 1/7 and there was concern for heart failure so he was started on Lasix.  He had a good response to Lasix and his peripheral edema resolved however his cough and dyspnea continued.  Given this he presented to the ED and further workup showed evidence of progressive pulmonary consolidation.  He was started on azithromycin and Rocephin and repeat CXR showed "Interval worsening multifocal left lung pneumonia". Chest CT showed no evidence of PE but did show possible pulmonary edema.  He was given IV Solu-Medrol and a dose of IV Lasix and cardiology has been consulted.  Pulmonary added IV steroids 125 mg every 12 for 5 more doses.  Respiratory status worsened on 1/14 and he was transferred to the stepdown unit and placed on heated high flow nasal cannula 60 L.  Antibiotics escalated to cefepime and pulmonary added Bactrim.  Inflammatory markers being checked and cardiology give another dose of diuresis.    Assessment and Plan:  Acute Respiratory Failure with Hypoxia  Presented with acute respiratory failure with hypoxia and had a reported pulse oximetry of 85% on room air at home Ddx includes PNA, pneumonitis (had 1 dose of everolimus recently), cardiogenic edema Most likely etiology of his hypoxia due to community-acquired pneumonia; PE  was considered in the setting of his malignancy but is unlikely given that the patient is already anticoagulated Eliquis  He acutely decompensated on 1/14 and was transferred to SDU, is currently on 60L HFNC O2, FiO2 80% Repeat CXR on 1/15 showed persistent B airspace opacities, L > R, unchanged Pulmonary consulted Given Solumedrol -> prednisone and Lasix (now off) Also added Xopenex/Atrovent and also on Budesonide 0.25 mg twice daily Remains on HFNC O2 (unable to tolerate BIPAP) He remains SOB and having air hunger issues Morphine added by palliative care for air hunger Appears to be continuing to decline, overall poor prognosis at this time   Fungal Pneumonia Has had progressive cough, hypoxia, multilobar pneumonia seen on chest x-ray and has been progressive when compared to x-ray from 06/04/2023 No fever, leukocytosis, or other evidence of sepsis Completed Azithromycin/Ceftriaxone, s/p 1 dose of Vanc, started on Cefepime and Bactrim on 1/14 Started on Solumedrol 120 mg IV q12 x 3 days with plan for extended steroid taper with prednisone (60 mg starting 1/16) Incentive spirometer, and flutter valve and Guaifenesin Today's CXR with no interval change, persistent L > R airspace opacities Fungitell is positive, started on Micafungin   Acute on Chronic Diastolic CHF He had responded well to several days of p.o. Lasix, currently has no evidence of fluid overload. Holding Lasix at this time Cardiology has been following but will sign off for now Echocardiogram 1/9 with EF of 50-55% with grade 1 diastolic dysfunction    Bacteremia, factitious Patient grew Staph hominis in 1 culture but other culture negative and repeat  cultures negative x 5 days Very likely contaminant   Stage IV Neuroendocrine Tumor Dr. Mosetta Putt consulted on 1/13 and recommended continued antibiotics for now Started on Everolimus (Afinitor) on 12/18, stopped on 1/6 due to poor tolerance He is planning to start bimonthly  infusions with Lutathera once improved Will need outpatient follow-up with Dr. Mosetta Putt and Dr. Amil Amen as an outpatient once stable for dc Palliative care consulting   Paroxysmal Atrial Fibrillation Continue Flecainide  Continue Eliquis   Stage 3a CKD Baseline creatinine appears to be 1.1-1.3 Appears to be stable at this time Attempt to avoid nephrotoxic medications Recheck BMP in AM    Abnormal LFTs Worsening liver function RUQ [place] with extensive hepatic metastatic disease but no acute findings   Hypertension Hold valsartan/HCTZ If BP is uptrending, can resume hole amlodipine   Diabetes Mellitus Type 2 with Hyperglycemia  HbA1c was 7.9, at goal of <8 based on age Not on home meds Glucose significantly elevated from steroids Added Semglee 10 units + 4 units qAC + resistant-scale SSI with significant improvement   Depression and Anxiety Continue buproprion, buspirone, and mirtazepine   Normocytic Anemia Checked Anemia Panel showed an iron level of 30, UIBC 258, TIBC 288, saturation history of 10%, ferritin level of 84, folate of 4.9 and vitamin B12 level of 888 Started Folic Acid 1 mg po Daily  Appears to be relatively stable Continue to monitor   HLD Continue atorvastatin for now Monitor LFTs, as this may need to be help if liver function worsens   GERD/GI Prophylaxis Continue PPI    Severe malnutrition Nutrition Problem: Severe Malnutrition Etiology: chronic illness Signs/Symptoms: severe fat depletion, severe muscle depletion Interventions: Ensure Enlive (each supplement provides 350kcal and 20 grams of protein), MVI, Magic cup   DNR/GOC Code status changed after palliative consultation on 1/17  Patient has had continued daily deterioration His wife is adamant that we continue to aggressively treat him, but the likelihood for recovery that will be meaningful enough for him to be able to complete cancer treatment is increasingly low Transition to comfort care would  be reasonable Family is considering, his son is currently en route from [place], [place] Methylnaltrexone and Precedex were added today for comfort       Consultants: Oncology Pulmonology Cardiology Palliative care [facility] team DM coordinator   Procedures: Echocardiogram 1/9   Antibiotics: Azithromycin 1/9-13 Ceftriaxone 1/9-14 Cefepime 1/14- Bactrim 1/14- Vancomycin x 1 Micafungin 1/16-  30 Day Unplanned Readmission Risk Score    Flowsheet Row ED to Hosp-Admission (Current) from 06/07/2023 in [facility]-ICU/STEPDOWN  30 Day Unplanned Readmission Risk Score (%) 21.18 Filed at 06/17/2023 0401       This score is the patient's risk of an unplanned readmission within 30 days of being discharged (0 -100%). The score is based on dignosis, age, lab data, medications, orders, and past utilization.   Low:  0-14.9   Medium: 15-21.9   High: 22-29.9   Extreme: 30 and above           Subjective: Increasingly weak, frail, uncomfortable in appearance.   Objective: Vitals:   06/17/23 0300 06/17/23 0400  BP:    Pulse:    Resp:    Temp:  97.9 F (36.6 C)  SpO2: 94%     Intake/Output Summary (Last 24 hours) at 06/17/2023 0728 Last data filed at 06/17/2023 0600 Gross per 24 hour  Intake 565 ml  Output 1600 ml  Net -1035 ml   Filed Weights   06/15/23  0500 06/16/23 0500 06/17/23 0500  Weight: 87.1 kg 89.3 kg 87.5 kg    Exam:  General:  Appears ill, increasing respiratory distress despite on HFNC O2, +conversational dyspnea, appears more fatigued and moving towards dying Eyes:  EOMI, normal lids, iris ENT:  hard of hearing, grossly normal lips & tongue, mmm Neck:  no LAD, masses or thyromegaly Cardiovascular:  RRR, no m/r/g. No LE edema.  Respiratory:   Scattered coarse rhonchi.   Moderately increased respiratory effort on HFNC O2 Abdomen:  soft, NT, +distention Skin:  no rash or induration seen on limited exam Musculoskeletal:  grossly normal tone  BUE/BLE, good ROM, no bony abnormality Psychiatric:  grossly normal mood and affect, speech fluent and appropriate, AOx3 Neurologic:  CN 2-12 grossly intact, moves all extremities in coordinated fashion  Data Reviewed: I have reviewed the patient's lab results since admission.  Pertinent labs for today include:   Na++ 132 K+ 5.8 BUN 48/Creatinine 1.25/GFR 56 AP 405 Albumin 2.6 AST 52/ALT 64 Procalcitonin 0.19 WBC 17.4 Hgb 12.5 Platelets 409     Family Communication: Many family members were present at the bedside  Disposition: Status is: Inpatient Remains inpatient appropriate because: critically ill  Total critical care time: 50 minutes Critical care time was exclusive of separately billable procedures and treating other patients. Critical care was necessary to treat or prevent imminent or life-threatening deterioration. Critical care was time spent personally by me on the following activities: development of treatment plan with patient and/or surrogate as well as nursing, discussions with consultants, evaluation of patient's response to treatment, examination of patient, obtaining history from patient or surrogate, ordering and performing treatments and interventions, ordering and review of laboratory studies, ordering and review of radiographic studies, pulse oximetry and re-evaluation of patient's condition.   Unresulted Labs (From admission, onward)     Start     Ordered   06/15/23 0931  Aspergillus antibody by immunodiff  Once,   R       Question:  Specimen collection method  Answer:  Lab=Lab collect   06/15/23 0930   06/15/23 0500  CBC with Differential/Platelet  Daily,   R     Question:  Specimen collection method  Answer:  Lab=Lab collect   06/14/23 1313   06/15/23 0500  Comprehensive metabolic panel  Daily,   R     Question:  Specimen collection method  Answer:  Lab=Lab collect   06/14/23 1313   06/12/23 0923  Hypersensitivity Pneumonitis  Once,   R        Question:  Specimen collection method  Answer:  Lab=Lab collect   06/12/23 9604             Author: Jonah Blue, MD 06/17/2023 7:28 AM  For on call review [email or web address].

## 2023-06-17 NOTE — Progress Notes (Signed)
Daily Progress Note   Patient Name: Corey [name]       Date: 06/17/2023 DOB: [birth year]  Age: 89 y.o. MRN#: [id number] Attending Physician: Jonah Blue, MD Primary Care Physician: Emilio Aspen, MD Admit Date: 06/07/2023 Length of Stay: 9 days  Reason for Consultation/Follow-up: Establishing goals of care  Subjective:   CC: Patient's work of breathing increased today.  Following up regarding complex medical decision making.  Subjective:  Reviewed EMR prior to presenting to bedside.  Patient has been receiving oral morphine scheduled 5 mg every 4 hours.  Patient also required as needed dose of IV morphine 2 mg x 1 dose and as needed oral solution 5 mg x 3 doses. Discussed care with IDT including PCCM provider and RN prior to meeting with patient.  Going to check KUB for possible constipation which could be worsening breathing  Presented to bedside to meet with patient.  Patient laying in bed on high flow nasal cannula with visible increased work of breathing.  Patient's brother at bedside.  Reviewed can increase scheduled oral morphine solution today.  Worry is that patient is continuing to use up his energy just to breathe.  Patient agreeing with plan to increase oral morphine scheduled.  Also discussed that should this not improve patient's work of breathing, may need to start continuous IV morphine infusion.  With permission, expressed concern that if that is needed, patient will be asleep and unable to interact with family.  Patient acknowledges.  Patient discussed how being asleep at this point is more comfortable than working as hard to breathe.  Spent time offering support as able.  Discussed care with PCCM provider after visit.  Going to add Precedex as well to hopefully allow patient to rest to decrease work of breathing.  Represented to bedside at request of family.  Wife, niece, brother, and cousin wanted to be updated outside of room.  Again expressed plan for  today.  Discussed addition of Precedex as well after discussion with PCCM provider.  Explained patient's worsening work of breathing and concerns related to this.  Wife inquired "are we giving up?".  Emphasized that medical team is doing "everything they can that is medically appropriate" to help support the patient during this difficult time.  Did explain that patient may sleep more with medications to allow his body to rest to allow time for antifungals to work.  Also expressed concern that while the medical team is continuing everything appropriate for his infection, this is all likely related to his underlying metastatic cancer.  His metastatic neuroendocrine cancer likely caused him to become immunocompromised.  "Fixing" his lungs does not fix the cancer.  Family acknowledged this.  Wife appropriately tearful.  Wife expressing that she cannot imagine her life without him.  Spent time providing emotional support via active listening.  Noted palliative medicine team to continue to follow along with patient's medical journey.  Updated IDT regarding discussion.  Objective:   Vital Signs:  BP (!) 120/40   Pulse 74   Temp (!) 96.3 F (35.7 C) (Oral)   Resp (!) 34   Ht 6\' 1"  (1.854 m)   Wt 87.5 kg   SpO2 98%   BMI 25.45 kg/m   Physical Exam: General: NAD, alert, laying in bed, chronically ill appearing  HENT: HFNC in place Cardiovascular: RRR Respiratory: increased work of breathing noted, on HFNC Neuro: A&Ox4, following commands easily Psych: appropriately answers all questions  Imaging: I personally reviewed recent imaging.   Assessment &  Plan:   Assessment: Patient is an 89 year old male with a past medical history of A-fib on Eliquis, BPH, and recently diagnosed stage IV metastatic neuroendocrine tumor with unknown primary who was admitted on 06/07/2023 for management of weakness and shortness of breath.  Patient had recently been started Afinitor for cancer management.  This was  discontinued as patient was unable to tolerate in setting of significant fatigue and cough.  Patient was referred to Dr. Ty Hilts for [facility] therapy though has been hospitalized and unable to attend this appointment.  During hospitalization patient has had worsening acute respiratory failure with noted hypoxia.  Pulmonology following to assist with management.  Oncology following in setting of metastatic neuroendocrine tumor.  Cardiology consulted for concerns of cardiogenic edema.  Palliative medicine team consulted to assist with complex medical decision making.   Recommendations/Plan: # Complex medical decision making/goals of care:     -Patient continues to receive appropriate medical interventions (HFNC, meds, etc) with hopes that management of fungal infection will improve his respiratory status.  Allowing time for outcomes.  Will continue discussions as able moving forward.  Updated patient and family as detailed above in HPI today.    Code Status: Limited: Do not attempt resuscitation (DNR) -DNR-LIMITED -Do Not Intubate/DNI   # Symptom management Patient is receiving these palliative interventions for symptom management with an intent to improve quality of life.                 -Dyspnea, in setting of acute hypoxic respiratory failure possibly multifactorial   -Increase oral morphine solution to 7.5 mg every 4 hours scheduled.  Patient can always refuse.  Hold for respiratory rate less than 12.                              -Continue morphine oral solution to 5 mg every 2 hours as needed   -Continue IV morphine 2 mg every 3 hours as needed for breakthrough management after oral morphine or if patient on BiPAP overnight and unable to take oral medications                               -Continue morphine for inhalation 10 mg every 3 hours as needed.  Can use oral morphine and inhaled morphine since inhaled morphine does not cause systemic effects.   -Discussed with PCCM provider and Precedex  being added as well.                               -Monitor kidney function while receiving oral morphine   # Psycho-social/Spiritual Support:  - Support System: wife, brother, niece, cousin  # Discharge Planning: To Be Determined  Discussed with: Patient, PCCM, RN, hospitalist, pharmacy, patient's family including wife and brother and niece  Thank you for allowing the palliative care team to participate in the care [name].  Alvester Morin, DO Palliative Care Provider PMT # [phone]  If patient remains symptomatic despite maximum doses, please call PMT at [phone] between 0700 and 1900. Outside of these hours, please call attending, as PMT does not have night coverage.  Personally spent 52 minutes in patient care including extensive chart review (labs, imaging, progress/consult notes, vital signs), medically appropraite exam, discussed with treatment team, education to patient, family, and staff, documenting clinical information, medication review and management, coordination  of care, and available advanced directive documents.

## 2023-06-17 NOTE — Progress Notes (Signed)
NAME:  Corey [name], MRN:  [id number], DOB:  [birth year], LOS: 9 ADMISSION DATE:  06/07/2023, CONSULTATION DATE: 06/10/2023 REFERRING MD: Dr. Marland Mcalpine, CHIEF COMPLAINT: Acute hypoxemic respiratory failure  History of Present Illness:  89 year old man with a history of former tobacco, newly diagnosed metastatic neuroendocrine tumor followed by Dr. Mosetta Putt.  Also with atrial fibrillation (now NSR), hypertension, CAD with a reassuring cardiac stress test 2023.  EF 51%.  For his neuroendocrine tumor he has been treated initially with Lutathera (stopped for transaminitis) and then everolimus for 3 weeks (stopped 1/6).  At that visit 1/6 he was found to be hypoxemic and had patchy bilateral pulmonary infiltrates on chest x-ray.  Subsequently seen by Dr. Rennis Golden with cardiology and started on furosemide for possible component of pulmonary edema.  Now admitted 06/07/2023 with progressive dyspnea and cough.  He was started on azithromycin and ceftriaxone for possible community-acquired pneumonia.  Chest x-ray at the time of admission shows progression of left-sided hazy pulmonary infiltrates.  Pertinent  Medical History   Past Medical History:  Diagnosis Date   Basal cell carcinoma of auricle of right ear    BPH (benign prostatic hyperplasia)    DJD (degenerative joint disease)    left   ED (erectile dysfunction)    Fatigue    Hearing loss, sensorineural    Hx of adenomatous colonic polyps    Hx of major depression    Hypertension    Iron deficiency anemia    Nephrolithiasis    Paroxysmal atrial fibrillation (HCC)    Seasonal allergic rhinitis   Metastatic neuroendocrine tumor Atrial fibrillation   Significant Hospital Events: Including procedures, antibiotic start and stop dates in addition to other pertinent events   1/14 increase in O2 requirements overnight, transferred to step down unit on [facility] 1/15 on HHFNC 60L 80% FiO2 1/16 on HHFNC 55L 90% FiO2, tried bipap overnight 1/17 remained on  HHFNC 60L 90% FiO2  Interim History / Subjective:   Weaned to 80% FiO2 today Patient is struggling to breath, feels more short of breath Chest x-ray with stable lung infiltrates Enlarged gastric bubble and bowel gas pattern noted pushing up left hemidiaphragm.   Objective   Blood pressure (!) 120/40, pulse 74, temperature (!) 96.3 F (35.7 C), temperature source Oral, resp. rate (!) 34, height 6\' 1"  (1.854 m), weight 87.5 kg, SpO2 98%.    FiO2 (%):  [90 %-95 %] 90 %   Intake/Output Summary (Last 24 hours) at 06/17/2023 0851 Last data filed at 06/17/2023 0745 Gross per 24 hour  Intake 665 ml  Output 1600 ml  Net -935 ml   Filed Weights   06/15/23 0500 06/16/23 0500 06/17/23 0500  Weight: 87.1 kg 89.3 kg 87.5 kg    Examination: General: Elderly man laying in bed in mild distress on HHFNC HENT: Oropharynx clear, strong voice, no secretions, no stridor Lungs: bronchial breath sounds, scattered rales, no wheezing, increased work of breathing with accessory muscles Cardiovascular: Regular, distant, no murmur Abdomen: distended with positive bowel sounds Extremities: No peripheral edema Neuro: Awake, alert, interacting appropriately, follows commands GU: Deferred  Resolved Hospital Problem list     Assessment & Plan:  Acute hypoxemic respiratory failure with pulmonary infiltrates.   Differential diagnosis includes infectious etiology (bacterial, fungal vs atypical) vs pneumonitis reaction to recent everolimus vs pulmonary lymphangitic carcinomatosis from his neuroendoocrine disease. -Continue cefepime for 7 day course  - Continue bactrim, will determine length of treatment in coming days - LDH elevated, started on empiric  bactrim for pneumocystis coverage 1/14 - Fungitell positive, mycafungin started on 1/16 - MRSA screen negative - Completed 3 days of solumedrol 125mg  BID. - Continue 60mg  prednisone daily today.  - Autoimmune panel is negative -  Avoiding bronchoscopy as he  will be vent dependent after procedure - continue HHFNC, wean FiO2 for goal saturations 92% or higher - palliative following, morphine PRN - Difficulty breathing with gastric distension and bowel gas pattern causing elevating of left hemidiaphragm. Will give methylnaltrexone today given his narcotics for comfort. - Add precedex for comfort today  Blood culture Staph hominis 1 of 4 -Unclear significance, suspect contaminant -Currently covered with ceftriaxone  Hypertension Paroxysmal atrial fibrillation HFpEF -cardiology following   Metastatic neuroendocrine tumor -Appreciate Dr. Latanya Maudlin assistance  Hyponatremia Hypokalemia    Labs   CBC: Recent Labs  Lab 06/13/23 0258 06/14/23 0251 06/15/23 0257 06/16/23 0311 06/17/23 0256  WBC 14.0* 18.2* 16.9* 16.5* 17.4*  NEUTROABS 12.2* 15.0* 14.4* 14.2* 14.7*  HGB 11.7* 12.6* 12.1* 12.2* 12.5*  HCT 35.8* 38.6* 38.2* 38.1* 39.2  MCV 88.0 88.1 89.3 89.4 90.5  PLT 313 369 328 352 409*    Basic Metabolic Panel: Recent Labs  Lab 06/11/23 0512 06/12/23 0535 06/13/23 0258 06/14/23 0251 06/15/23 0257 06/16/23 0311 06/17/23 0256  NA 137 132* 136 135 136 132* 132*  K 4.1 4.0 3.8 3.7 4.3 4.9 5.8*  CL 99 96* 101 99 101 98 97*  CO2 24 25 22 25 24 24 26   GLUCOSE 262* 351* 128* 94 115* 127* 90  BUN 33* 44* 50* 58* 52* 48* 48*  CREATININE 1.29* 1.16 1.31* 1.47* 1.36* 1.12 1.25*  CALCIUM 8.8* 8.7* 8.7* 8.8* 8.9 9.0 9.0  MG 2.5* 2.8* 2.8*  --   --   --   --   PHOS 5.0* 4.0 3.9  --   --   --   --    GFR: Estimated Creatinine Clearance: 47.9 mL/min (A) (by C-G formula based on SCr of 1.25 mg/dL (H)). Recent Labs  Lab 06/14/23 0251 06/14/23 1001 06/15/23 0257 06/16/23 0311 06/17/23 0256  PROCALCITON  --  0.21  --   --  0.19  WBC 18.2*  --  16.9* 16.5* 17.4*    Liver Function Tests: Recent Labs  Lab 06/13/23 0258 06/14/23 0251 06/15/23 0257 06/16/23 0311 06/17/23 0256  AST 60* 69* 105* 62* 52*  ALT 45* 60* 100* 77*  64*  ALKPHOS 338* 351* 409* 408* 405*  BILITOT 0.7 0.9 1.1 1.0 0.9  PROT 5.8* 6.5 5.9* 6.1* 6.2*  ALBUMIN 2.4* 2.7* 2.5* 2.5* 2.6*   No results for input(s): "LIPASE", "AMYLASE" in the last 168 hours.  No results for input(s): "AMMONIA" in the last 168 hours.  ABG    Component Value Date/Time   PHART 7.43 06/12/2023 1300   PCO2ART 38 06/12/2023 1300   PO2ART 57 (L) 06/12/2023 1300   HCO3 25.3 06/12/2023 1300   O2SAT 90.3 06/12/2023 1300     Coagulation Profile: No results for input(s): "INR", "PROTIME" in the last 168 hours.  Cardiac Enzymes: Recent Labs  Lab 06/14/23 1001  CKTOTAL 139    HbA1C: Hgb A1c MFr Bld  Date/Time Value Ref Range Status  06/12/2023 01:03 PM 7.9 (H) 4.8 - 5.6 % Final    Comment:    (NOTE) Pre diabetes:          5.7%-6.4%  Diabetes:              >6.4%  Glycemic control for   <  7.0% adults with diabetes     CBG: Recent Labs  Lab 06/16/23 0757 06/16/23 1134 06/16/23 1523 06/16/23 2149 06/17/23 0748  GLUCAP 89 90 144* 146* 60*    Critical care time: NA     Melody Comas, MD [facility] Office: [phone]   See Amion for personal pager PCCM on call pager [phone] until 7pm. Please call Elink 7p-7a. [phone]

## 2023-06-18 DIAGNOSIS — D3A8 Other benign neuroendocrine tumors: Secondary | ICD-10-CM | POA: Diagnosis not present

## 2023-06-18 DIAGNOSIS — Z515 Encounter for palliative care: Secondary | ICD-10-CM | POA: Diagnosis not present

## 2023-06-18 DIAGNOSIS — J168 Pneumonia due to other specified infectious organisms: Secondary | ICD-10-CM | POA: Diagnosis not present

## 2023-06-18 DIAGNOSIS — R4589 Other symptoms and signs involving emotional state: Secondary | ICD-10-CM | POA: Diagnosis not present

## 2023-06-18 DIAGNOSIS — B49 Unspecified mycosis: Secondary | ICD-10-CM | POA: Diagnosis not present

## 2023-06-18 DIAGNOSIS — E43 Unspecified severe protein-calorie malnutrition: Secondary | ICD-10-CM | POA: Diagnosis not present

## 2023-06-18 LAB — CBC WITH DIFFERENTIAL/PLATELET
Abs Immature Granulocytes: 0.2 10*3/uL — ABNORMAL HIGH (ref 0.00–0.07)
Basophils Absolute: 0.1 10*3/uL (ref 0.0–0.1)
Basophils Relative: 1 %
Eosinophils Absolute: 0.3 10*3/uL (ref 0.0–0.5)
Eosinophils Relative: 2 %
HCT: 39 % (ref 39.0–52.0)
Hemoglobin: 12.2 g/dL — ABNORMAL LOW (ref 13.0–17.0)
Immature Granulocytes: 1 %
Lymphocytes Relative: 3 %
Lymphs Abs: 0.4 10*3/uL — ABNORMAL LOW (ref 0.7–4.0)
MCH: 28.7 pg (ref 26.0–34.0)
MCHC: 31.3 g/dL (ref 30.0–36.0)
MCV: 91.8 fL (ref 80.0–100.0)
Monocytes Absolute: 1.2 10*3/uL — ABNORMAL HIGH (ref 0.1–1.0)
Monocytes Relative: 8 %
Neutro Abs: 12.1 10*3/uL — ABNORMAL HIGH (ref 1.7–7.7)
Neutrophils Relative %: 85 %
Platelets: 396 10*3/uL (ref 150–400)
RBC: 4.25 MIL/uL (ref 4.22–5.81)
RDW: 14.7 % (ref 11.5–15.5)
WBC: 14.2 10*3/uL — ABNORMAL HIGH (ref 4.0–10.5)
nRBC: 0 % (ref 0.0–0.2)

## 2023-06-18 LAB — COMPREHENSIVE METABOLIC PANEL
ALT: 60 U/L — ABNORMAL HIGH (ref 0–44)
AST: 47 U/L — ABNORMAL HIGH (ref 15–41)
Albumin: 2.4 g/dL — ABNORMAL LOW (ref 3.5–5.0)
Alkaline Phosphatase: 389 U/L — ABNORMAL HIGH (ref 38–126)
Anion gap: 9 (ref 5–15)
BUN: 60 mg/dL — ABNORMAL HIGH (ref 8–23)
CO2: 23 mmol/L (ref 22–32)
Calcium: 8.7 mg/dL — ABNORMAL LOW (ref 8.9–10.3)
Chloride: 100 mmol/L (ref 98–111)
Creatinine, Ser: 1.58 mg/dL — ABNORMAL HIGH (ref 0.61–1.24)
GFR, Estimated: 42 mL/min — ABNORMAL LOW (ref >60)
Glucose, Bld: 145 mg/dL — ABNORMAL HIGH (ref 70–99)
Potassium: 6.7 mmol/L (ref 3.5–5.1)
Sodium: 132 mmol/L — ABNORMAL LOW (ref 135–145)
Total Bilirubin: 1.1 mg/dL (ref 0.0–1.2)
Total Protein: 5.9 g/dL — ABNORMAL LOW (ref 6.5–8.1)

## 2023-06-18 LAB — HYPERSENSITIVITY PNEUMONITIS
A. Pullulans Abs: NEGATIVE
A.Fumigatus #1 Abs: NEGATIVE
Micropolyspora faeni, IgG: NEGATIVE
Pigeon Serum Abs: NEGATIVE
Thermoact. Saccharii: NEGATIVE
Thermoactinomyces vulgaris, IgG: NEGATIVE

## 2023-06-18 LAB — BLOOD GAS, ARTERIAL
Acid-base deficit: 0.9 mmol/L (ref 0.0–2.0)
Bicarbonate: 25.8 mmol/L (ref 20.0–28.0)
Drawn by: 11249
FIO2: 70 %
O2 Content: 60 L/min
O2 Saturation: 91 %
Patient temperature: 36.5
pCO2 arterial: 49 mm[Hg] — ABNORMAL HIGH (ref 32–48)
pH, Arterial: 7.33 — ABNORMAL LOW (ref 7.35–7.45)
pO2, Arterial: 61 mm[Hg] — ABNORMAL LOW (ref 83–108)

## 2023-06-18 LAB — GLUCOSE, CAPILLARY
Glucose-Capillary: 131 mg/dL — ABNORMAL HIGH (ref 70–99)
Glucose-Capillary: 186 mg/dL — ABNORMAL HIGH (ref 70–99)
Glucose-Capillary: 131 mg/dL — ABNORMAL HIGH (ref 70–99)
Glucose-Capillary: 192 mg/dL — ABNORMAL HIGH (ref 70–99)
Glucose-Capillary: 206 mg/dL — ABNORMAL HIGH (ref 70–99)

## 2023-06-18 LAB — POTASSIUM: Potassium: 5.6 mmol/L — ABNORMAL HIGH (ref 3.5–5.1)

## 2023-06-18 MED ORDER — DEXTROSE 50 % IV SOLN
1.0000 | Freq: Once | INTRAVENOUS | Status: AC
  Administered 2023-06-18: 50 mL via INTRAVENOUS
  Filled 2023-06-18: qty 50

## 2023-06-18 MED ORDER — MORPHINE SULFATE (PF) 2 MG/ML IV SOLN
2.0000 mg | INTRAVENOUS | Status: DC | PRN
  Administered 2023-06-19 (×3): 2 mg via INTRAVENOUS
  Filled 2023-06-18 (×4): qty 1

## 2023-06-18 MED ORDER — CALCIUM GLUCONATE-NACL 1-0.675 GM/50ML-% IV SOLN
1.0000 g | Freq: Once | INTRAVENOUS | Status: AC
  Administered 2023-06-18: 1000 mg via INTRAVENOUS
  Filled 2023-06-18: qty 50

## 2023-06-18 MED ORDER — METHYLNALTREXONE BROMIDE 12 MG/0.6ML ~~LOC~~ SOLN
12.0000 mg | Freq: Once | SUBCUTANEOUS | Status: AC
  Administered 2023-06-18: 12 mg via SUBCUTANEOUS
  Filled 2023-06-18: qty 0.6

## 2023-06-18 MED ORDER — SMOG ENEMA
960.0000 mL | Freq: Once | RECTAL | Status: AC
  Administered 2023-06-18: 500 mL via RECTAL
  Filled 2023-06-18: qty 960

## 2023-06-18 MED ORDER — MORPHINE SULFATE (CONCENTRATE) 10 MG /0.5 ML PO SOLN
10.0000 mg | ORAL | Status: DC | PRN
  Administered 2023-06-19: 10 mg via ORAL
  Filled 2023-06-18: qty 0.5

## 2023-06-18 MED ORDER — INSULIN ASPART 100 UNIT/ML IV SOLN
5.0000 [IU] | Freq: Once | INTRAVENOUS | Status: AC
  Administered 2023-06-18: 5 [IU] via INTRAVENOUS

## 2023-06-18 MED ORDER — SODIUM CHLORIDE 0.9 % IV BOLUS
250.0000 mL | Freq: Once | INTRAVENOUS | Status: AC
  Administered 2023-06-18: 250 mL via INTRAVENOUS

## 2023-06-18 MED ORDER — DEXMEDETOMIDINE HCL IN NACL 200 MCG/50ML IV SOLN
0.2000 ug/kg/h | INTRAVENOUS | Status: DC
  Administered 2023-06-18: 0.3 ug/kg/h via INTRAVENOUS
  Administered 2023-06-18 – 2023-06-19 (×2): 0.2 ug/kg/h via INTRAVENOUS
  Administered 2023-06-19: 0.4 ug/kg/h via INTRAVENOUS
  Filled 2023-06-18 (×4): qty 50

## 2023-06-18 MED ORDER — SODIUM ZIRCONIUM CYCLOSILICATE 10 G PO PACK
10.0000 g | PACK | Freq: Once | ORAL | Status: AC
  Administered 2023-06-18: 10 g via ORAL
  Filled 2023-06-18: qty 1

## 2023-06-18 MED ORDER — ALBUTEROL SULFATE (2.5 MG/3ML) 0.083% IN NEBU
10.0000 mg | INHALATION_SOLUTION | Freq: Once | RESPIRATORY_TRACT | Status: AC
  Administered 2023-06-18: 10 mg via RESPIRATORY_TRACT
  Filled 2023-06-18: qty 12

## 2023-06-18 MED ORDER — SODIUM BICARBONATE 8.4 % IV SOLN
50.0000 meq | Freq: Once | INTRAVENOUS | Status: AC
  Administered 2023-06-18: 50 meq via INTRAVENOUS
  Filled 2023-06-18: qty 50

## 2023-06-18 NOTE — Progress Notes (Signed)
   06/18/23 1600  Spiritual Encounters  Type of Visit Follow up  Care provided to: Pt and family  Conversation partners present during encounter Nurse  Referral source Patient request;Chaplain team;Clinical staff  Reason for visit Routine spiritual support  OnCall Visit No  Spiritual Framework  Presenting Themes Meaning/purpose/sources of inspiration;Values and beliefs;Significant life change;Impactful experiences and emotions;Rituals and practive;Community and relationships;Courage hope and growth  Values/beliefs Visited by Personal Pastor Barrett  Community/Connection Friend(s);Family;Faith community;Spiritual leader  Patient Stress Factors Loss of control;Major life changes  Family Stress Factors Major life changes;Loss of control;Loss  Interventions  Spiritual Care Interventions Made Established relationship of care and support;Compassionate presence;Reflective listening;Normalization of emotions;Explored ethical dilemma;Decision-making support/facilitation;Reconciliation with self/others;Narrative/life review;Explored values/beliefs/practices/strengths;Meaning making;Bereavement/grief support;Mindfulness intervention;Prayer  Intervention Outcomes  Outcomes Connection to spiritual care;Awareness around self/spiritual resourses;Connection to values and goals of care;Autonomy/agency;Awareness of health;Awareness of support;Reduced anxiety;Patient family open to resources  Spiritual Care Plan  Spiritual Care Issues Still Outstanding Lunette Stands will continue to follow   Chaplain did a follow up visit for patient.  9 family memebrs gathered and chaplain met their family pastor (who did their adult child's funeral) provided spiritual care for family as they expressed their anticipatory grief.  Provided spiritual care for wife at bedside and for Ree Kida as he expressed his love and gratitude to his family.  Supported Engineer, manufacturing as well in this process.  Ended visit with a departing blessing

## 2023-06-18 NOTE — Progress Notes (Addendum)
eLink Physician-Brief Progress Note Patient Name: Corey [name] DOB: 1934-07-29 MRN: [id number]   Date of Service  06/18/2023  HPI/Events of Note  afib with rate 40's  9/39(59)    Precedex just d/c     nurse in room, calling for labs now .   EF 50-55% on echo 06-07-23  Camera: Discussed with RN. Hemodynamically stable a fib slow Rate  between 40 to 50's. On optiflow, propped up.  Labs reviewed K was 5.8 , from yesterday 19 th EKG reviewed: a fib HR was 71.   eICU Interventions  Saline 250 bolus Stat AM labs Stat ABG     Intervention Category Intermediate Interventions: Arrhythmia - evaluation and management  Ranee Gosselin 06/18/2023, 2:21 AM  03:42 K at 6.7, brady earlirt. No tall T wave. Cr stable at 1.5 7.33/49/61  Can able to take orally on Camera evaluation and discussion with RN. Glucose > 120.  - stat Hyperkalemia protocol ordered, follow K level in one hour again.

## 2023-06-18 NOTE — Progress Notes (Signed)
Progress Note   Patient: Corey [name] OZD:[id number] DOB: [birth year] DOA: 06/07/2023     10 DOS: the patient was seen and examined on 06/18/2023   Brief hospital course: 89yo with h/o afib on Eliquis, BPH, and recently diagnosed (03/2023) stage IV metastatic neuroendocrine tumor with unknown primary who presented on 1/9 with weakness and SOB.  He was recently started on Afinitor but was unable to tolerate it due to significant fatigue and cough.  He was awaiting consultation with Dr. Amil Amen for Lutathera therapy but missed his appointment because of this hospitalization.  He saw his oncologist on 1/6 and CXR showed pneumonia.  He was then seen by his cardiologist on 1/7 and there was concern for heart failure so he was started on Lasix.  He had a good response to Lasix and his peripheral edema resolved however his cough and dyspnea continued.  Given this he presented to the ED and further workup showed evidence of progressive pulmonary consolidation.  He was started on azithromycin and Rocephin and repeat CXR showed "Interval worsening multifocal left lung pneumonia". Chest CT showed no evidence of PE but did show possible pulmonary edema.  He was given IV Solu-Medrol and a dose of IV Lasix and cardiology has been consulted.  Pulmonary added IV steroids 125 mg every 12 for 5 more doses.  Respiratory status worsened on 1/14 and he was transferred to the stepdown unit and placed on heated high flow nasal cannula 60 L.  Antibiotics escalated to cefepime and pulmonary added Bactrim.  Inflammatory markers being checked and cardiology give another dose of diuresis.    Assessment and Plan:  Acute Respiratory Failure with Hypoxia  Presented with acute respiratory failure with hypoxia and had a reported pulse oximetry of 85% on room air at home Ddx includes PNA, pneumonitis (had 1 dose of everolimus recently), cardiogenic edema Most likely etiology of his hypoxia due to community-acquired pneumonia; PE  was considered in the setting of his malignancy but is unlikely given that the patient is already anticoagulated Eliquis  He acutely decompensated on 1/14 and was transferred to SDU, is currently on 60L HFNC O2, FiO2 80% Repeat CXR on 1/15 showed persistent B airspace opacities, L > R, unchanged Pulmonary consulted Given Solumedrol -> prednisone and Lasix (now off) Also added Xopenex/Atrovent and also on Budesonide 0.25 mg twice daily Remains on HFNC O2 (unable to tolerate BIPAP) He remains SOB and having air hunger issues Morphine added by palliative care for air hunger Precedex helped air hunger but he had bradycardia; starting back at lower dose Appears to be continuing to decline, overall poor prognosis at this time   Fungal Pneumonia Has had progressive cough, hypoxia, multilobar pneumonia seen on chest x-ray and has been progressive when compared to x-ray from 06/04/2023 No fever, leukocytosis, or other evidence of sepsis Completed Azithromycin/Ceftriaxone, s/p 1 dose of Vanc, started on Cefepime and Bactrim on 1/14 Started on Solumedrol 120 mg IV q12 x 3 days with plan for extended steroid taper with prednisone (60 mg starting 1/16) Incentive spirometer, and flutter valve and Guaifenesin Today's CXR with no interval change, persistent L > R airspace opacities Fungitell is positive, started on Micafungin  Opiate-induced constipation Treating with Relistor   Acute on Chronic Diastolic CHF He had responded well to several days of p.o. Lasix, currently has no evidence of fluid overload. Holding Lasix at this time Cardiology has been following but will sign off for now Echocardiogram 1/9 with EF of 50-55% with grade 1 diastolic  dysfunction    Bacteremia, factitious Patient grew Staph hominis in 1 culture but other culture negative and repeat cultures negative x 5 days Very likely contaminant   Stage IV Neuroendocrine Tumor Dr. Mosetta Putt consulted on 1/13 and recommended continued  antibiotics for now Started on Everolimus (Afinitor) on 12/18, stopped on 1/6 due to poor tolerance He is planning to start bimonthly infusions with Lutathera once improved Will need outpatient follow-up with Dr. Mosetta Putt and Dr. Amil Amen as an outpatient once stable for dc Palliative care consulting   Paroxysmal Atrial Fibrillation Continue Flecainide  Continue Eliquis   Stage 3a CKD Baseline creatinine appears to be 1.1-1.3 Appears to be stable at this time Attempt to avoid nephrotoxic medications Recheck BMP in AM    Abnormal LFTs Worsening liver function RUQ [place] with extensive hepatic metastatic disease but no acute findings   Hypertension Hold valsartan/HCTZ If BP is uptrending, can resume hole amlodipine   Diabetes Mellitus Type 2 with Hyperglycemia  HbA1c was 7.9, at goal of <8 based on age Not on home meds Glucose significantly elevated from steroids Added Semglee 10 units + 4 units qAC + resistant-scale SSI with significant improvement   Depression and Anxiety Continue buproprion, buspirone, and mirtazepine   Normocytic Anemia Checked Anemia Panel showed an iron level of 30, UIBC 258, TIBC 288, saturation history of 10%, ferritin level of 84, folate of 4.9 and vitamin B12 level of 888 Started Folic Acid 1 mg po Daily  Appears to be relatively stable Continue to monitor   HLD Continue atorvastatin for now Monitor LFTs, as this may need to be help if liver function worsens   GERD/GI Prophylaxis Continue PPI    Severe malnutrition Nutrition Problem: Severe Malnutrition Etiology: chronic illness Signs/Symptoms: severe fat depletion, severe muscle depletion Interventions: Ensure Enlive (each supplement provides 350kcal and 20 grams of protein), MVI, Magic cup   DNR/GOC Code status changed after palliative consultation on 1/17  Patient has had continued daily deterioration His wife is adamant that we continue to aggressively treat him, but the likelihood for  recovery that will be meaningful enough for him to be able to complete cancer treatment is increasingly low Transition to comfort care would be reasonable Family is considering, his son is currently en route from [place], [place] Methylnaltrexone and Precedex were added today for comfort       Consultants: Oncology Pulmonology Cardiology Palliative care [facility] team DM coordinator   Procedures: Echocardiogram 1/9   Antibiotics: Azithromycin 1/9-13 Ceftriaxone 1/9-14 Cefepime 1/14- Bactrim 1/14- Vancomycin x 1 Micafungin 1/16-  30 Day Unplanned Readmission Risk Score    Flowsheet Row ED to Hosp-Admission (Current) from 06/07/2023 in [facility]-ICU/STEPDOWN  30 Day Unplanned Readmission Risk Score (%) 26.85 Filed at 06/18/2023 0401       This score is the patient's risk of an unplanned readmission within 30 days of being discharged (0 -100%). The score is based on dignosis, age, lab data, medications, orders, and past utilization.   Low:  0-14.9   Medium: 15-21.9   High: 22-29.9   Extreme: 30 and above           Subjective: He has been having some hallucinations, conversations with his deceased daughter.  He knows that he is "not good", moaning and struggling with respiratory effort more.     Objective: Vitals:   06/18/23 0353 06/18/23 0400  BP:    Pulse:    Resp:    Temp:  97.9 F (36.6 C)  SpO2: 92%  Intake/Output Summary (Last 24 hours) at 06/18/2023 0724 Last data filed at 06/18/2023 0526 Gross per 24 hour  Intake 842.54 ml  Output 925 ml  Net -82.46 ml   Filed Weights   06/15/23 0500 06/16/23 0500 06/17/23 0500  Weight: 87.1 kg 89.3 kg 87.5 kg    Exam:  General:  Appears ill, increasing respiratory distress despite on HFNC O2, +conversational dyspnea, appears more fatigued and moving towards dying Eyes:  EOMI, normal lids, iris ENT:  hard of hearing, grossly normal lips & tongue, mmm Neck:  no LAD, masses or  thyromegaly Cardiovascular:  RRR, no m/r/g. No LE edema.  Respiratory:   Scattered coarse rhonchi.   Moderately increased respiratory effort on HFNC O2 Abdomen:  soft, NT, +distention Skin:  no rash or induration seen on limited exam Musculoskeletal:  grossly normal tone BUE/BLE, good ROM, no bony abnormality Psychiatric:  grossly normal mood and affect, speech fluent and appropriate, AOx3 Neurologic:  CN 2-12 grossly intact, moves all extremities in coordinated fashion  Data Reviewed: I have reviewed the patient's lab results since admission.  Pertinent labs for today include:   ABG: 7.33/49/61/91% Na++ 132 K+ 6.7, 5.6 Glucose 145 BUN 60/Creatinine 1.58/GFR 42 AP 389 Albumin 2.4 ALT 47/AST 60 WBC 14.2    Family Communication: Daughter was present throughout evaluation  Disposition: Status is: Inpatient Remains inpatient appropriate because: critically ill  Total critical care time: 35 minutes Critical care time was exclusive of separately billable procedures and treating other patients. Critical care was necessary to treat or prevent imminent or life-threatening deterioration. Critical care was time spent personally by me on the following activities: development of treatment plan with patient and/or surrogate as well as nursing, discussions with consultants, evaluation of patient's response to treatment, examination of patient, obtaining history from patient or surrogate, ordering and performing treatments and interventions, ordering and review of laboratory studies, ordering and review of radiographic studies, pulse oximetry and re-evaluation of patient's condition.    Unresulted Labs (From admission, onward)     Start     Ordered   06/15/23 0931  Aspergillus antibody by immunodiff  Once,   R       Question:  Specimen collection method  Answer:  Lab=Lab collect   06/15/23 0930   06/15/23 0500  CBC with Differential/Platelet  Daily,   R     Question:  Specimen collection  method  Answer:  Lab=Lab collect   06/14/23 1313   06/15/23 0500  Comprehensive metabolic panel  Daily,   R     Question:  Specimen collection method  Answer:  Lab=Lab collect   06/14/23 1313   06/12/23 0923  Hypersensitivity Pneumonitis  Once,   R       Question:  Specimen collection method  Answer:  Lab=Lab collect   06/12/23 5284             Author: Jonah Blue, MD 06/18/2023 7:24 AM  For on call review [email or web address].

## 2023-06-18 NOTE — Progress Notes (Signed)
Corey [name]   DOB:1934/08/25   QM#:[id number]   BMW#:[id number]  MED/ONC FOLLOW UP NOTE   Subjective: Patient has overall further deteriorated over the last week, especially in the past few days.  He has required higher oxygen supplement, and has been very cachectic, more lethargic.  Family members at the bedside.  Objective:  Vitals:   06/18/23 2000 06/18/23 2005  BP: (!) 160/67   Pulse: 72   Resp: (!) 26   Temp:    SpO2: 97% 96%    Body mass index is 27.17 kg/m.  Intake/Output Summary (Last 24 hours) at 06/18/2023 2313 Last data filed at 06/18/2023 2152 Gross per 24 hour  Intake 1906.29 ml  Output 1500 ml  Net 406.29 ml     Sclerae unicteric  Oropharynx clear  No peripheral adenopathy  Lungs(+) b/l rales   Heart regular rate and rhythm  Abdomen benign  MSK no focal spinal tenderness, no peripheral edema  Neuro nonfocal    CBG (last 3)  Recent Labs    06/18/23 0752 06/18/23 1144 06/18/23 1658  GLUCAP 131* 192* 206*     Labs:   Urine Studies No results for input(s): "UHGB", "CRYS" in the last 72 hours.  Invalid input(s): "UACOL", "UAPR", "USPG", "UPH", "UTP", "UGL", "UKET", "UBIL", "UNIT", "UROB", "ULEU", "UEPI", "UWBC", "URBC", "UBAC", "CAST", "UCOM", "BILUA"  Basic Metabolic Panel: Recent Labs  Lab 06/12/23 0535 06/13/23 0258 06/14/23 0251 06/15/23 0257 06/16/23 0311 06/17/23 0256 06/18/23 0223 06/18/23 0500  NA 132* 136 135 136 132* 132* 132*  --   K 4.0 3.8 3.7 4.3 4.9 5.8* 6.7* 5.6*  CL 96* 101 99 101 98 97* 100  --   CO2 25 22 25 24 24 26 23   --   GLUCOSE 351* 128* 94 115* 127* 90 145*  --   BUN 44* 50* 58* 52* 48* 48* 60*  --   CREATININE 1.16 1.31* 1.47* 1.36* 1.12 1.25* 1.58*  --   CALCIUM 8.7* 8.7* 8.8* 8.9 9.0 9.0 8.7*  --   MG 2.8* 2.8*  --   --   --   --   --   --   PHOS 4.0 3.9  --   --   --   --   --   --    GFR Estimated Creatinine Clearance: 37.9 mL/min (A) (by C-G formula based on SCr of 1.58 mg/dL (H)). Liver Function  Tests: Recent Labs  Lab 06/14/23 0251 06/15/23 0257 06/16/23 0311 06/17/23 0256 06/18/23 0223  AST 69* 105* 62* 52* 47*  ALT 60* 100* 77* 64* 60*  ALKPHOS 351* 409* 408* 405* 389*  BILITOT 0.9 1.1 1.0 0.9 1.1  PROT 6.5 5.9* 6.1* 6.2* 5.9*  ALBUMIN 2.7* 2.5* 2.5* 2.6* 2.4*   No results for input(s): "LIPASE", "AMYLASE" in the last 168 hours.  No results for input(s): "AMMONIA" in the last 168 hours. Coagulation profile No results for input(s): "INR", "PROTIME" in the last 168 hours.  CBC: Recent Labs  Lab 06/14/23 0251 06/15/23 0257 06/16/23 0311 06/17/23 0256 06/18/23 0223  WBC 18.2* 16.9* 16.5* 17.4* 14.2*  NEUTROABS 15.0* 14.4* 14.2* 14.7* 12.1*  HGB 12.6* 12.1* 12.2* 12.5* 12.2*  HCT 38.6* 38.2* 38.1* 39.2 39.0  MCV 88.1 89.3 89.4 90.5 91.8  PLT 369 328 352 409* 396   Cardiac Enzymes: Recent Labs  Lab 06/14/23 1001  CKTOTAL 139   BNP: Invalid input(s): "POCBNP" CBG: Recent Labs  Lab 06/18/23 0222 06/18/23 0452 06/18/23 0752 06/18/23 1144 06/18/23 1658  GLUCAP 131* 186* 131* 192* 206*   D-Dimer No results for input(s): "DDIMER" in the last 72 hours. Hgb A1c No results for input(s): "HGBA1C" in the last 72 hours.  Lipid Profile No results for input(s): "CHOL", "HDL", "LDLCALC", "TRIG", "CHOLHDL", "LDLDIRECT" in the last 72 hours. Thyroid function studies No results for input(s): "TSH", "T4TOTAL", "T3FREE", "THYROIDAB" in the last 72 hours.  Invalid input(s): "FREET3" Anemia work up No results for input(s): "VITAMINB12", "FOLATE", "FERRITIN", "TIBC", "IRON", "RETICCTPCT" in the last 72 hours.  Microbiology Recent Results (from the past 240 hours)  SARS Coronavirus 2 by RT PCR (hospital order, performed in [facility] hospital lab) *cepheid single result test* Anterior Nasal Swab     Status: None   Collection Time: 06/09/23  7:25 PM   Specimen: Anterior Nasal Swab  Result Value Ref Range Status   SARS Coronavirus 2 by RT PCR NEGATIVE NEGATIVE  Final    Comment: (NOTE) SARS-CoV-2 target nucleic acids are NOT DETECTED.  The SARS-CoV-2 RNA is generally detectable in upper and lower respiratory specimens during the acute phase of infection. The lowest concentration of SARS-CoV-2 viral copies this assay can detect is 250 copies / mL. A negative result does not preclude SARS-CoV-2 infection and should not be used as the sole basis for treatment or other patient management decisions.  A negative result may occur with improper specimen collection / handling, submission of specimen other than nasopharyngeal swab, presence of viral mutation(s) within the areas targeted by this assay, and inadequate number of viral copies (<250 copies / mL). A negative result must be combined with clinical observations, patient history, and epidemiological information.  Fact Sheet for Patients:   RoadLapTop.co.za  Fact Sheet for Healthcare Providers: http://kim-miller.com/  This test is not yet approved or  cleared by the [place] FDA and has been authorized for detection and/or diagnosis of SARS-CoV-2 by FDA under an Emergency Use Authorization (EUA).  This EUA will remain in effect (meaning this test can be used) for the duration of the COVID-19 declaration under Section 564(b)(1) of the Act, 21 U.S.C. section 360bbb-3(b)(1), unless the authorization is terminated or revoked sooner.  Performed at [facility], [address], [place], [place] [address]   MRSA Next Gen by PCR, Nasal     Status: None   Collection Time: 06/12/23  1:05 PM   Specimen: Nasal Mucosa; Nasal Swab  Result Value Ref Range Status   MRSA by PCR Next Gen NOT DETECTED NOT DETECTED Final    Comment: (NOTE) The GeneXpert MRSA Assay (FDA approved for NASAL specimens only), is one component of a comprehensive MRSA colonization surveillance program. It is not intended to diagnose MRSA infection nor to  guide or monitor treatment for MRSA infections. Test performance is not FDA approved in patients less than 83 years old. Performed at [facility], [address] [place], [place] [address]       Studies:  DG CHEST PORT 1 VIEW Result Date: 06/17/2023 CLINICAL DATA:  Fungal pneumonia EXAM: PORTABLE CHEST 1 VIEW COMPARISON:  06/15/2023 FINDINGS: Artifact overlies the chest. Widespread pulmonary infiltrates persist, more extensive in the left lung than the right. Allowing for technical differences, no significant change since two days ago. No visible effusion. IMPRESSION: No significant change since two days ago. Widespread pulmonary infiltrates, more extensive in the left lung than the right. Electronically Signed   By: Paulina Fusi M.D.   On: 06/17/2023 11:04   DG Abd 1 View Result Date:  06/17/2023 CLINICAL DATA:  Abdominal distension EXAM: ABDOMEN - 1 VIEW COMPARISON:  03/21/2023 FINDINGS: Diffuse bowel gas without obstructive pattern or abnormal stool retention. Leftward displacement of the gastric bubble related to liver enlargement by prior CT. Cholecystectomy clips. No concerning calcification. IMPRESSION: Stable when compared to abdominal CT 03/21/2023. Nonobstructive bowel gas. Electronically Signed   By: Tiburcio Pea M.D.   On: 06/17/2023 10:35    Assessment: 89 y.o. male   Multifocal community-acquired pneumonia, vs virus or medication induced pneumonitis Hypoxic respiratory failure Stage IV neuroendocrine tumor, status post first-line everolimus CHF with acute pulmonary edema HTN and AF    Plan:  -chart and lab reviewed, he has developed multiorgan failure, with AKI, worsening hypoxia and tachypnea, despite maximal antibiotics, large dose of steroid and supportive care. -His overall prognosis is guarded.  I do not think he will survive, I agree with comfort care.  The primary team and palliative care Dr. Patterson Hammersmith have had extensive discussion with patient's  wife and children.  I spoke with his wife and daughter at the bedside, daughter understands the poor prognosis very well and agrees with comfort care, however his wife is not ready.  she has repeatedly asked why his metastatic neuroendocrine tumor was not treated earlier.  I explained to her that he did try Afinitor for 3 weeks, and stopped due to worsening dyspnea and hypoxia.  I do not think any of other NET treatment will change the outcome, and I am not sure if his pulmonary change is related to his NET.  -I will f/u as needed, appreciate the excellent care from our hospitalist, ICU team and palliative care teams.    [name], MD 06/18/2023

## 2023-06-18 NOTE — Plan of Care (Signed)
  Problem: Clinical Measurements: Goal: Respiratory complications will improve Outcome: Not Progressing   Problem: Nutrition: Goal: Adequate nutrition will be maintained Outcome: Not Progressing   Problem: Elimination: Goal: Will not experience complications related to bowel motility Outcome: Not Progressing Goal: Will not experience complications related to urinary retention Outcome: Not Progressing   Problem: Skin Integrity: Goal: Risk for impaired skin integrity will decrease Outcome: Not Progressing

## 2023-06-18 NOTE — Progress Notes (Signed)
NAME:  Corey [name], MRN:  [id number], DOB:  05-08-1935, LOS: 10 ADMISSION DATE:  06/07/2023, CONSULTATION DATE: 06/10/2023 REFERRING MD: Dr. Marland Mcalpine, CHIEF COMPLAINT: Acute hypoxemic respiratory failure  History of Present Illness:  89 year old man with a history of former tobacco, newly diagnosed metastatic neuroendocrine tumor followed by Dr. Mosetta Putt.  Also with atrial fibrillation (now NSR), hypertension, CAD with a reassuring cardiac stress test 2023.  EF 51%.  For his neuroendocrine tumor he has been treated initially with Lutathera (stopped for transaminitis) and then everolimus for 3 weeks (stopped 1/6).  At that visit 1/6 he was found to be hypoxemic and had patchy bilateral pulmonary infiltrates on chest x-ray.  Subsequently seen by Dr. Rennis Golden with cardiology and started on furosemide for possible component of pulmonary edema.  Now admitted 06/07/2023 with progressive dyspnea and cough.  He was started on azithromycin and ceftriaxone for possible community-acquired pneumonia.  Chest x-ray at the time of admission shows progression of left-sided hazy pulmonary infiltrates.  Pertinent  Medical History   Past Medical History:  Diagnosis Date   Basal cell carcinoma of auricle of right ear    BPH (benign prostatic hyperplasia)    DJD (degenerative joint disease)    left   ED (erectile dysfunction)    Fatigue    Hearing loss, sensorineural    Hx of adenomatous colonic polyps    Hx of major depression    Hypertension    Iron deficiency anemia    Nephrolithiasis    Paroxysmal atrial fibrillation (HCC)    Seasonal allergic rhinitis   Metastatic neuroendocrine tumor Atrial fibrillation   Significant Hospital Events: Including procedures, antibiotic start and stop dates in addition to other pertinent events   1/14 increase in O2 requirements overnight, transferred to step down unit on [facility] 1/15 on HHFNC 60L 80% FiO2 1/16 on HHFNC 55L 90% FiO2, tried bipap overnight 1/17 remained on  HHFNC 60L 90% FiO2 1/20 remains on high flow nasal cannula, did have bradycardic episode with Precedex at 0.4 mg on 1/19  Interim History / Subjective:   Uncomfortable Denies pain Increased work of breathing Still has not moved his bowels yet  Objective   Blood pressure (!) 113/52, pulse 73, temperature 97.9 F (36.6 C), temperature source Axillary, resp. rate (!) 25, height 6\' 1"  (1.854 m), weight 87.5 kg, SpO2 92%.    FiO2 (%):  [70 %-90 %] 80 %   Intake/Output Summary (Last 24 hours) at 06/18/2023 0730 Last data filed at 06/18/2023 5784 Gross per 24 hour  Intake 842.54 ml  Output 925 ml  Net -82.46 ml   Filed Weights   06/15/23 0500 06/16/23 0500 06/17/23 0500  Weight: 87.1 kg 89.3 kg 87.5 kg    Examination: General: Elderly gentleman, does not appear comfortable, increased work of breathing HENT: Moist oral mucosa, Lungs: Some rhonchi Cardiovascular: S1-S2 appreciated Abdomen: Bowel sounds appreciated, abdomen is full Extremities: No edema Neuro: Awake, alert, follows commands GU: Deferred   I reviewed nursing notes, Consultant notes, hospitalist notes, last 24 h vitals and pain scores, last 48 h intake and output, last 24 h labs and trends, and last 24 h imaging results.  Hyperkalemia has been treated Resolved Hospital Problem list     Assessment & Plan:   Acute hypoxemic respiratory failure with pulmonary infiltrates Concern for pneumonitis related to recent medication versus lymphangitic spread of cancer versus infectious infiltrate -Completed course of cefepime -Started on empiric Bactrim coverage for pneumocystis-started on 1/14-context of immunosuppression, elevated LDH, positive  Fungitell, significant need for oxygen supplementation -Micafungin started 1/16 -Currently on prednisone 60 mg daily, was on Solu-Medrol 125 twice daily for 3 days -Continue high flow nasal cannula  For work of breathing continue morphine Precedex did help work of breathing but  did cause bradycardia, will start Precedex at a lower dose  Relistor was given for abdominal discomfort and possible opiate induced constipation, will repeat dose today-after 1400 hrs.  Appreciate palliative care follow-up -Discussed with patient's daughter at bedside  Staph hominis in 1 of 4 blood cultures likely contaminants  Hypertension Paroxysmal atrial fibrillation Heart failure with preserved ejection fraction -Appreciate cardiology follow-up  Metastatic neuroendocrine tumor -Follows up with Dr. Mosetta Putt -Appreciate oncology assistance   Hyperkalemia being treated  Transaminitis -Appears stable -Will continue to follow  Acute kidney injury  Patient not a bronchoscopy candidate -Will not be able to wean off a ventilator  Palliative measures may ultimately be the best course for patient  Labs   CBC: Recent Labs  Lab 06/14/23 0251 06/15/23 0257 06/16/23 0311 06/17/23 0256 06/18/23 0223  WBC 18.2* 16.9* 16.5* 17.4* 14.2*  NEUTROABS 15.0* 14.4* 14.2* 14.7* 12.1*  HGB 12.6* 12.1* 12.2* 12.5* 12.2*  HCT 38.6* 38.2* 38.1* 39.2 39.0  MCV 88.1 89.3 89.4 90.5 91.8  PLT 369 328 352 409* 396    Basic Metabolic Panel: Recent Labs  Lab 06/12/23 0535 06/13/23 0258 06/14/23 0251 06/15/23 0257 06/16/23 0311 06/17/23 0256 06/18/23 0223 06/18/23 0500  NA 132* 136 135 136 132* 132* 132*  --   K 4.0 3.8 3.7 4.3 4.9 5.8* 6.7* 5.6*  CL 96* 101 99 101 98 97* 100  --   CO2 25 22 25 24 24 26 23   --   GLUCOSE 351* 128* 94 115* 127* 90 145*  --   BUN 44* 50* 58* 52* 48* 48* 60*  --   CREATININE 1.16 1.31* 1.47* 1.36* 1.12 1.25* 1.58*  --   CALCIUM 8.7* 8.7* 8.8* 8.9 9.0 9.0 8.7*  --   MG 2.8* 2.8*  --   --   --   --   --   --   PHOS 4.0 3.9  --   --   --   --   --   --    GFR: Estimated Creatinine Clearance: 37.9 mL/min (A) (by C-G formula based on SCr of 1.58 mg/dL (H)). Recent Labs  Lab 06/14/23 1001 06/15/23 0257 06/16/23 0311 06/17/23 0256 06/18/23 0223   PROCALCITON 0.21  --   --  0.19  --   WBC  --  16.9* 16.5* 17.4* 14.2*    Liver Function Tests: Recent Labs  Lab 06/14/23 0251 06/15/23 0257 06/16/23 0311 06/17/23 0256 06/18/23 0223  AST 69* 105* 62* 52* 47*  ALT 60* 100* 77* 64* 60*  ALKPHOS 351* 409* 408* 405* 389*  BILITOT 0.9 1.1 1.0 0.9 1.1  PROT 6.5 5.9* 6.1* 6.2* 5.9*  ALBUMIN 2.7* 2.5* 2.5* 2.6* 2.4*   No results for input(s): "LIPASE", "AMYLASE" in the last 168 hours.  No results for input(s): "AMMONIA" in the last 168 hours.  ABG    Component Value Date/Time   PHART 7.33 (L) 06/18/2023 0245   PCO2ART 49 (H) 06/18/2023 0245   PO2ART 61 (L) 06/18/2023 0245   HCO3 25.8 06/18/2023 0245   ACIDBASEDEF 0.9 06/18/2023 0245   O2SAT 91 06/18/2023 0245     Coagulation Profile: No results for input(s): "INR", "PROTIME" in the last 168 hours.  Cardiac Enzymes: Recent Labs  Lab 06/14/23 1001  CKTOTAL 139    HbA1C: Hgb A1c MFr Bld  Date/Time Value Ref Range Status  06/12/2023 01:03 PM 7.9 (H) 4.8 - 5.6 % Final    Comment:    (NOTE) Pre diabetes:          5.7%-6.4%  Diabetes:              >6.4%  Glycemic control for   <7.0% adults with diabetes     CBG: Recent Labs  Lab 06/17/23 1205 06/17/23 1632 06/17/23 2157 06/18/23 0222 06/18/23 0452  GLUCAP 110* 113* 148* 131* 186*    The patient is critically ill with multiple organ systems failure and requires high complexity decision making for assessment and support, frequent evaluation and titration of therapies, application of advanced monitoring technologies and extensive interpretation of multiple databases. Critical Care Time devoted to patient care services described in this note independent of APP/resident time (if applicable)  is 32 minutes.   Virl Diamond MD [facility] Personal pager: See Amion If unanswered, please page CCM On-call: #[phone]

## 2023-06-18 NOTE — Progress Notes (Signed)
Daily Progress Note   Patient Name: Corey [name]       Date: 06/18/2023 DOB: 1935-05-13  Age: 89 y.o. MRN#: [id number] Attending Physician: Jonah Blue, MD Primary Care Physician: Emilio Aspen, MD Admit Date: 06/07/2023 Length of Stay: 10 days  Reason for Consultation/Follow-up: Establishing goals of care  Subjective:   CC: Patient's work of breathing increased today and he is becoming more lethargic.  Following up regarding complex medical decision making.  Subjective:  Reviewed EMR prior to presenting to bedside.  Patient has been receiving oral morphine scheduled 7.5 mg every 4 hours scheduled.  Patient also required morphine IV as needed 2 mg x 4 doses.  Yesterday patient was started on Precedex to help with work of breathing and had episodes of bradycardia.  Precedex weaned to lower dose.  Patient unable to wean from high flow nasal cannula support.  Lab work showing creatinine now increasing to 1.58.  Discussed care with bedside RN for medical updates.  Patient receiving enema this morning to determine if bowel movement will help with work of breathing.  When seeing patient this morning, he is very lethargic.  Patient appears ill and exhausted.  Patient will awaken though acknowledges how difficult it is for him to interact due to his work of breathing.  Noted with discussed care with his family to which patient agreed.  Multiple family members out in waiting area.  Patient's wife present that did not want to involve in discussions with this provider as she knew that she had a migraine.  With her permission, able to speak to patient's daughter and son separately.  Chaplain was able to join during this conversation.  Spent time discussing that despite all appropriate medical interventions patient's medical status has continued to worsen.  Patient is unable to wean from high flow nasal cannula.  Patient's lab work showing that his body is tiring out and trying to shut  down.  Spent time discussing what transition to comfort focused care would entail.  Did express concern that once patient is made comfortable and high flow nasal cannula was removed, time would likely be minutes to hours based on how much oxygen support he is needing.  Discussed with comfort care patient would require medications for sedation due to his work of breathing.  Daughter and son acknowledged this and can verbalize that they think their father would want to be comfortable in the situation.  Explained that legally patient's wife has to be medical decision-maker unless she defers medical decision making to them.  They nauseous as well and plan to talk with patient's wife.  Followed up shortly thereafter.  Daughter and son have discussed with wife and she is understandably distraught.  Chaplain attempting to provide support to wife.  They are trying to support her while also trying to balance patient no longer suffering without transitioning to full comfort focused care.  Noted we can appropriately discontinue certain interventions that are no longer helping such as lab work because will not ultimately change outcome.  Children agreement acknowledging this.  They are going to continue discussions with her mother.  Noted palliative medicine team continue to follow along and assist as able.  Did inform them that this provider would be going off service and my colleague Dr. Linna Darner would be coming on service tomorrow.  Updated IDT throughout the day including chaplain, hospitalist, RN, PCCM, and oncology.  Objective:   Vital Signs:  BP (!) 113/52   Pulse 73   Temp  97.9 F (36.6 C) (Axillary)   Resp (!) 25   Ht 6\' 1"  (1.854 m)   Wt 87.5 kg   SpO2 92%   BMI 25.45 kg/m   Physical Exam: General: NAD, appears tired, laying in bed, ill appearing  HENT: HFNC in place Cardiovascular: RRR Respiratory: increased work of breathing noted, on HFNC Neuro: becoming lethargic   Imaging: I personally  reviewed recent imaging.   Assessment & Plan:   Assessment: Patient is an 89 year old male with a past medical history of A-fib on Eliquis, BPH, and recently diagnosed stage IV metastatic neuroendocrine tumor with unknown primary who was admitted on 06/07/2023 for management of weakness and shortness of breath.  Patient had recently been started Afinitor for cancer management.  This was discontinued as patient was unable to tolerate in setting of significant fatigue and cough.  Patient was referred to Dr. Ty Hilts for [facility] therapy though has been hospitalized and unable to attend this appointment.  During hospitalization patient has had worsening acute respiratory failure with noted hypoxia.  Pulmonology following to assist with management.  Oncology following in setting of metastatic neuroendocrine tumor.  Cardiology consulted for concerns of cardiogenic edema.  Palliative medicine team consulted to assist with complex medical decision making.   Recommendations/Plan: # Complex medical decision making/goals of care:     -Patient becoming more fatigued and lethargic to know his multiple medical conditions and increased work of breathing despite appropriate medical interventions.  Physically appears that patient's body shutting down despite aggressive medical interventions.  -Discussed care with patient's daughter and son as detailed above in HPI.  Patient's wife did not want to speak with this provider.  Son and daughter are attempting to coordinate care between their father and mother as patient's wife is incredibly distraught at the thought of patient dilation.  Did discuss with children with transition to comfort focused care" entail and concern that if this transition occurs time would likely be short as on minutes to hours due to his high oxygen requirements.  Discussed we will discontinue interventions that would not ultimately change patient's outcome such as lab work and imaging at this time.   Continuing discussions regarding care moving forward as able.    Code Status: Limited: Do not attempt resuscitation (DNR) -DNR-LIMITED -Do Not Intubate/DNI   # Symptom management Patient is receiving these palliative interventions for symptom management with an intent to improve quality of life.                 -Dyspnea, in setting of acute hypoxic respiratory failure possibly multifactorial   -Continue oral morphine solution to 7.5 mg every 4 hours scheduled.  Patient can always refuse.  Hold for respiratory rate less than 12.                              -Change morphine oral solution to 10 mg every 2 hours as needed   -Change IV morphine 2 mg every 2 hours as needed for breakthrough management after oral morphine or if patient on BiPAP overnight and unable to take oral medications                               -Continue morphine for inhalation 10 mg every 3 hours as needed.  Can use oral morphine and inhaled morphine since inhaled morphine does not cause systemic effects.   -Discussed with PCCM  provider and Precedex has been added as well                              -Monitor kidney function while receiving oral morphine   # Psycho-social/Spiritual Support:  - Support System: wife, brother, niece, cousin  # Discharge Planning: To Be Determined  Discussed with: Patient, PCCM, RN, hospitalist, patient's family including son and daughter  Thank you for allowing the palliative care team to participate in the care [name].  Alvester Morin, DO Palliative Care Provider PMT # [phone]  If patient remains symptomatic despite maximum doses, please call PMT at [phone] between 0700 and 1900. Outside of these hours, please call attending, as PMT does not have night coverage.  Personally spent 55 minutes in patient care including extensive chart review (labs, imaging, progress/consult notes, vital signs), medically appropraite exam, discussed with treatment team, education to  patient, family, and staff, documenting clinical information, medication review and management, coordination of care, and available advanced directive documents.

## 2023-06-18 NOTE — Plan of Care (Signed)
  Problem: Education: Goal: Knowledge of General Education information will improve Description: Including pain rating scale, medication(s)/side effects and non-pharmacologic comfort measures Outcome: Not Progressing   Problem: Health Behavior/Discharge Planning: Goal: Ability to manage health-related needs will improve Outcome: Not Progressing   Problem: Clinical Measurements: Goal: Diagnostic test results will improve Outcome: Not Progressing Goal: Respiratory complications will improve Outcome: Not Progressing Goal: Cardiovascular complication will be avoided Outcome: Not Progressing   Problem: Activity: Goal: Risk for activity intolerance will decrease Outcome: Not Progressing   Problem: Nutrition: Goal: Adequate nutrition will be maintained Outcome: Not Progressing   Problem: Elimination: Goal: Will not experience complications related to bowel motility Outcome: Not Progressing   Problem: Skin Integrity: Goal: Risk for impaired skin integrity will decrease Outcome: Not Progressing   Problem: Nutritional: Goal: Maintenance of adequate nutrition will improve Outcome: Not Progressing

## 2023-06-18 NOTE — Plan of Care (Deleted)
  Problem: Clinical Measurements: Goal: Respiratory complications will improve Outcome: Not Progressing   Problem: Nutrition: Goal: Adequate nutrition will be maintained Outcome: Not Progressing   Problem: Elimination: Goal: Will not experience complications related to bowel motility Outcome: Not Progressing Goal: Will not experience complications related to urinary retention Outcome: Not Progressing   Problem: Skin Integrity: Goal: Risk for impaired skin integrity will decrease Outcome: Not Progressing

## 2023-06-19 ENCOUNTER — Other Ambulatory Visit (HOSPITAL_COMMUNITY): Payer: Self-pay

## 2023-06-19 DIAGNOSIS — J168 Pneumonia due to other specified infectious organisms: Secondary | ICD-10-CM | POA: Diagnosis not present

## 2023-06-19 DIAGNOSIS — B49 Unspecified mycosis: Secondary | ICD-10-CM | POA: Diagnosis not present

## 2023-06-19 LAB — BASIC METABOLIC PANEL
Anion gap: 8 (ref 5–15)
BUN: 55 mg/dL — ABNORMAL HIGH (ref 8–23)
CO2: 24 mmol/L (ref 22–32)
Calcium: 8.7 mg/dL — ABNORMAL LOW (ref 8.9–10.3)
Chloride: 102 mmol/L (ref 98–111)
Creatinine, Ser: 1.23 mg/dL (ref 0.61–1.24)
GFR, Estimated: 57 mL/min — ABNORMAL LOW (ref >60)
Glucose, Bld: 138 mg/dL — ABNORMAL HIGH (ref 70–99)
Potassium: 5.8 mmol/L — ABNORMAL HIGH (ref 3.5–5.1)
Sodium: 134 mmol/L — ABNORMAL LOW (ref 135–145)

## 2023-06-19 LAB — CBC
HCT: 38.9 % — ABNORMAL LOW (ref 39.0–52.0)
Hemoglobin: 12.4 g/dL — ABNORMAL LOW (ref 13.0–17.0)
MCH: 28.8 pg (ref 26.0–34.0)
MCHC: 31.9 g/dL (ref 30.0–36.0)
MCV: 90.5 fL (ref 80.0–100.0)
Platelets: 383 10*3/uL (ref 150–400)
RBC: 4.3 MIL/uL (ref 4.22–5.81)
RDW: 14.7 % (ref 11.5–15.5)
WBC: 18.9 10*3/uL — ABNORMAL HIGH (ref 4.0–10.5)
nRBC: 0 % (ref 0.0–0.2)

## 2023-06-19 LAB — ASPERGILLUS ANTIBODY BY IMMUNODIFF
Aspergillus flavus: NEGATIVE
Aspergillus fumigatus, IgG: NEGATIVE
Aspergillus niger: NEGATIVE

## 2023-06-19 LAB — MAGNESIUM: Magnesium: 3.1 mg/dL — ABNORMAL HIGH (ref 1.7–2.4)

## 2023-06-19 MED ORDER — GLYCOPYRROLATE 0.2 MG/ML IJ SOLN: 0.2000 mg | INTRAMUSCULAR | Status: DC | PRN

## 2023-06-19 MED ORDER — ACETAMINOPHEN 650 MG RE SUPP
650.0000 mg | Freq: Four times a day (QID) | RECTAL | Status: DC | PRN
Start: 2023-06-19 — End: 2023-06-19

## 2023-06-19 MED ORDER — SODIUM CHLORIDE 0.9% FLUSH: 3.0000 mL | INTRAVENOUS | Status: DC | PRN

## 2023-06-19 MED ORDER — ACETAMINOPHEN 325 MG PO TABS
650.0000 mg | ORAL_TABLET | Freq: Four times a day (QID) | ORAL | Status: DC | PRN
Start: 2023-06-19 — End: 2023-06-19

## 2023-06-19 MED ORDER — SODIUM CHLORIDE 0.9% FLUSH: 3.0000 mL | Freq: Two times a day (BID) | INTRAVENOUS | Status: DC

## 2023-06-19 MED ORDER — MORPHINE 100MG IN NS 100ML (1MG/ML) PREMIX INFUSION
0.0000 mg/h | INTRAVENOUS | Status: DC
  Administered 2023-06-19: 5 mg/h via INTRAVENOUS
  Filled 2023-06-19: qty 100

## 2023-06-19 MED ORDER — MORPHINE BOLUS VIA INFUSION
5.0000 mg | INTRAVENOUS | Status: DC | PRN
  Administered 2023-06-19 (×3): 5 mg via INTRAVENOUS

## 2023-06-19 MED ORDER — GLYCOPYRROLATE 1 MG PO TABS: 1.0000 mg | ORAL_TABLET | ORAL | Status: DC | PRN

## 2023-06-19 MED ORDER — POLYVINYL ALCOHOL 1.4 % OP SOLN: 1.0000 [drp] | Freq: Four times a day (QID) | OPHTHALMIC | Status: DC | PRN

## 2023-06-21 NOTE — Final Progress Note (Signed)
Chaplain provided emotional and spiritual support, alongside Dr. Patterson Hammersmith from Palliative care, around decision-making.  Chaplain provided support individually to Corey [name]'s son and daughter as well as his wife and other family members.  Corey [name]'s wife is having a difficult time and doesn't want to be without him.  She doesn't know how she will live on her own.  She has good support including from her sister and her children.  The couple lost their daughter, [name] at the age of 95.  Although his wife does not want him to go, she was able to say to him, "If you see [name] before I do, tell her I love her."  Chaplain provided active listening as well as anticipatory grief support.

## 2023-06-28 ENCOUNTER — Other Ambulatory Visit (HOSPITAL_COMMUNITY): Payer: Medicare Other

## 2023-06-30 NOTE — Progress Notes (Signed)
This RN spoke with CCM via secure chat about labs being drawn. Made CCM aware that per family request and Dr. Patterson Hammersmith, DO progress note, the family has declined to have any further labs drawn at this time. This RN & CCM discussed and a note was placed in patient's chart.

## 2023-06-30 NOTE — Progress Notes (Signed)
Daily Progress Note   Patient Name: Corey [name]       Date:  DOB: 1935/05/26  Age: 89 y.o. MRN#: [id number] Attending Physician: Jonah Blue, MD Primary Care Physician: Emilio Aspen, MD Admit Date: 06/07/2023 Length of Stay: 11 days  Reason for Consultation/Follow-up: Establishing goals of care  Subjective:   CC: Patient's work of breathing increased today and he appears visibly distressed and dyspneic.  Following up regarding complex medical decision making.  Subjective:  Reviewed EMR prior to presenting to bedside.  Patient has been receiving oral morphine scheduled 7.5 mg every 4 hours scheduled.  Patient also has morphine IV PRN.     Discussed care with bedside RN for medical updates.     When seeing patient this morning, he is very dyspneic appearing. Patient appears ill and exhausted.  Patient's wife, daughter, brother and sister in law at bedside.   Chaplain follow up and assistance to the family at this time is appreciated, chart reviewed.     Objective:   Vital Signs:  BP 115/60   Pulse 71   Temp 98.6 F (37 C) (Axillary)   Resp (!) 27   Ht 6\' 1"  (1.854 m)   Wt 90.1 kg   SpO2 91%   BMI 26.21 kg/m   Physical Exam: General: NAD, appears tired, laying in bed, ill appearing  HENT: HFNC in place Cardiovascular: RRR Respiratory: increased work of breathing noted, on HFNC, monitor noted.  Neuro: becoming lethargic   Imaging: I personally reviewed recent imaging.   Assessment & Plan:   Assessment: Patient is an 89 year old male with a past medical history of A-fib on Eliquis, BPH, and recently diagnosed stage IV metastatic neuroendocrine tumor with unknown primary who was admitted on 06/07/2023 for management of weakness and shortness of breath.  Patient had recently been started Afinitor for cancer management.  This was discontinued as patient was unable to tolerate in setting of significant fatigue and cough.  Patient was referred to  Dr. Ty Hilts for [facility] therapy though has been hospitalized and unable to attend this appointment.  During hospitalization patient has had worsening acute respiratory failure with noted hypoxia.  Pulmonology following to assist with management.  Oncology following in setting of metastatic neuroendocrine tumor.  Cardiology consulted for concerns of cardiogenic edema.  Palliative medicine team consulted to assist with complex medical decision making.   Recommendations/Plan: # Complex medical decision making/goals of care:     -Patient becoming more dyspneic,fatigued and lethargic due to his multiple medical conditions and increased work of breathing despite appropriate medical interventions.       Code Status: Limited: Do not attempt resuscitation (DNR) -DNR-LIMITED -Do Not Intubate/DNI   # Symptom management Patient is receiving these palliative interventions for symptom management with an intent to improve quality of life.                 -Dyspnea, in setting of acute hypoxic respiratory failure possibly multifactorial   -Continue oral morphine solution to 7.5 mg every 4 hours scheduled.  Patient can always refuse.  Hold for respiratory rate less than 12.                              - morphine oral solution to 10 mg every 2 hours as needed   - IV morphine 2 mg every 2 hours as needed for breakthrough management after oral morphine or if patient on BiPAP overnight and unable  to take oral medications                               -Continue morphine for inhalation 10 mg every 3 hours as needed.  Can use oral morphine and inhaled morphine since inhaled morphine does not cause systemic effects.      # Psycho-social/Spiritual Support:  - Support System: wife, brother, niece, cousin  # Discharge Planning: To Be Determined  Discussed with: Patient,   RN, patient's family including wife and daughter  Thank you for allowing the palliative care team to participate in the care [name].  Mod MDM Rosalin Hawking MD Palliative Care Provider PMT # [phone]  If patient remains symptomatic despite maximum doses, please call PMT at [phone] between 0700 and 1900. Outside of these hours, please call attending, as PMT does not have night coverage.

## 2023-06-30 NOTE — Progress Notes (Addendum)
Pt states he does not feel like taking any oral meds this morning. Night shift RN stated he was doing this towards end of their shift as well. MD made aware. Will take occasional sips of water.

## 2023-06-30 NOTE — Death Summary Note (Signed)
DEATH SUMMARY   Patient Details  Name: FENRIS CAUBLE MRN: [id number] DOB: January 25, 1935 WUJ:[id number], Sherie Don, MD Admission/Discharge Information   Admit Date:  Jun 09, 2023  Date of Death: Date of Death: 06/21/2023  Time of Death: Time of Death: 1950  Length of Stay: July 12, 2023   Principle Cause of death: Community-acquired and fungal pneumonia in the setting of stage 4 neuroendocrine cancer  Hospital Diagnoses: Principal Problem:   Fungal pneumonia Active Problems:   Pneumonia of both lungs due to infectious organism   Acute respiratory failure with hypoxia [facility])   Neuroendocrine tumor   Acute pulmonary edema (HCC)   Palliative care encounter   Goals of care, counseling/discussion   Counseling and coordination of care   Medication management   Malignant neoplasm metastatic to liver (HCC)   Protein-calorie malnutrition, severe   Metastatic malignant neoplasm [facility])   Need for emotional support   Hospital Course: 89yo with h/o afib on Eliquis, BPH, and recently diagnosed (03/2023) stage IV metastatic neuroendocrine tumor with unknown primary who presented on 06-09-23 with weakness and SOB.  He was recently started on Afinitor but was unable to tolerate it due to significant fatigue and cough.  He was awaiting consultation with Dr. Amil Amen for Lutathera therapy but missed his appointment because of this hospitalization.  He saw his oncologist on 1/6 and CXR showed pneumonia.  He was then seen by his cardiologist on 1/7 and there was concern for heart failure so he was started on Lasix.  He had a good response to Lasix and his peripheral edema resolved however his cough and dyspnea continued.  Given this he presented to the ED and further workup showed evidence of progressive pulmonary consolidation.  He was started on azithromycin and Rocephin and repeat CXR showed "Interval worsening multifocal left lung pneumonia". Chest CT showed no evidence of PE but did show possible pulmonary edema.   He was given IV Solu-Medrol and a dose of IV Lasix and cardiology has been consulted.  Pulmonary added IV steroids 125 mg every 12 for 5 more doses.  Respiratory status worsened on 1/14 and he was transferred to the stepdown unit and placed on heated high flow nasal cannula 60 L.  Antibiotics escalated to cefepime and pulmonary added Bactrim.  Despite aggressive interventions, he continued to worsen from a respiratory standpoint. He was unable to tolerate and unable to wean off HFNC O2.  Eventually, in the face of continued decline, family agreed to transition to comfort measures and he died peacefully within a few hours.  Assessment and Plan:  Acute Respiratory Failure with Hypoxia  Presented with acute respiratory failure with hypoxia and had a reported pulse oximetry of 85% on room air at home Ddx includes PNA, pneumonitis (had 1 dose of everolimus recently), cardiogenic edema Most likely etiology of his hypoxia due to community-acquired pneumonia; PE was considered in the setting of his malignancy but is unlikely given that the patient is already anticoagulated Eliquis  He acutely decompensated on 1/14 and was transferred to SDU, is currently on 60L HFNC O2, FiO2 80% Repeat CXR on 1/15 showed persistent B airspace opacities, L > R, unchanged Pulmonary consulted Given Solumedrol -> prednisone and Lasix  Also added Xopenex/Atrovent and also on Budesonide 0.25 mg twice daily Remained on HFNC O2 (unable to tolerate BIPAP) Continued to have progressive SOB and having air hunger issues Morphine added by palliative care for air hunger Precedex helped air hunger  Based on poor prognosis family eventually agreed to proceed with  comfort care measures  He died peacefully with family at the bedside within a few hours   Fungal Pneumonia Has had progressive cough, hypoxia, multilobar pneumonia seen on chest x-ray and has been progressive when compared to x-ray from 06/04/2023 No fever, leukocytosis, or  other evidence of sepsis Completed Azithromycin/Ceftriaxone, s/p 1 dose of Vanc, started on Cefepime and Bactrim on 1/14 Started on Solumedrol 120 mg IV q12 x 3 days with plan for extended steroid taper with prednisone (60 mg starting 1/16) Incentive spirometer, and flutter valve and Guaifenesin CXR with no interval change, persistent L > R airspace opacities Fungitell was positive, started on Micafungin without improvement   Acute on Chronic Diastolic CHF He had responded well to several days of p.o. Lasix, no longer needed Echocardiogram 1/9 with EF of 50-55% with grade 1 diastolic dysfunction    Bacteremia, factitious Patient grew Staph hominis in 1 culture but other culture negative and repeat cultures negative x 5 days Very likely contaminant   Stage IV Neuroendocrine Tumor Dr. Mosetta Putt consulted on 1/13 and recommended continued antibiotics for now Started on Everolimus (Afinitor) on 12/18, stopped on 1/6 due to poor tolerance He was planning to start bimonthly infusions with Lutathera next Palliative care consulted Dr. Mosetta Putt saw him and agreed that he was unlikely to survive current illness    DNR/GOC Code status changed after palliative consultation on 1/17  Patient had continued daily deterioration Methylnaltrexone and Precedex were added for comfort After ongoing discussions, family agreed to transition to comfort measures and the patient died peacefully a few hours later       Consultants: Oncology Pulmonology Cardiology Palliative care [place] team DM coordinator   Procedures: Echocardiogram 1/9   Antibiotics: Azithromycin 1/9-13 Ceftriaxone 1/9-14 Cefepime 1/14- Bactrim 1/14- Vancomycin x 1 Micafungin 1/16-    The results of significant diagnostics from this hospitalization (including imaging, microbiology, ancillary and laboratory) are listed below for reference.   Significant Diagnostic Studies: DG CHEST PORT 1 VIEW Result Date: 06/17/2023 CLINICAL DATA:   Fungal pneumonia EXAM: PORTABLE CHEST 1 VIEW COMPARISON:  06/15/2023 FINDINGS: Artifact overlies the chest. Widespread pulmonary infiltrates persist, more extensive in the left lung than the right. Allowing for technical differences, no significant change since two days ago. No visible effusion. IMPRESSION: No significant change since two days ago. Widespread pulmonary infiltrates, more extensive in the left lung than the right. Electronically Signed   By: Paulina Fusi M.D.   On: 06/17/2023 11:04   DG Abd 1 View Result Date: 06/17/2023 CLINICAL DATA:  Abdominal distension EXAM: ABDOMEN - 1 VIEW COMPARISON:  03/21/2023 FINDINGS: Diffuse bowel gas without obstructive pattern or abnormal stool retention. Leftward displacement of the gastric bubble related to liver enlargement by prior CT. Cholecystectomy clips. No concerning calcification. IMPRESSION: Stable when compared to abdominal CT 03/21/2023. Nonobstructive bowel gas. Electronically Signed   By: Tiburcio Pea M.D.   On: 06/17/2023 10:35   DG CHEST PORT 1 VIEW Result Date: 06/15/2023 CLINICAL DATA:  Dyspnea. EXAM: PORTABLE CHEST 1 VIEW COMPARISON:  06/14/2023 and older studies. FINDINGS: Hazy, bilateral, left greater than right, airspace opacities are without significant change from the previous day's study. No new lung abnormalities. No convincing pleural effusion.  No pneumothorax. Cardiac silhouette normal in size. IMPRESSION: 1. No interval change. Persistent, left greater than right airspace lung opacities. Electronically Signed   By: Amie Portland M.D.   On: 06/15/2023 08:01   US Abdomen Limited RUQ (LIVER/GB) Result Date: 06/15/2023 CLINICAL DATA:  Elevated liver enzymes.  Previous PET-CT describes neuroendocrine tumor with liver metastatic disease. EXAM: ULTRASOUND ABDOMEN LIMITED RIGHT UPPER QUADRANT COMPARISON:  PET-CT, 04/16/2023. CT chest, abdomen and pelvis, 03/21/2023. FINDINGS: Gallbladder: Surgically absent Common bile duct: Diameter: 4  mm, not well visualized. Liver: Numerous masses throughout the entire liver. Masses relatively hyperechoic, with additional areas of relative decreased echogenicity. Relatively discrete hyperechoic mass posterior right lobe measures 8.5 x 8 point by 10.1 cm. Second mixed echogenicity, but predominantly hyperechoic mass in the left lobe measures 8.0 x 5.8 x 6 cm. Heterogeneous, hepatopetal flow noted in the portal vein. Other: None. IMPRESSION: 1. Extensive metastatic disease throughout the liver. This is similar to the extensive disease evident on the prior PET-CT. 2. No acute findings.  No bile duct dilation. Electronically Signed   By: Amie Portland M.D.   On: 06/15/2023 08:00   DG CHEST PORT 1 VIEW Result Date: 06/14/2023 CLINICAL DATA:  Dyspnea. EXAM: PORTABLE CHEST 1 VIEW COMPARISON:  June 13, 2023. FINDINGS: Stable cardiomediastinal silhouette. Stable bilateral lung opacities are noted, left greater than right, concerning for multifocal pneumonia or possibly edema. Bony thorax is unremarkable. IMPRESSION: Stable bilateral lung opacities as noted above. Electronically Signed   By: Lupita Raider M.D.   On: 06/14/2023 10:49   DG CHEST PORT 1 VIEW Result Date: 06/13/2023 CLINICAL DATA:  141880 SOB (shortness of breath) 141880 EXAM: PORTABLE CHEST 1 VIEW COMPARISON:  06/12/2023 FINDINGS: Stable cardiomediastinal contours. Aortic atherosclerosis. Persistent bilateral airspace opacities, left worse than right, not appreciably changed. No pleural fluid collection. No pneumothorax. IMPRESSION: Persistent bilateral airspace opacities, left worse than right, not appreciably changed. Electronically Signed   By: Duanne Guess D.O.   On: 06/13/2023 09:26   DG CHEST PORT 1 VIEW Result Date: 06/12/2023 CLINICAL DATA:  Shortness of breath EXAM: PORTABLE CHEST 1 VIEW COMPARISON:  06/11/2023 FINDINGS: Cardiomediastinal contours are stable. Aortic atherosclerosis. Bilateral airspace disease, left greater than  right, similar to prior study. No visible effusions or pneumothorax. No acute bony abnormality. IMPRESSION: Stable left greater than right bilateral airspace opacities. Electronically Signed   By: Charlett Nose M.D.   On: 06/12/2023 09:30   DG CHEST PORT 1 VIEW Result Date: 06/11/2023 CLINICAL DATA:  Community acquired pneumonia.  Shortness of breath. EXAM: PORTABLE CHEST 1 VIEW COMPARISON:  Chest radiograph dated 06/09/2023. FINDINGS: No significant interval change in the bilateral pulmonary opacities, left greater than right. No pleural effusion or pneumothorax. Stable cardiac silhouette no acute osseous pathology. IMPRESSION: No significant interval change in the bilateral pulmonary opacities, left greater than right. Electronically Signed   By: Elgie Collard M.D.   On: 06/11/2023 10:21   CT Angio Chest Pulmonary Embolism (PE) W or WO Contrast Result Date: 06/10/2023 CLINICAL DATA:  Chronic dyspnea, neuroendocrine tumor with hepatic metastatic disease. * Tracking Code: BO * EXAM: CT ANGIOGRAPHY CHEST WITH CONTRAST TECHNIQUE: Multidetector CT imaging of the chest was performed using the standard protocol during bolus administration of intravenous contrast. Multiplanar CT image reconstructions and MIPs were obtained to evaluate the vascular anatomy. RADIATION DOSE REDUCTION: This exam was performed according to the departmental dose-optimization program which includes automated exposure control, adjustment of the mA and/or kV according to patient size and/or use of iterative reconstruction technique. CONTRAST:  75mL OMNIPAQUE IOHEXOL 350 MG/ML SOLN COMPARISON:  PET-CT 04/16/2023 and CT chest 03/21/2023 FINDINGS: Despite efforts by the technologist and patient, motion artifact is present on today's exam and could not be eliminated. This reduces exam sensitivity and specificity. Cardiovascular: No filling defect  is identified in the pulmonary arterial tree to suggest pulmonary embolus. Coronary, aortic arch,  and branch vessel atherosclerotic vascular disease. Mild cardiomegaly. Mediastinum/Nodes: Upper prevascular node 1.1 cm on image 36 series 4, mildly enlarged, previously measuring 0.8 cm. Left supraclavicular node 1.2 cm in short axis on image 30 series 4, formerly 1.0 cm. Mild right paratracheal adenopathy as well. Mildly dilated esophagus with air-fluid level. Lungs/Pleura: Bilateral airspace opacities favoring acute pulmonary edema. Small left pleural effusion. Upper Abdomen: Hepatomegaly. Heterogeneous density probably reflecting metastatic disease. Pseudo cirrhosis contour. Musculoskeletal: No acute bony findings. Review of the MIP images confirms the above findings. IMPRESSION: 1. No filling defect is identified in the pulmonary arterial tree to suggest pulmonary embolus. 2. Bilateral airspace opacities favoring acute pulmonary edema. Small left pleural effusion. 3. Mild cardiomegaly. 4. Hepatomegaly with heterogeneous density probably reflecting metastatic disease. Pseudocirrhosis contour. 5. Mildly enlarged mediastinal and left supraclavicular nodes, mildly increased in size from prior. Congestion as a potential cause. 6. Mildly dilated esophagus with air-fluid level. 7. Aortic atherosclerosis. Aortic Atherosclerosis (ICD10-I70.0). Electronically Signed   By: Gaylyn Rong M.D.   On: 06/10/2023 17:07   DG CHEST PORT 1 VIEW Result Date: 06/09/2023 CLINICAL DATA:  89 year old male with shortness of breath. Cough and evidence of left lung pneumonia on radiographs. EXAM: PORTABLE CHEST 1 VIEW COMPARISON:  Portable chest yesterday and earlier. FINDINGS: Portable AP semi upright view at 0602 hours. Progressive confluence of widespread abnormal left lung opacity, especially above the left hilum now. Stable lung volumes. Stable cardiac size and mediastinal contours. Right lung remains less affected, there is mild patchy peripheral right mid lung opacity near the minor fissure. No pleural effusion. No  pneumothorax. As is fissure (normal variant). Calcified aortic atherosclerosis. Paucity of bowel gas. Stable visualized osseous structures. IMPRESSION: 1. Further radiographic progression of multilobar Left lung pneumonia. 2. Comparatively mild right lung involvement.  No pleural effusion. Electronically Signed   By: Odessa Fleming M.D.   On: 06/09/2023 08:24   DG CHEST PORT 1 VIEW Result Date: 06/08/2023 CLINICAL DATA:  141880 SOB (shortness of breath) 141880 EXAM: PORTABLE CHEST 1 VIEW COMPARISON:  Chest x-ray 06/07/2023, PET CT 04/16/2023, CT chest 03/21/2023 FINDINGS: The heart and mediastinal contours are unchanged. Atherosclerotic plaque. Low lung volumes. Azygous fissure incidentally noted. Interval increase in patchy airspace opacities of the left lung. Coarsened interstitial markings with cystic changes. No pulmonary edema. No pleural effusion. No pneumothorax. No acute osseous abnormality. IMPRESSION: 1. Interval worsening multifocal left lung pneumonia. Low lung volumes with right lung involvement not fully excluded. Followup PA and lateral chest X-ray is recommended in 3-4 weeks following therapy to ensure resolution. 2. Aortic Atherosclerosis (ICD10-I70.0) and Emphysema (ICD10-J43.9). Electronically Signed   By: Tish Frederickson M.D.   On: 06/08/2023 10:12   ECHOCARDIOGRAM COMPLETE Result Date: 06/07/2023    ECHOCARDIOGRAM REPORT   Patient Name:   NUH LIPTON [facility] Date of Exam: 06/07/2023 Medical Rec #:  [id number]             Height:       72.0 in Accession #:    [id number]            Weight:       193.6 lb Date of Birth:  Oct 05, 1934             BSA:          2.101 m Patient Age:    89 years  BP:           133/73 mmHg Patient Gender: M                     HR:           68 bpm. Exam Location:  Inpatient Procedure: 2D Echo, Cardiac Doppler and Color Doppler Indications:    CHF- Acute Systolic  History:        Patient has prior history of Echocardiogram examinations, most                 recent  07/28/2015. Risk Factors:Hypertension and Former Smoker.  Sonographer:    Karma Ganja Referring Phys: [id number] MIR M [facility] IMPRESSIONS  1. Left ventricular ejection fraction, by estimation, is 50 to 55%. Left ventricular ejection fraction by 2D MOD biplane is 50.5 %. The left ventricle has low normal function. The left ventricle has no regional wall motion abnormalities. Left ventricular diastolic parameters are consistent with Grade I diastolic dysfunction (impaired relaxation).  2. Right ventricular systolic function is mildly reduced. The right ventricular size is normal.  3. The mitral valve is grossly normal. Trivial mitral valve regurgitation.  4. The aortic valve is tricuspid. Aortic valve regurgitation is not visualized. Aortic valve sclerosis/calcification is present, without any evidence of aortic stenosis.  5. The inferior vena cava is normal in size with greater than 50% respiratory variability, suggesting right atrial pressure of 3 mmHg. Comparison(s): Prior images unable to be directly viewed, comparison made by report only. Changes from prior study are noted. 07/28/2015: LVEF 50-55%. FINDINGS  Left Ventricle: Left ventricular ejection fraction, by estimation, is 50 to 55%. Left ventricular ejection fraction by 2D MOD biplane is 50.5 %. The left ventricle has low normal function. The left ventricle has no regional wall motion abnormalities. The left ventricular internal cavity size was normal in size. There is no left ventricular hypertrophy. Left ventricular diastolic parameters are consistent with Grade I diastolic dysfunction (impaired relaxation). Indeterminate filling pressures. Right Ventricle: The right ventricular size is normal. No increase in right ventricular wall thickness. Right ventricular systolic function is mildly reduced. Left Atrium: Left atrial size was normal in size. Right Atrium: Right atrial size was normal in size. Pericardium: There is no evidence of pericardial effusion.  Mitral Valve: The mitral valve is grossly normal. Trivial mitral valve regurgitation. Tricuspid Valve: The tricuspid valve is grossly normal. Tricuspid valve regurgitation is trivial. Aortic Valve: The aortic valve is tricuspid. Aortic valve regurgitation is not visualized. Aortic valve sclerosis/calcification is present, without any evidence of aortic stenosis. Aortic valve mean gradient measures 5.0 mmHg. Aortic valve peak gradient measures 8.4 mmHg. Aortic valve area, by VTI measures 1.91 cm. Pulmonic Valve: The pulmonic valve was normal in structure. Pulmonic valve regurgitation is not visualized. Aorta: The aortic root and ascending aorta are structurally normal, with no evidence of dilitation. Venous: The inferior vena cava is normal in size with greater than 50% respiratory variability, suggesting right atrial pressure of 3 mmHg. IAS/Shunts: No atrial level shunt detected by color flow Doppler.  LEFT VENTRICLE PLAX 2D                        Biplane EF (MOD) LVIDd:         5.30 cm         LV Biplane EF:   Left LVIDs:         3.70 cm  ventricular LV PW:         1.00 cm                          ejection LV IVS:        0.80 cm                          fraction by LVOT diam:     2.00 cm                          2D MOD LV SV:         57                               biplane is LV SV Index:   27                               50.5 %. LVOT Area:     3.14 cm                                Diastology                                LV e' medial:    5.98 cm/s LV Volumes (MOD)               LV E/e' medial:  8.0 LV vol d, MOD    129.2 ml      LV e' lateral:   11.60 cm/s A2C:                           LV E/e' lateral: 4.1 LV vol d, MOD    125.8 ml A4C: LV vol s, MOD    64.1 ml A2C: LV vol s, MOD    58.0 ml A4C: LV SV MOD A2C:   65.2 ml LV SV MOD A4C:   125.8 ml LV SV MOD BP:    56.6 ml RIGHT VENTRICLE RV Basal diam:  3.40 cm RV S prime:     7.94 cm/s TAPSE (M-mode): 1.6 cm LEFT ATRIUM              Index        RIGHT ATRIUM           Index LA diam:        3.40 cm 1.62 cm/m   RA Area:     14.10 cm LA Vol (A2C):   67.0 ml 31.88 ml/m  RA Volume:   29.60 ml  14.09 ml/m LA Vol (A4C):   37.2 ml 17.70 ml/m LA Biplane Vol: 48.8 ml 23.22 ml/m  AORTIC VALVE AV Area (Vmax):    2.16 cm AV Area (Vmean):   1.90 cm AV Area (VTI):     1.91 cm AV Vmax:           145.00 cm/s AV Vmean:          102.000 cm/s AV VTI:            0.296 m AV Peak Grad:      8.4 mmHg AV Mean Grad:  5.0 mmHg LVOT Vmax:         99.90 cm/s LVOT Vmean:        61.600 cm/s LVOT VTI:          0.180 m LVOT/AV VTI ratio: 0.61  AORTA Ao Root diam: 3.40 cm MITRAL VALVE MV Area (PHT): 2.34 cm    SHUNTS MV Decel Time: 324 msec    Systemic VTI:  0.18 m MV E velocity: 47.60 cm/s  Systemic Diam: 2.00 cm MV A velocity: 74.10 cm/s MV E/A ratio:  0.64 Zoila Shutter MD Electronically signed by Zoila Shutter MD Signature Date/Time: 06/07/2023/5:20:08 PM    Final    DG Chest 2 View Result Date: 06/07/2023 CLINICAL DATA:  Cough. EXAM: CHEST - 2 VIEW COMPARISON:  06/04/2023. FINDINGS: Increasing heterogeneous opacity predominantly overlying the left mid lower lung zone, laterally, compatible with left lung lower lobe pneumonia. There also faint heterogeneous opacities overlying the right lung, laterally, also concerning for developing pneumonia. Follow-up to clearing is recommended. Bilateral costophrenic angles are clear. No pneumothorax. Note is made of elevated right hemidiaphragm. Stable cardio-mediastinal silhouette. No acute osseous abnormalities. The soft tissues are within normal limits. IMPRESSION: *Multilobar pneumonia, as described above. Follow-up to clearing is recommended. Electronically Signed   By: Jules Schick M.D.   On: 06/07/2023 09:03   DG Chest 2 View Result Date: 06/04/2023 CLINICAL DATA:  Cough.  Productive cough EXAM: CHEST - 2 VIEW COMPARISON:  08/10/2022 FINDINGS: Normal cardiac silhouette. There is patchy bilateral airspace  disease. No pleural fluid. No pneumothorax. IMPRESSION: Mild patchy airspace disease suggests early multifocal pneumonia versus pulmonary edema. These results will be called to the ordering clinician or representative by the Radiologist Assistant, and communication documented in the PACS or [employer]. Electronically Signed   By: Genevive Bi M.D.   On: 06/04/2023 16:46    Microbiology: No results found for this or any previous visit (from the past 240 hours).  Time spent: <30 minutes  Signed: Jonah Blue, MD 06/12/2023

## 2023-06-30 NOTE — Progress Notes (Signed)
Patient removed from [facility] at this time and placed on 2L [place] per order for comfort care. Family and RN at bedside.

## 2023-06-30 NOTE — Progress Notes (Signed)
Family requesting transition to comfort Morphine infusion and bolus order placed.   Simonne Martinet ACNP-BC [facility] Pulmonary/Critical Care Pager # [phone] OR # [phone] if no answer

## 2023-06-30 NOTE — Progress Notes (Signed)
    1700  Spiritual Encounters  Type of Visit Follow up  Care provided to: Pt and family  Conversation partners present during encounter Nurse  Referral source Patient request;Chaplain assessment  Reason for visit Routine spiritual support  OnCall Visit No  Spiritual Framework  Presenting Themes Meaning/purpose/sources of inspiration;Goals in life/care;Values and beliefs;Significant life change;Caregiving needs;Impactful experiences and emotions;Courage hope and growth;Rituals and practive;Community and relationships  Community/Connection Family;Faith community;Spiritual leader  Patient Stress Factors Health changes;Loss of control;Major life changes  Family Stress Factors Loss of control;Loss;Other (Comment) (Addressing their Anticipatory Grief)  Interventions  Spiritual Care Interventions Made Established relationship of care and support;Compassionate presence;Reflective listening;Normalization of emotions;Explored ethical dilemma;Decision-making support/facilitation;Narrative/life review;Reconciliation with self/others;Meaning making;Bereavement/grief support;Prayer  Intervention Outcomes  Outcomes Connection to spiritual care;Awareness of support;Reduced anxiety   Chaplain met with patient along with Chaplain Resident.  Met with staff and aided family in addressing their Anticipatory Grief.  Provided emotional and spiritual counsel and support.  Prayed with family and ended visit with a departing blessing.

## 2023-06-30 NOTE — Plan of Care (Signed)
  Problem: Safety: Goal: Ability to remain free from injury will improve Outcome: Progressing   Problem: Coping: Goal: Ability to adjust to condition or change in health will improve Outcome: Progressing   Problem: Clinical Measurements: Goal: Respiratory complications will improve Outcome: Not Progressing   Problem: Activity: Goal: Risk for activity intolerance will decrease Outcome: Not Progressing   Problem: Nutrition: Goal: Adequate nutrition will be maintained Outcome: Not Progressing   Problem: Coping: Goal: Level of anxiety will decrease Outcome: Not Progressing

## 2023-06-30 NOTE — Progress Notes (Signed)
    OVERNIGHT PROGRESS REPORT  Notified by RN that patient is deceased as of 1950 Hrs.  Patient was DNR/CMO (comfort care) followed by Palliative   2 RN verified.  Family was immediately available to RNs at bedside.     Chinita Greenland MSNA MSN ACNPC-AG Acute Care Nurse Practitioner [facility]

## 2023-06-30 NOTE — Progress Notes (Signed)
Progress Note   Patient: Corey [name] WGN:[id number] DOB: [birth year] DOA: 06/07/2023     11 DOS: the patient was seen and examined on    Brief hospital course: 89yo with h/o afib on Eliquis, BPH, and recently diagnosed (03/2023) stage IV metastatic neuroendocrine tumor with unknown primary who presented on 1/9 with weakness and SOB.  He was recently started on Afinitor but was unable to tolerate it due to significant fatigue and cough.  He was awaiting consultation with Dr. Amil Amen for Lutathera therapy but missed his appointment because of this hospitalization.  He saw his oncologist on 1/6 and CXR showed pneumonia.  He was then seen by his cardiologist on 1/7 and there was concern for heart failure so he was started on Lasix.  He had a good response to Lasix and his peripheral edema resolved however his cough and dyspnea continued.  Given this he presented to the ED and further workup showed evidence of progressive pulmonary consolidation.  He was started on azithromycin and Rocephin and repeat CXR showed "Interval worsening multifocal left lung pneumonia". Chest CT showed no evidence of PE but did show possible pulmonary edema.  He was given IV Solu-Medrol and a dose of IV Lasix and cardiology has been consulted.  Pulmonary added IV steroids 125 mg every 12 for 5 more doses.  Respiratory status worsened on 1/14 and he was transferred to the stepdown unit and placed on heated high flow nasal cannula 60 L.  Antibiotics escalated to cefepime and pulmonary added Bactrim.  Inflammatory markers being checked and cardiology give another dose of diuresis.    Assessment and Plan:  Acute Respiratory Failure with Hypoxia  Presented with acute respiratory failure with hypoxia and had a reported pulse oximetry of 85% on room air at home Ddx includes PNA, pneumonitis (had 1 dose of everolimus recently), cardiogenic edema Most likely etiology of his hypoxia due to community-acquired pneumonia; PE  was considered in the setting of his malignancy but is unlikely given that the patient is already anticoagulated Eliquis  He acutely decompensated on 1/14 and was transferred to SDU, is currently on 60L HFNC O2, FiO2 80% Repeat CXR on 1/15 showed persistent B airspace opacities, L > R, unchanged Pulmonary consulted Given Solumedrol -> prednisone and Lasix (now off) Also added Xopenex/Atrovent and also on Budesonide 0.25 mg twice daily Remains on HFNC O2 (unable to tolerate BIPAP) He remains SOB and having air hunger issues Morphine added by palliative care for air hunger Precedex helped air hunger but he had bradycardia; starting back at lower dose Appears to be continuing to decline, overall poor prognosis at this time Awaiting wife's willingness to proceed with comfort care measures vs. Patient decompensation - recovery appears to be unlikely at this time and he appears to be suffering Using morphine to alleviate symptoms as much as possible   Fungal Pneumonia Has had progressive cough, hypoxia, multilobar pneumonia seen on chest x-ray and has been progressive when compared to x-ray from 06/04/2023 No fever, leukocytosis, or other evidence of sepsis Completed Azithromycin/Ceftriaxone, s/p 1 dose of Vanc, started on Cefepime and Bactrim on 1/14 Started on Solumedrol 120 mg IV q12 x 3 days with plan for extended steroid taper with prednisone (60 mg starting 1/16) Incentive spirometer, and flutter valve and Guaifenesin Today's CXR with no interval change, persistent L > R airspace opacities Fungitell is positive, started on Micafungin   Opiate-induced constipation Treating with Relistor   Acute on Chronic Diastolic CHF He had responded well to  several days of p.o. Lasix, currently has no evidence of fluid overload. Holding Lasix at this time Cardiology has been following but will sign off for now Echocardiogram 1/9 with EF of 50-55% with grade 1 diastolic dysfunction    Bacteremia,  factitious Patient grew Staph hominis in 1 culture but other culture negative and repeat cultures negative x 5 days Very likely contaminant   Stage IV Neuroendocrine Tumor Dr. Mosetta Putt consulted on 1/13 and recommended continued antibiotics for now Started on Everolimus (Afinitor) on 12/18, stopped on 1/6 due to poor tolerance He was planning to start bimonthly infusions with Lutathera next Palliative care consulting Dr. Mosetta Putt saw him yesterday and agrees that he is unlikely to survive current illness   Paroxysmal Atrial Fibrillation Continue Flecainide  Continue Eliquis   Stage 3a CKD Baseline creatinine appears to be 1.1-1.3 Appears to be stable at this time Attempt to avoid nephrotoxic medications Recheck BMP in AM    Abnormal LFTs Worsening liver function RUQ [place] with extensive hepatic metastatic disease but no acute findings   Hypertension Hold valsartan/HCTZ If BP is uptrending, can resume hole amlodipine   Diabetes Mellitus Type 2 with Hyperglycemia  HbA1c was 7.9, at goal of <8 based on age Not on home meds Glucose significantly elevated from steroids Added Semglee 10 units + 4 units qAC + resistant-scale SSI with significant improvement   Depression and Anxiety Continue buproprion, buspirone, and mirtazepine   Normocytic Anemia Checked Anemia Panel showed an iron level of 30, UIBC 258, TIBC 288, saturation history of 10%, ferritin level of 84, folate of 4.9 and vitamin B12 level of 888 Started Folic Acid 1 mg po Daily  Appears to be relatively stable Continue to monitor   HLD Continue atorvastatin for now Monitor LFTs, as this may need to be help if liver function worsens   GERD/GI Prophylaxis Continue PPI    Severe malnutrition Nutrition Problem: Severe Malnutrition Etiology: chronic illness Signs/Symptoms: severe fat depletion, severe muscle depletion Interventions: Ensure Enlive (each supplement provides 350kcal and 20 grams of protein), MVI, Magic cup    DNR/GOC Code status changed after palliative consultation on 1/17  Patient has had continued daily deterioration His wife is adamant that we continue to aggressively treat him, but the likelihood for recovery that will be meaningful enough for him to be able to complete cancer treatment is increasingly low Transition to comfort care would be reasonable Family is willing other than his wife, who remains staunchly committed to his recovery Methylnaltrexone and Precedex were added today for comfort       Consultants: Oncology Pulmonology Cardiology Palliative care [facility] team DM coordinator   Procedures: Echocardiogram 1/9   Antibiotics: Azithromycin 1/9-13 Ceftriaxone 1/9-14 Cefepime 1/14- Bactrim 1/14- Vancomycin x 1 Micafungin 1/16-    30 Day Unplanned Readmission Risk Score    Flowsheet Row ED to Hosp-Admission (Current) from 06/07/2023 in [facility]-ICU/STEPDOWN  30 Day Unplanned Readmission Risk Score (%) 21.34 Filed at  0401       This score is the patient's risk of an unplanned readmission within 30 days of being discharged (0 -100%). The score is based on dignosis, age, lab data, medications, orders, and past utilization.   Low:  0-14.9   Medium: 15-21.9   High: 22-29.9   Extreme: 30 and above           Subjective: Increasing WOB, worsening fatigue.   Objective: Vitals:    0600  0721  BP: (!) 111/49   Pulse: 67  Resp: (!) 26   Temp:  97.8 F (36.6 C)  SpO2: 91%     Intake/Output Summary (Last 24 hours) at  0737 Last data filed at  6010 Gross per 24 hour  Intake 1948.15 ml  Output 1500 ml  Net 448.15 ml   Filed Weights   06/17/23 0500 06/18/23 0705  0500  Weight: 87.5 kg 93.4 kg 90.1 kg    Exam:  General:  Appears ill, increasing respiratory distress despite on HFNC O2, +conversational dyspnea, appears more fatigued and moving towards dying Eyes:   normal lids, eyes  mostly closed ENT:  hard of hearing, grossly normal lips & tongue, mmm Neck:  no LAD, masses or thyromegaly Cardiovascular:  RRR, no m/r/g. 2-3+ LE edema, mild hand edema.  Respiratory:   Scattered coarse rhonchi.   Moderately increased respiratory effort on HFNC O2 Abdomen:  soft, NT, +distention Skin:  no rash or induration seen on limited exam Musculoskeletal:   no bony abnormality Psychiatric:  fatigued mood and affect, speech sparse Neurologic:  unable to perform  Data Reviewed: I have reviewed the patient's lab results since admission.  Pertinent labs for today include:   K+ 5.8 Glucose 138 BUN 55/Creatinine 1.23/GFR 57 WBC 18.9     Family Communication: Spoke with many family members at and around bedside  Disposition: Status is: Inpatient Remains inpatient appropriate because: actively dying  Total critical care time: 50 minutes Critical care time was exclusive of separately billable procedures and treating other patients. Critical care was necessary to treat or prevent imminent or life-threatening deterioration. Critical care was time spent personally by me on the following activities: development of treatment plan with patient and/or surrogate as well as nursing, discussions with consultants, evaluation of patient's response to treatment, examination of patient, obtaining history from patient or surrogate, ordering and performing treatments and interventions, ordering and review of laboratory studies, ordering and review of radiographic studies, pulse oximetry and re-evaluation of patient's condition.   Unresulted Labs (From admission, onward)     Start     Ordered   06/15/23 0931  Aspergillus antibody by immunodiff  Once,   R       Question:  Specimen collection method  Answer:  Lab=Lab collect   06/15/23 0930             Author: Jonah Blue, MD  7:37 AM  For on call review [email or web address].

## 2023-06-30 NOTE — Progress Notes (Signed)
NAME:  Corey [name], MRN:  [id number], DOB:  1934/09/24, LOS: 11 ADMISSION DATE:  06/07/2023, CONSULTATION DATE: 06/10/2023 REFERRING MD: Dr. Marland Mcalpine, CHIEF COMPLAINT: Acute hypoxemic respiratory failure  History of Present Illness:  89 year old man with a history of former tobacco, newly diagnosed metastatic neuroendocrine tumor followed by Dr. Mosetta Putt.  Also with atrial fibrillation (now NSR), hypertension, CAD with a reassuring cardiac stress test 2023.  EF 51%.  For his neuroendocrine tumor he has been treated initially with Lutathera (stopped for transaminitis) and then everolimus for 3 weeks (stopped 1/6).  At that visit 1/6 he was found to be hypoxemic and had patchy bilateral pulmonary infiltrates on chest x-ray.  Subsequently seen by Dr. Rennis Golden with cardiology and started on furosemide for possible component of pulmonary edema.  Now admitted 06/07/2023 with progressive dyspnea and cough.  He was started on azithromycin and ceftriaxone for possible community-acquired pneumonia.  Chest x-ray at the time of admission shows progression of left-sided hazy pulmonary infiltrates.  Pertinent  Medical History   Past Medical History:  Diagnosis Date   Basal cell carcinoma of auricle of right ear    BPH (benign prostatic hyperplasia)    DJD (degenerative joint disease)    left   ED (erectile dysfunction)    Fatigue    Hearing loss, sensorineural    Hx of adenomatous colonic polyps    Hx of major depression    Hypertension    Iron deficiency anemia    Nephrolithiasis    Paroxysmal atrial fibrillation (HCC)    Seasonal allergic rhinitis   Metastatic neuroendocrine tumor Atrial fibrillation  Significant Hospital Events: Including procedures, antibiotic start and stop dates in addition to other pertinent events   1/14 increase in O2 requirements overnight, transferred to step down unit on [facility] 1/15 on HHFNC 60L 80% FiO2 1/16 on HHFNC 55L 90% FiO2, tried bipap overnight 1/17 remained on  HHFNC 60L 90% FiO2 1/20 remains on high flow nasal cannula, did have bradycardic episode with Precedex at 0.4 mg on 1/19 1/21 remains on Precedex,  Interim History / Subjective:   Still not able to get comfortable States he feels like he has been run over by a truck Did not admit to being in pain specifically Still with increased work of breathing  Objective   Blood pressure (!) 111/49, pulse 67, temperature 97.8 F (36.6 C), temperature source Axillary, resp. rate (!) 26, height 6\' 1"  (1.854 m), weight 90.1 kg, SpO2 91%.    FiO2 (%):  [75 %-90 %] 90 %   Intake/Output Summary (Last 24 hours) at  4098 Last data filed at  1191 Gross per 24 hour  Intake 1948.15 ml  Output 1500 ml  Net 448.15 ml   Filed Weights   06/17/23 0500 06/18/23 0705  0500  Weight: 87.5 kg 93.4 kg 90.1 kg    Examination: General: Elderly, does appear uncomfortable, groaning HENT: Moist oral mucosa Lungs: Fair air entry, some rales at the bases Cardiovascular: S1-S2 appreciated Abdomen: Abdomen is full, bowel sounds appreciated Extremities: No edema Neuro: Awake, alert, follows commands GU: Deferred  I reviewed nursing notes, hospitalist notes, last 24 h vitals and pain scores, last 48 h intake and output, last 24 h labs and trends, and last 24 h imaging results.  Hyperkalemia has been treated Resolved Hospital Problem list     Assessment & Plan:   Acute hypoxemic respiratory failure with pulmonary infiltrates Concern for pneumonitis related to recent medication versus lymphangitic spread of cancer versus infectious  infiltrate -Did complete course of cefepime -Currently on Bactrim for pneumocystis coverage-context of immunosuppression, elevated LDH, positive Fungitell, significant oxygen need -On micafungin since 1/16 -Remains on prednisone 60 daily -Remains on high flow nasal cannula  Increased work of breathing -On morphine as needed -On Precedex, trying to use  lower doses, moderate dose did cause bradycardia   Did receive Relistor 1/20, 06/17/2023  Appreciate palliative care follow-up -Will continue ongoing goals of care discussions  Staph hominis 1 out of 4 cultures-contaminants  Hypertension Paroxysmal atrial fibrillation Heart failure with preserved ejection fraction -Appreciate cardiology follow-up  Metastatic neuroendocrine tumor -Appreciate Dr. Latanya Maudlin assistance  Hyperkalemia -Being addressed -May be related to Bactrim  Transaminitis -Appears stable -Will continue to follow  Acute kidney injury  Patient not a bronchoscopy candidate -Will not be able to wean off a ventilator  Palliative measures likely the best course for patient at this time  Labs   CBC: Recent Labs  Lab 06/14/23 0251 06/15/23 0257 06/16/23 0311 06/17/23 0256 06/18/23 0223  0012  WBC 18.2* 16.9* 16.5* 17.4* 14.2* 18.9*  NEUTROABS 15.0* 14.4* 14.2* 14.7* 12.1*  --   HGB 12.6* 12.1* 12.2* 12.5* 12.2* 12.4*  HCT 38.6* 38.2* 38.1* 39.2 39.0 38.9*  MCV 88.1 89.3 89.4 90.5 91.8 90.5  PLT 369 328 352 409* 396 383    Basic Metabolic Panel: Recent Labs  Lab 06/13/23 0258 06/14/23 0251 06/15/23 0257 06/16/23 0311 06/17/23 0256 06/18/23 0223 06/18/23 0500  0012  NA 136   < > 136 132* 132* 132*  --  134*  K 3.8   < > 4.3 4.9 5.8* 6.7* 5.6* 5.8*  CL 101   < > 101 98 97* 100  --  102  CO2 22   < > 24 24 26 23   --  24  GLUCOSE 128*   < > 115* 127* 90 145*  --  138*  BUN 50*   < > 52* 48* 48* 60*  --  55*  CREATININE 1.31*   < > 1.36* 1.12 1.25* 1.58*  --  1.23  CALCIUM 8.7*   < > 8.9 9.0 9.0 8.7*  --  8.7*  MG 2.8*  --   --   --   --   --   --  3.1*  PHOS 3.9  --   --   --   --   --   --   --    < > = values in this interval not displayed.   GFR: Estimated Creatinine Clearance: 48.7 mL/min (by C-G formula based on SCr of 1.23 mg/dL). Recent Labs  Lab 06/14/23 1001 06/15/23 0257 06/16/23 0311 06/17/23 0256 06/18/23 0223   0012  PROCALCITON 0.21  --   --  0.19  --   --   WBC  --    < > 16.5* 17.4* 14.2* 18.9*   < > = values in this interval not displayed.    Liver Function Tests: Recent Labs  Lab 06/14/23 0251 06/15/23 0257 06/16/23 0311 06/17/23 0256 06/18/23 0223  AST 69* 105* 62* 52* 47*  ALT 60* 100* 77* 64* 60*  ALKPHOS 351* 409* 408* 405* 389*  BILITOT 0.9 1.1 1.0 0.9 1.1  PROT 6.5 5.9* 6.1* 6.2* 5.9*  ALBUMIN 2.7* 2.5* 2.5* 2.6* 2.4*   No results for input(s): "LIPASE", "AMYLASE" in the last 168 hours.  No results for input(s): "AMMONIA" in the last 168 hours.  ABG    Component Value Date/Time   PHART 7.33 (L)  06/18/2023 0245   PCO2ART 49 (H) 06/18/2023 0245   PO2ART 61 (L) 06/18/2023 0245   HCO3 25.8 06/18/2023 0245   ACIDBASEDEF 0.9 06/18/2023 0245   O2SAT 91 06/18/2023 0245     Coagulation Profile: No results for input(s): "INR", "PROTIME" in the last 168 hours.  Cardiac Enzymes: Recent Labs  Lab 06/14/23 1001  CKTOTAL 139    HbA1C: Hgb A1c MFr Bld  Date/Time Value Ref Range Status  06/12/2023 01:03 PM 7.9 (H) 4.8 - 5.6 % Final    Comment:    (NOTE) Pre diabetes:          5.7%-6.4%  Diabetes:              >6.4%  Glycemic control for   <7.0% adults with diabetes     CBG: Recent Labs  Lab 06/18/23 0222 06/18/23 0452 06/18/23 0752 06/18/23 1144 06/18/23 1658  GLUCAP 131* 186* 131* 192* 206*    The patient is critically ill with multiple organ systems failure and requires high complexity decision making for assessment and support, frequent evaluation and titration of therapies, application of advanced monitoring technologies and extensive interpretation of multiple databases. Critical Care Time devoted to patient care services described in this note independent of APP/resident time (if applicable)  is 31 minutes.   Virl Diamond MD [facility] Personal pager: See Amion If unanswered, please page CCM On-call:  #[phone]

## 2023-06-30 DEATH — deceased

## 2023-07-10 ENCOUNTER — Ambulatory Visit: Payer: Medicare Other | Admitting: Hematology

## 2023-07-10 ENCOUNTER — Other Ambulatory Visit: Payer: Medicare Other

## 2023-07-24 ENCOUNTER — Ambulatory Visit: Payer: Medicare Other | Admitting: Hematology

## 2023-07-24 ENCOUNTER — Other Ambulatory Visit: Payer: Medicare Other

## 2023-07-27 ENCOUNTER — Ambulatory Visit: Payer: Medicare Other | Admitting: Internal Medicine

## 2023-08-09 ENCOUNTER — Other Ambulatory Visit (HOSPITAL_COMMUNITY): Payer: Medicare Other

## 2023-10-04 ENCOUNTER — Other Ambulatory Visit (HOSPITAL_COMMUNITY): Payer: Medicare Other

## 2023-11-29 ENCOUNTER — Encounter (HOSPITAL_COMMUNITY): Payer: Medicare Other
